# Patient Record
Sex: Female | Born: 1945
Health system: Southern US, Community
[De-identification: ages and names within clinical notes are randomized; demographics above are authoritative.]

## PROBLEM LIST (undated history)

## (undated) DIAGNOSIS — K5900 Constipation, unspecified: Secondary | ICD-10-CM

## (undated) DIAGNOSIS — N201 Calculus of ureter: Secondary | ICD-10-CM

## (undated) DIAGNOSIS — T8859XA Other complications of anesthesia, initial encounter: Secondary | ICD-10-CM

## (undated) DIAGNOSIS — F329 Major depressive disorder, single episode, unspecified: Secondary | ICD-10-CM

## (undated) DIAGNOSIS — J3089 Other allergic rhinitis: Secondary | ICD-10-CM

## (undated) DIAGNOSIS — K449 Diaphragmatic hernia without obstruction or gangrene: Secondary | ICD-10-CM

## (undated) DIAGNOSIS — Z800 Family history of malignant neoplasm of digestive organs: Secondary | ICD-10-CM

## (undated) DIAGNOSIS — M1990 Unspecified osteoarthritis, unspecified site: Secondary | ICD-10-CM

## (undated) DIAGNOSIS — J45909 Unspecified asthma, uncomplicated: Secondary | ICD-10-CM

## (undated) DIAGNOSIS — Z973 Presence of spectacles and contact lenses: Secondary | ICD-10-CM

## (undated) DIAGNOSIS — Z9889 Other specified postprocedural states: Secondary | ICD-10-CM

## (undated) DIAGNOSIS — R7303 Prediabetes: Secondary | ICD-10-CM

## (undated) DIAGNOSIS — T7840XA Allergy, unspecified, initial encounter: Secondary | ICD-10-CM

## (undated) DIAGNOSIS — Z87442 Personal history of urinary calculi: Secondary | ICD-10-CM

## (undated) DIAGNOSIS — F419 Anxiety disorder, unspecified: Secondary | ICD-10-CM

## (undated) DIAGNOSIS — F32A Depression, unspecified: Secondary | ICD-10-CM

## (undated) DIAGNOSIS — Z8041 Family history of malignant neoplasm of ovary: Secondary | ICD-10-CM

## (undated) DIAGNOSIS — T4145XA Adverse effect of unspecified anesthetic, initial encounter: Secondary | ICD-10-CM

## (undated) DIAGNOSIS — Z974 Presence of external hearing-aid: Secondary | ICD-10-CM

## (undated) DIAGNOSIS — N200 Calculus of kidney: Secondary | ICD-10-CM

## (undated) DIAGNOSIS — K219 Gastro-esophageal reflux disease without esophagitis: Secondary | ICD-10-CM

## (undated) DIAGNOSIS — R351 Nocturia: Secondary | ICD-10-CM

## (undated) DIAGNOSIS — R112 Nausea with vomiting, unspecified: Secondary | ICD-10-CM

## (undated) HISTORY — PX: DILATION AND CURETTAGE OF UTERUS: SHX78

## (undated) HISTORY — PX: CHOLECYSTECTOMY: SHX55

## (undated) HISTORY — PX: ANTERIOR CERVICAL DECOMP/DISCECTOMY FUSION: SHX1161

## (undated) HISTORY — PX: TONSILLECTOMY: SUR1361

## (undated) HISTORY — PX: SHOULDER SURGERY: SHX246

## (undated) HISTORY — PX: ABDOMINAL HYSTERECTOMY: SHX81

## (undated) HISTORY — PX: ELBOW SURGERY: SHX618

## (undated) HISTORY — PX: KNEE SURGERY: SHX244

## (undated) HISTORY — DX: Family history of malignant neoplasm of ovary: Z80.41

## (undated) HISTORY — PX: OTHER SURGICAL HISTORY: SHX169

## (undated) HISTORY — PX: LAPAROSCOPIC CHOLECYSTECTOMY: SUR755

## (undated) HISTORY — PX: SACROSPINOUS LIGAMENT FIXATION: SHX2371

## (undated) HISTORY — DX: Family history of malignant neoplasm of digestive organs: Z80.0

## (undated) HISTORY — PX: KNEE ARTHROSCOPY: SUR90

---
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
## 1998-08-07 HISTORY — PX: CARPAL TUNNEL RELEASE: SHX101

## 1999-07-28 ENCOUNTER — Other Ambulatory Visit: Admission: RE | Admit: 1999-07-28 | Discharge: 1999-07-28 | Payer: Self-pay | Admitting: Gynecology

## 2000-06-04 ENCOUNTER — Encounter (INDEPENDENT_AMBULATORY_CARE_PROVIDER_SITE_OTHER): Payer: Self-pay | Admitting: Specialist

## 2000-06-04 ENCOUNTER — Ambulatory Visit (HOSPITAL_COMMUNITY): Admission: RE | Admit: 2000-06-04 | Discharge: 2000-06-04 | Payer: Self-pay | Admitting: Gynecology

## 2001-01-14 ENCOUNTER — Other Ambulatory Visit: Admission: RE | Admit: 2001-01-14 | Discharge: 2001-01-14 | Payer: Self-pay | Admitting: Gynecology

## 2002-05-29 ENCOUNTER — Other Ambulatory Visit: Admission: RE | Admit: 2002-05-29 | Discharge: 2002-05-29 | Payer: Self-pay | Admitting: Gynecology

## 2002-07-29 ENCOUNTER — Encounter: Admission: RE | Admit: 2002-07-29 | Discharge: 2002-09-18 | Payer: Self-pay | Admitting: Orthopedic Surgery

## 2003-08-08 HISTORY — PX: OTHER SURGICAL HISTORY: SHX169

## 2003-08-08 HISTORY — PX: WRIST SURGERY: SHX841

## 2003-08-09 ENCOUNTER — Ambulatory Visit (HOSPITAL_COMMUNITY): Admission: RE | Admit: 2003-08-09 | Discharge: 2003-08-09 | Payer: Self-pay | Admitting: Family Medicine

## 2003-09-17 ENCOUNTER — Other Ambulatory Visit: Admission: RE | Admit: 2003-09-17 | Discharge: 2003-09-17 | Payer: Self-pay | Admitting: Gynecology

## 2004-12-26 ENCOUNTER — Other Ambulatory Visit: Admission: RE | Admit: 2004-12-26 | Discharge: 2004-12-26 | Payer: Self-pay | Admitting: Gynecology

## 2005-01-16 ENCOUNTER — Ambulatory Visit (HOSPITAL_COMMUNITY): Admission: RE | Admit: 2005-01-16 | Discharge: 2005-01-16 | Payer: Self-pay | Admitting: Gynecology

## 2005-01-16 ENCOUNTER — Encounter (INDEPENDENT_AMBULATORY_CARE_PROVIDER_SITE_OTHER): Payer: Self-pay | Admitting: *Deleted

## 2005-07-10 ENCOUNTER — Encounter: Admission: RE | Admit: 2005-07-10 | Discharge: 2005-10-08 | Payer: Self-pay | Admitting: Orthopedic Surgery

## 2005-10-09 ENCOUNTER — Encounter: Admission: RE | Admit: 2005-10-09 | Discharge: 2006-01-03 | Payer: Self-pay | Admitting: Orthopedic Surgery

## 2007-04-28 ENCOUNTER — Encounter: Admission: RE | Admit: 2007-04-28 | Discharge: 2007-04-28 | Payer: Self-pay | Admitting: Sports Medicine

## 2007-12-24 ENCOUNTER — Encounter: Admission: RE | Admit: 2007-12-24 | Discharge: 2008-01-15 | Payer: Self-pay | Admitting: Orthopedic Surgery

## 2008-04-09 ENCOUNTER — Encounter: Payer: Self-pay | Admitting: Sports Medicine

## 2008-04-15 ENCOUNTER — Ambulatory Visit: Payer: Self-pay | Admitting: Sports Medicine

## 2008-04-15 DIAGNOSIS — M7750 Other enthesopathy of unspecified foot: Secondary | ICD-10-CM | POA: Insufficient documentation

## 2008-04-15 DIAGNOSIS — M79609 Pain in unspecified limb: Secondary | ICD-10-CM | POA: Insufficient documentation

## 2008-04-16 ENCOUNTER — Encounter: Payer: Self-pay | Admitting: Sports Medicine

## 2008-08-07 HISTORY — PX: CERVICAL DISC SURGERY: SHX588

## 2009-07-19 ENCOUNTER — Encounter: Admission: RE | Admit: 2009-07-19 | Discharge: 2009-08-04 | Payer: Self-pay | Admitting: Chiropractic Medicine

## 2009-07-29 ENCOUNTER — Encounter: Admission: RE | Admit: 2009-07-29 | Discharge: 2009-07-29 | Payer: Self-pay | Admitting: Sports Medicine

## 2009-08-16 ENCOUNTER — Encounter: Admission: RE | Admit: 2009-08-16 | Discharge: 2009-08-16 | Payer: Self-pay | Admitting: Sports Medicine

## 2009-09-01 ENCOUNTER — Encounter: Admission: RE | Admit: 2009-09-01 | Discharge: 2009-09-01 | Payer: Self-pay | Admitting: Sports Medicine

## 2009-10-04 ENCOUNTER — Encounter: Admission: RE | Admit: 2009-10-04 | Discharge: 2009-10-04 | Payer: Self-pay | Admitting: Sports Medicine

## 2010-01-06 ENCOUNTER — Ambulatory Visit (HOSPITAL_COMMUNITY): Admission: RE | Admit: 2010-01-06 | Discharge: 2010-01-07 | Payer: Self-pay | Admitting: Neurosurgery

## 2010-01-10 ENCOUNTER — Encounter: Admission: RE | Admit: 2010-01-10 | Discharge: 2010-01-10 | Payer: Self-pay | Admitting: Orthopedic Surgery

## 2010-10-24 LAB — CBC
HCT: 40 % (ref 36.0–46.0)
Hemoglobin: 13.6 g/dL (ref 12.0–15.0)
MCHC: 34 g/dL (ref 30.0–36.0)
MCV: 89.6 fL (ref 78.0–100.0)
Platelets: 327 10*3/uL (ref 150–400)
RBC: 4.46 MIL/uL (ref 3.87–5.11)
RDW: 13.4 % (ref 11.5–15.5)
WBC: 5.7 10*3/uL (ref 4.0–10.5)

## 2010-10-24 LAB — SURGICAL PCR SCREEN
MRSA, PCR: NEGATIVE
Staphylococcus aureus: NEGATIVE

## 2010-12-23 NOTE — Op Note (Signed)
NAME:  Brenda Delgado, Brenda Delgado                ACCOUNT NO.:  [id number]   MEDICAL RECORD NO.:  [id number]          PATIENT TYPE:  AMB   LOCATION:  SDC                           FACILITY:  [facility]   PHYSICIAN:  Luvenia Redden, M.D.   DATE OF BIRTH:  [birth year]   DATE OF PROCEDURE:  01/16/2005  DATE OF DISCHARGE:                                 OPERATIVE REPORT   PREOPERATIVE DIAGNOSIS:  Multiple sebaceous cysts of the vulva.   POSTOPERATIVE DIAGNOSIS:  Multiple sebaceous cysts of the vulva.   PROCEDURE:  Excision of sebaceous cysts from the vulva bilaterally.   SURGEON:  Luvenia Redden, M.D.   DESCRIPTION OF PROCEDURE:  Under good anesthesia, the patient was prepped  and draped in the usual sterile fashion. She had multiple sebaceous cysts,  the largest was on the left vulva, on the labia majora and then she had two  or three more on the right side, the largest of which was about 1 cm.  The  largest on the left side was at least 2.5 cm.  ON the left side, there was a  total of about four of these things that were incised in toto.  The  subcutaneous tissues were infiltrated with some lidocaine with epinephrine  and the area encompassing the cysts was excised elliptically, carried down  to the subcutaneous tissues, and then this was removed.  Some bleeders were  cauterized on the left side.  There was some electrocoagulation.  The defect  was closed then with interrupted sutures of 4-0 Vicryl.  Attention was then  directed to the right side, where again the vulva was infiltrated.  An area  where the cysts were.  Again elliptical incision to include the area  containing the cysts was made and this area was excised down to the  subcutaneous tissue.  The defect was then closed with interrupted 4-0 Vicryl  sutures.  There were a couple of smaller ones that were just excised from  the bed, cauterized with electrocautery, one on each side, one on the right  and one on the left.  Estimated blood loss was 25  mL or less, none was  replaced.  The patient tolerated the procedure well and she is removed to  the recovery room in good condition.       WSB/MEDQ  D:  01/16/2005  T:  01/16/2005  Job:  [id number]

## 2011-06-12 ENCOUNTER — Other Ambulatory Visit: Payer: Self-pay | Admitting: Gynecology

## 2011-08-28 ENCOUNTER — Encounter (HOSPITAL_COMMUNITY): Payer: Self-pay | Admitting: Pharmacist

## 2011-09-07 ENCOUNTER — Encounter (HOSPITAL_COMMUNITY): Payer: Self-pay

## 2011-09-07 ENCOUNTER — Other Ambulatory Visit: Payer: Self-pay | Admitting: Obstetrics and Gynecology

## 2011-09-07 ENCOUNTER — Other Ambulatory Visit: Payer: Self-pay

## 2011-09-07 ENCOUNTER — Encounter (HOSPITAL_COMMUNITY)
Admission: RE | Admit: 2011-09-07 | Discharge: 2011-09-07 | Disposition: A | Discharge status: Home / Self Care | Payer: Medicare Other | Source: Ambulatory Visit | Attending: Obstetrics and Gynecology | Admitting: Obstetrics and Gynecology

## 2011-09-07 HISTORY — DX: Other specified postprocedural states: R11.2

## 2011-09-07 HISTORY — DX: Other specified postprocedural states: Z98.890

## 2011-09-07 LAB — CBC
MCH: 30.4 pg (ref 26.0–34.0)
MCHC: 32.7 g/dL (ref 30.0–36.0)
MCV: 93 fL (ref 78.0–100.0)
Platelets: 273 10*3/uL (ref 150–400)
RBC: 4.74 MIL/uL (ref 3.87–5.11)

## 2011-09-07 NOTE — Patient Instructions (Addendum)
   Your procedure is scheduled WU:[id number] Feb 4th  Enter through the Main Entrance of [facility] at: 6am Pick up the phone at the desk and dial [phone] and inform us of your arrival.  Please call this number if you have any problems the morning of surgery: [phone]  Remember: Do not eat food after midnight:t Sunday Do not drink clear liquids after:midnight Sunday Take these medicines the morning of surgery with a SIP OF WATER:morning medications  Do not wear jewelry, make-up, or FINGER nail polish Do not wear lotions, powders, perfumes or deodorant. Do not shave 48 hours prior to surgery. Do not bring valuables to the hospital.  Leave suitcase in the car. After Surgery it may be brought to your room. For patients being admitted to the hospital, checkout time is 11:00am the day of discharge.  Patients discharged on the day of surgery will not be allowed to drive home.     Remember to use your hibiclens as instructed.Please shower with 1/2 bottle the evening before your surgery and the other 1/2 bottle the morning of surgery.

## 2011-09-09 NOTE — H&P (Signed)
NAME:  Brenda Delgado, Brenda Delgado NO.:  [id number]  MEDICAL RECORD NO.:  [id number]  LOCATION:  PERIO                         FACILITY:  [facility]  PHYSICIAN:  Malva Limes, M.D.    DATE OF BIRTH:  March 05, 1946  DATE OF ADMISSION:  08/17/2011 DATE OF DISCHARGE:                             HISTORY & PHYSICAL   HISTORY OF PRESENT ILLNESS:  Brenda Delgado is a 66 year old, G2, P2, white female, who presents to [facility] for a total vaginal hysterectomy with bilateral salpingo-oophorectomy and an anterior colporrhaphy secondary to symptomatic uterine prolapse and large cystocele.  The patient states that she has had increasing pelvic pressure over the last 3 years.  The patient also states that she has occasional stress urine incontinence.  She has urinary urgency and nocturia.  Prior to having this procedure performed, the patient did undergo cystometrics during that procedure the patient did not have stress urinary incontinence.  She was found to have bladder irritability and abnormal emptying of the bladder after voiding.  The patient was given a trial of Enablex approximately 2 weeks prior to this procedure which helped with the nocturia and she noticed some improvement in the urge incontinence.  The patient does state that she rarely leaks with Valsalva given the greater urge incontinence, it was felt urethral sling would not be indicated in this patient.  PAST MEDICAL HISTORY:  The patient has an allergy to AUGMENTIN and CODEINE which causes nausea.  She denies smoking, alcohol, or drug use.  CURRENT MEDICATIONS:  Pravastatin and calcium.  PAST SURGERIES:  Cholecystectomy and 2 vaginal births.  FAMILY HISTORY:  Significant for colon carcinoma and  breast carcinoma.  PHYSICAL EXAM:  GENERAL:  The patient is a thin white female in no apparent distress. VITAL SIGNS:  Stable.  She is afebrile. HEENT:  Within normal limits. LUNGS:  Clear to auscultation. CARDIOVASCULAR:   Regular rate and rhythm without murmurs. BREASTS:  Without masses or tenderness.  She is up-to-date on her mammogram and Pap smear.  ABDOMEN:  Soft, nontender, and no organomegaly.  She has no rebound or guarding. PELVIC:  Atrophic external genitalia.  Vagina has a large cystocele, posteriorly  the vagina appeared to be normal.  Cervix is parous. Uterus is small with first-degree prolapse. RECTAL:  Within normal limits.  IMPRESSION: 1. Symptomatic uterine prolapse. 2. Symptomatic cystocele.  PLAN:  Proceed with total vaginal hysterectomy bilateral salpingo- oophorectomy and anterior colporrhaphy.          ______________________________ Malva Limes, M.D.     MA/MEDQ  D:  09/08/2011  T:  09/09/2011  Job:  [id number]

## 2011-09-10 MED ORDER — DEXTROSE 5 % IV SOLN
1.0000 g | INTRAVENOUS | Status: AC
  Administered 2011-09-11: 1 g via INTRAVENOUS
  Filled 2011-09-10: qty 1

## 2011-09-11 ENCOUNTER — Encounter (HOSPITAL_COMMUNITY): Payer: Self-pay | Admitting: Anesthesiology

## 2011-09-11 ENCOUNTER — Ambulatory Visit (HOSPITAL_COMMUNITY): Payer: Medicare Other | Admitting: Anesthesiology

## 2011-09-11 ENCOUNTER — Encounter (HOSPITAL_COMMUNITY)
Admission: RE | Disposition: A | Discharge status: Home / Self Care | Payer: Self-pay | Source: Ambulatory Visit | Attending: Obstetrics and Gynecology

## 2011-09-11 ENCOUNTER — Inpatient Hospital Stay (HOSPITAL_COMMUNITY)
Admission: RE | Admit: 2011-09-11 | Discharge: 2011-09-13 | DRG: 743 | Disposition: A | Discharge status: Home / Self Care | Payer: Medicare Other | Source: Ambulatory Visit | Attending: Obstetrics and Gynecology | Admitting: Obstetrics and Gynecology

## 2011-09-11 ENCOUNTER — Other Ambulatory Visit: Payer: Self-pay | Admitting: Obstetrics and Gynecology

## 2011-09-11 DIAGNOSIS — Z01818 Encounter for other preprocedural examination: Secondary | ICD-10-CM

## 2011-09-11 DIAGNOSIS — N812 Incomplete uterovaginal prolapse: Principal | ICD-10-CM | POA: Diagnosis present

## 2011-09-11 DIAGNOSIS — N8000 Endometriosis of the uterus, unspecified: Secondary | ICD-10-CM | POA: Diagnosis present

## 2011-09-11 DIAGNOSIS — D251 Intramural leiomyoma of uterus: Secondary | ICD-10-CM | POA: Diagnosis present

## 2011-09-11 DIAGNOSIS — D250 Submucous leiomyoma of uterus: Secondary | ICD-10-CM | POA: Diagnosis present

## 2011-09-11 DIAGNOSIS — Z01812 Encounter for preprocedural laboratory examination: Secondary | ICD-10-CM

## 2011-09-11 HISTORY — PX: VULVA /PERINEUM BIOPSY: SHX319

## 2011-09-11 HISTORY — PX: SALPINGOOPHORECTOMY: SHX82

## 2011-09-11 HISTORY — PX: CYSTOCELE REPAIR: SHX163

## 2011-09-11 HISTORY — PX: VAGINAL HYSTERECTOMY: SHX2639

## 2011-09-11 HISTORY — PX: CYSTOSCOPY: SHX5120

## 2011-09-11 LAB — COMPREHENSIVE METABOLIC PANEL
Alkaline Phosphatase: 77 U/L (ref 39–117)
BUN: 13 mg/dL (ref 6–23)
CO2: 31 mEq/L (ref 19–32)
Chloride: 102 mEq/L (ref 96–112)
Creatinine, Ser: 0.58 mg/dL (ref 0.50–1.10)
GFR calc Af Amer: >90 mL/min (ref >90)
GFR calc non Af Amer: >90 mL/min (ref >90)
Glucose, Bld: 114 mg/dL — ABNORMAL HIGH (ref 70–99)
Potassium: 3.9 mEq/L (ref 3.5–5.1)
Total Bilirubin: 0.5 mg/dL (ref 0.3–1.2)

## 2011-09-11 SURGERY — HYSTERECTOMY, VAGINAL
Anesthesia: General | Site: Vulva | Wound class: Clean Contaminated

## 2011-09-11 MED ORDER — ROCURONIUM BROMIDE 100 MG/10ML IV SOLN
INTRAVENOUS | Status: DC | PRN
  Administered 2011-09-11: 40 mg via INTRAVENOUS

## 2011-09-11 MED ORDER — INDIGOTINDISULFONATE SODIUM 8 MG/ML IJ SOLN
INTRAMUSCULAR | Status: AC
  Filled 2011-09-11: qty 5

## 2011-09-11 MED ORDER — DEXAMETHASONE SODIUM PHOSPHATE 4 MG/ML IJ SOLN
INTRAMUSCULAR | Status: DC | PRN
  Administered 2011-09-11: 10 mg via INTRAVENOUS

## 2011-09-11 MED ORDER — NALOXONE HCL 0.4 MG/ML IJ SOLN: 0.4000 mg | INTRAMUSCULAR | Status: DC | PRN

## 2011-09-11 MED ORDER — DIPHENHYDRAMINE HCL 12.5 MG/5ML PO ELIX
12.5000 mg | ORAL_SOLUTION | Freq: Four times a day (QID) | ORAL | Status: DC | PRN
  Filled 2011-09-11: qty 5

## 2011-09-11 MED ORDER — OXYCODONE-ACETAMINOPHEN 5-325 MG PO TABS
1.0000 | ORAL_TABLET | ORAL | Status: DC | PRN
  Administered 2011-09-12 – 2011-09-13 (×5): 1 via ORAL
  Filled 2011-09-11 (×5): qty 1

## 2011-09-11 MED ORDER — LIDOCAINE-EPINEPHRINE 1 %-1:100000 IJ SOLN
INTRAMUSCULAR | Status: DC | PRN
  Administered 2011-09-11: 14 mL

## 2011-09-11 MED ORDER — HYDROMORPHONE HCL PF 1 MG/ML IJ SOLN: 0.2500 mg | INTRAMUSCULAR | Status: DC | PRN

## 2011-09-11 MED ORDER — MIDAZOLAM HCL 5 MG/5ML IJ SOLN
INTRAMUSCULAR | Status: DC | PRN
  Administered 2011-09-11 (×2): 1 mg via INTRAVENOUS

## 2011-09-11 MED ORDER — KETOROLAC TROMETHAMINE 30 MG/ML IJ SOLN
INTRAMUSCULAR | Status: AC
  Filled 2011-09-11: qty 1

## 2011-09-11 MED ORDER — KETOROLAC TROMETHAMINE 30 MG/ML IJ SOLN: 30.0000 mg | Freq: Once | INTRAMUSCULAR | Status: DC

## 2011-09-11 MED ORDER — LIDOCAINE HCL (CARDIAC) 20 MG/ML IV SOLN
INTRAVENOUS | Status: DC | PRN
  Administered 2011-09-11: 50 mg via INTRAVENOUS

## 2011-09-11 MED ORDER — DEXTROSE IN LACTATED RINGERS 5 % IV SOLN
INTRAVENOUS | Status: DC
  Administered 2011-09-11 – 2011-09-12 (×3): via INTRAVENOUS

## 2011-09-11 MED ORDER — ESTRADIOL 0.1 MG/GM VA CREA
TOPICAL_CREAM | VAGINAL | Status: DC | PRN
  Administered 2011-09-11: 1 via VAGINAL

## 2011-09-11 MED ORDER — LIDOCAINE HCL (CARDIAC) 20 MG/ML IV SOLN
INTRAVENOUS | Status: AC
  Filled 2011-09-11: qty 5

## 2011-09-11 MED ORDER — HYDROMORPHONE 0.3 MG/ML IV SOLN
INTRAVENOUS | Status: AC
  Administered 2011-09-12: 0.599 mg via INTRAVENOUS
  Filled 2011-09-11: qty 25

## 2011-09-11 MED ORDER — FENTANYL CITRATE 0.05 MG/ML IJ SOLN
INTRAMUSCULAR | Status: DC | PRN
  Administered 2011-09-11: 150 ug via INTRAVENOUS
  Administered 2011-09-11: 100 ug via INTRAVENOUS

## 2011-09-11 MED ORDER — ONDANSETRON HCL 4 MG/2ML IJ SOLN
INTRAMUSCULAR | Status: DC | PRN
  Administered 2011-09-11: 4 mg via INTRAVENOUS

## 2011-09-11 MED ORDER — PAROXETINE HCL 30 MG PO TABS
15.0000 mg | ORAL_TABLET | Freq: Every day | ORAL | Status: DC
  Administered 2011-09-12 – 2011-09-13 (×2): 15 mg via ORAL
  Filled 2011-09-11 (×4): qty 0.5

## 2011-09-11 MED ORDER — DIPHENHYDRAMINE HCL 50 MG/ML IJ SOLN: 12.5000 mg | Freq: Four times a day (QID) | INTRAMUSCULAR | Status: DC | PRN

## 2011-09-11 MED ORDER — PROPOFOL 10 MG/ML IV EMUL
INTRAVENOUS | Status: DC | PRN
  Administered 2011-09-11: 100 mg via INTRAVENOUS
  Administered 2011-09-11: 20 mg via INTRAVENOUS

## 2011-09-11 MED ORDER — LACTATED RINGERS IV SOLN
INTRAVENOUS | Status: DC
  Administered 2011-09-11: 08:00:00 via INTRAVENOUS
  Administered 2011-09-11 (×2): 125 mL/h via INTRAVENOUS

## 2011-09-11 MED ORDER — HYDROMORPHONE HCL PF 1 MG/ML IJ SOLN
INTRAMUSCULAR | Status: AC
  Filled 2011-09-11: qty 1

## 2011-09-11 MED ORDER — INDIGOTINDISULFONATE SODIUM 8 MG/ML IJ SOLN
INTRAMUSCULAR | Status: DC | PRN
  Administered 2011-09-11: 40 mg via INTRAVENOUS

## 2011-09-11 MED ORDER — HYDROMORPHONE HCL PF 1 MG/ML IJ SOLN
INTRAMUSCULAR | Status: DC | PRN
  Administered 2011-09-11: 1 mg via INTRAVENOUS

## 2011-09-11 MED ORDER — ONDANSETRON HCL 4 MG/2ML IJ SOLN: 4.0000 mg | Freq: Four times a day (QID) | INTRAMUSCULAR | Status: DC | PRN

## 2011-09-11 MED ORDER — PANTOPRAZOLE SODIUM 40 MG PO TBEC
40.0000 mg | DELAYED_RELEASE_TABLET | Freq: Every day | ORAL | Status: DC
  Administered 2011-09-13: 40 mg via ORAL
  Filled 2011-09-11 (×4): qty 1

## 2011-09-11 MED ORDER — DEXAMETHASONE SODIUM PHOSPHATE 10 MG/ML IJ SOLN
INTRAMUSCULAR | Status: AC
  Filled 2011-09-11: qty 1

## 2011-09-11 MED ORDER — ONDANSETRON HCL 4 MG/2ML IJ SOLN
INTRAMUSCULAR | Status: AC
  Filled 2011-09-11: qty 2

## 2011-09-11 MED ORDER — SODIUM CHLORIDE 0.9 % IJ SOLN: 9.0000 mL | INTRAMUSCULAR | Status: DC | PRN

## 2011-09-11 MED ORDER — HYDROMORPHONE 0.3 MG/ML IV SOLN
INTRAVENOUS | Status: DC
  Administered 2011-09-11: 11:00:00 via INTRAVENOUS
  Administered 2011-09-11: 0.67 mL via INTRAVENOUS
  Administered 2011-09-11: 2.67 mg via INTRAVENOUS
  Administered 2011-09-12: 1.33 mL via INTRAVENOUS
  Administered 2011-09-12: 0.67 mL via INTRAVENOUS

## 2011-09-11 MED ORDER — DOCUSATE SODIUM 100 MG PO CAPS
100.0000 mg | ORAL_CAPSULE | Freq: Two times a day (BID) | ORAL | Status: DC
  Administered 2011-09-11 – 2011-09-13 (×4): 100 mg via ORAL
  Filled 2011-09-11 (×4): qty 1

## 2011-09-11 MED ORDER — STERILE WATER FOR IRRIGATION IR SOLN
Status: DC | PRN
  Administered 2011-09-11: 1000 mL via INTRAVESICAL

## 2011-09-11 MED ORDER — ROCURONIUM BROMIDE 50 MG/5ML IV SOLN
INTRAVENOUS | Status: AC
  Filled 2011-09-11: qty 1

## 2011-09-11 MED ORDER — MIDAZOLAM HCL 2 MG/2ML IJ SOLN
INTRAMUSCULAR | Status: AC
  Filled 2011-09-11: qty 2

## 2011-09-11 MED ORDER — FENTANYL CITRATE 0.05 MG/ML IJ SOLN
INTRAMUSCULAR | Status: AC
  Filled 2011-09-11: qty 5

## 2011-09-11 MED ORDER — SIMETHICONE 80 MG PO CHEW: 80.0000 mg | CHEWABLE_TABLET | Freq: Four times a day (QID) | ORAL | Status: DC | PRN

## 2011-09-11 MED ORDER — PROPOFOL 10 MG/ML IV EMUL
INTRAVENOUS | Status: AC
  Filled 2011-09-11: qty 20

## 2011-09-11 MED ORDER — ESTRADIOL 0.1 MG/GM VA CREA
TOPICAL_CREAM | VAGINAL | Status: AC
  Filled 2011-09-11: qty 42.5

## 2011-09-11 SURGICAL SUPPLY — 44 items
BLADE SURG 15 STRL LF C SS BP (BLADE) ×1 IMPLANT
BLADE SURG 15 STRL SS (BLADE) ×6
CANISTER SUCTION 2500CC (MISCELLANEOUS) ×6 IMPLANT
CATH BONANNO SUPRAPUBIC 14G (CATHETERS) IMPLANT
CATH FOLEY 2WAY SLVR  5CC 18FR (CATHETERS)
CATH FOLEY 2WAY SLVR 5CC 18FR (CATHETERS) ×4 IMPLANT
CATH ROBINSON RED A/P 16FR (CATHETERS) IMPLANT
CLOTH BEACON ORANGE TIMEOUT ST (SAFETY) ×6 IMPLANT
CONT PATH 16OZ SNAP LID 3702 (MISCELLANEOUS) ×2 IMPLANT
CONTAINER PREFILL 10% NBF 60ML (FORM) ×6 IMPLANT
DECANTER SPIKE VIAL GLASS SM (MISCELLANEOUS) ×2 IMPLANT
DRAPE HYSTEROSCOPY (DRAPE) ×6 IMPLANT
GAUZE PACKING 2X5 YD STERILE (GAUZE/BANDAGES/DRESSINGS) ×2 IMPLANT
GAUZE PACKING IODOFORM 2 (PACKING) IMPLANT
GLOVE ECLIPSE 7.0 STRL STRAW (GLOVE) ×12 IMPLANT
GOWN PREVENTION PLUS LG XLONG (DISPOSABLE) ×18 IMPLANT
GOWN PREVENTION PLUS XLARGE (GOWN DISPOSABLE) ×6 IMPLANT
GOWN STRL REIN XL XLG (GOWN DISPOSABLE) ×6 IMPLANT
NDL SPNL 18GX3.5 QUINCKE PK (NEEDLE) ×4 IMPLANT
NDL SPNL 22GX3.5 QUINCKE BK (NEEDLE) IMPLANT
NEEDLE HYPO 22GX1.5 SAFETY (NEEDLE) IMPLANT
NEEDLE SPNL 18GX3.5 QUINCKE PK (NEEDLE) ×6 IMPLANT
NEEDLE SPNL 22GX3.5 QUINCKE BK (NEEDLE) IMPLANT
NS IRRIG 1000ML POUR BTL (IV SOLUTION) ×6 IMPLANT
PACK VAGINAL WOMENS (CUSTOM PROCEDURE TRAY) ×6 IMPLANT
SET CYSTO W/LG BORE CLAMP LF (SET/KITS/TRAYS/PACK) ×6 IMPLANT
SPONGE LAP 4X18 X RAY DECT (DISPOSABLE) ×6 IMPLANT
SUT MNCRL 0 MO-4 VIOLET 18 CR (SUTURE) ×15 IMPLANT
SUT MNCRL 0 VIOLET 6X18 (SUTURE) ×5 IMPLANT
SUT MONOCRYL 0 6X18 (SUTURE) ×1
SUT MONOCRYL 0 MO 4 18  CR/8 (SUTURE) ×3
SUT VIC AB 0 CT1 27 (SUTURE) ×12
SUT VIC AB 0 CT1 27XBRD ANBCTR (SUTURE) ×10 IMPLANT
SUT VIC AB 2-0 CT1 27 (SUTURE) ×24
SUT VIC AB 2-0 CT1 TAPERPNT 27 (SUTURE) ×16 IMPLANT
SUT VIC AB 2-0 CTB1 (SUTURE) ×6 IMPLANT
SUT VIC AB 2-0 UR5 27 (SUTURE) IMPLANT
SUT VIC AB 2-0 UR6 27 (SUTURE) IMPLANT
SUT VICRYL 0 UR6 27IN ABS (SUTURE) IMPLANT
SUT VICRYL RAPIDE 3 0 (SUTURE) IMPLANT
SYR 20CC LL (SYRINGE) ×6 IMPLANT
TOWEL OR 17X24 6PK STRL BLUE (TOWEL DISPOSABLE) ×12 IMPLANT
TRAY FOLEY CATH 14FR (SET/KITS/TRAYS/PACK) ×6 IMPLANT
WATER STERILE IRR 1000ML POUR (IV SOLUTION) ×4 IMPLANT

## 2011-09-11 NOTE — Anesthesia Preprocedure Evaluation (Signed)
Anesthesia Evaluation  Patient identified by MRN, date of birth, ID band Patient awake    Reviewed: Allergy & Precautions, H&P , Patient's Chart, lab work & pertinent test results, reviewed documented beta blocker date and time   History of Anesthesia Complications (+) PONV  Airway Mallampati: II TM Distance: >3 FB Neck ROM: full    Dental No notable dental hx. (+)    Pulmonary  clear to auscultation  Pulmonary exam normal       Cardiovascular regular Normal    Neuro/Psych    GI/Hepatic GERD-  Medicated and Controlled,  Endo/Other    Renal/GU      Musculoskeletal   Abdominal   Peds  Hematology   Anesthesia Other Findings   Reproductive/Obstetrics                           Anesthesia Physical Anesthesia Plan  ASA: II  Anesthesia Plan: General   Post-op Pain Management:    Induction: Intravenous  Airway Management Planned: Oral ETT  Additional Equipment:   Intra-op Plan:   Post-operative Plan:   Informed Consent: I have reviewed the patients History and Physical, chart, labs and discussed the procedure including the risks, benefits and alternatives for the proposed anesthesia with the patient or authorized representative who has indicated his/her understanding and acceptance.   Dental Advisory Given and Dental advisory given  Plan Discussed with: CRNA and Surgeon  Anesthesia Plan Comments: (  Discussed  general anesthesia, including possible nausea, instrumentation of airway, sore throat,pulmonary aspiration, etc. I asked if the were any outstanding questions, or  concerns before we proceeded. )        Anesthesia Quick Evaluation

## 2011-09-11 NOTE — Anesthesia Postprocedure Evaluation (Signed)
  Anesthesia Post-op Note  Patient: Brenda Delgado  Procedure(s) Performed:  HYSTERECTOMY VAGINAL; SALPINGO OOPHERECTOMY; ANTERIOR REPAIR (CYSTOCELE); VULVAR BIOPSY - sebaceous cyst excision left vula; CYSTOSCOPY  Patient Location: [facility]  Anesthesia Type: General  Level of Consciousness: awake, oriented and patient cooperative  Airway and Oxygen Therapy: Patient Spontanous Breathing  Post-op Pain: mild  Post-op Assessment: Patient's Cardiovascular Status Stable, Respiratory Function Stable, No signs of Nausea or vomiting, Adequate PO intake and Pain level controlled  Post-op Vital Signs: Reviewed and stable  Complications: No apparent anesthesia complications

## 2011-09-11 NOTE — Transfer of Care (Signed)
Immediate Anesthesia Transfer of Care Note  Patient: Brenda Delgado  Procedure(s) Performed:  HYSTERECTOMY VAGINAL; SALPINGO OOPHERECTOMY; ANTERIOR REPAIR (CYSTOCELE); VULVAR BIOPSY - sebaceous cyst excision left vula; CYSTOSCOPY  Patient Location: PACU  Anesthesia Type: General  Level of Consciousness: awake and sedated  Airway & Oxygen Therapy: Patient Spontanous Breathing and Patient connected to nasal cannula oxygen  Post-op Assessment: Report given to PACU RN and Post -op Vital signs reviewed and stable  Post vital signs: Reviewed and stable  Complications: No apparent anesthesia complications

## 2011-09-11 NOTE — Addendum Note (Signed)
Addendum  created 09/11/11 1731 by Suella Grove, CRNA   Modules edited:Notes Section

## 2011-09-11 NOTE — Progress Notes (Signed)
Discussed surgery with patient. No change is history or PE.

## 2011-09-11 NOTE — Anesthesia Procedure Notes (Signed)
Procedure Name: Intubation Date/Time: 09/11/2011 7:32 AM Performed by: Isabella Bowens Pre-anesthesia Checklist: Patient identified, Emergency Drugs available, Suction available, Patient being monitored and Timeout performed Patient Re-evaluated:Patient Re-evaluated prior to inductionOxygen Delivery Method: Circle System Utilized Preoxygenation: Pre-oxygenation with 100% oxygen Intubation Type: IV induction Ventilation: Mask ventilation without difficulty Laryngoscope Size: Mac and 3 Grade View: Grade II Tube size: 7.0 mm Number of attempts: 1 Airway Equipment and Method: stylet Placement Confirmation: ETT inserted through vocal cords under direct vision,  positive ETCO2,  CO2 detector and breath sounds checked- equal and bilateral Secured at: 20 cm Tube secured with: Tape Dental Injury: Teeth and Oropharynx as per pre-operative assessment  Difficulty Due To: Difficulty was unanticipated

## 2011-09-11 NOTE — Anesthesia Postprocedure Evaluation (Signed)
Anesthesia Post Note  Patient: Brenda Delgado  Procedure(s) Performed:  HYSTERECTOMY VAGINAL; SALPINGO OOPHERECTOMY; ANTERIOR REPAIR (CYSTOCELE); VULVAR BIOPSY - sebaceous cyst excision left vula; CYSTOSCOPY  Anesthesia type: General  Patient location: PACU  Post pain: Pain level controlled  Post assessment: Post-op Vital signs reviewed  Last Vitals:  Filed Vitals:   09/11/11 0917  BP: 133/61  Pulse: 93  Temp: 37.1 C  Resp: 12    Post vital signs: Reviewed  Level of consciousness: sedated  Complications: No apparent anesthesia complicationsfj

## 2011-09-12 ENCOUNTER — Encounter (HOSPITAL_COMMUNITY): Payer: Self-pay | Admitting: Obstetrics and Gynecology

## 2011-09-12 LAB — CBC
HCT: 37.7 % (ref 36.0–46.0)
MCV: 93.1 fL (ref 78.0–100.0)
Platelets: 264 10*3/uL (ref 150–400)
RBC: 4.05 MIL/uL (ref 3.87–5.11)
WBC: 8.7 10*3/uL (ref 4.0–10.5)

## 2011-09-12 MED ORDER — BETHANECHOL CHLORIDE 10 MG PO TABS
10.0000 mg | ORAL_TABLET | Freq: Three times a day (TID) | ORAL | Status: DC
  Administered 2011-09-12 – 2011-09-13 (×2): 10 mg via ORAL
  Filled 2011-09-12 (×6): qty 1

## 2011-09-12 NOTE — Op Note (Signed)
Brenda Delgado, Brenda Delgado NO.:  [id number]  MEDICAL RECORD NO.:  [id number]  LOCATION:  9320                          FACILITY:  [facility]  PHYSICIAN:  Malva Limes, M.D.    DATE OF BIRTH:  1945/08/17  DATE OF PROCEDURE:  09/11/2011 DATE OF DISCHARGE:                              OPERATIVE REPORT   PREOPERATIVE DIAGNOSES: 1. Primary uterine prolapse 2. Symptomatic cystocele.  POSTOPERATIVE DIAGNOSES: 1. Primary uterine prolapse. 2. Symptomatic cystocele.  PROCEDURE: 1. Total vaginal hysterectomy with bilateral salpingo-oophorectomy. 2. Anterior colporrhaphy. 3. Kelly plication. 4. Cystoscopy.  SURGEON:  Malva Limes, M.D.  ASSISTANT:  Luvenia Redden, M.D.  ANESTHESIA:  General.  ANTIBIOTICS:  Cefotan 1 g.  DRAINS:  Foley bedside drainage.  ESTIMATED BLOOD LOSS:  100 mL.  COMPLICATIONS:  None.  SPECIMENS:  Cervix, uterus, fallopian tubes, and ovaries sent to pathology.  PROCEDURE:  The patient was taken to the operating room where general anesthetic was administered without difficulty.  She was placed in dorsal lithotomy position.  She was prepped and draped in the usual fashion for this procedure and exam under anesthesia revealed a large cystocele, small uterus.  No pelvic masses.  At this point, the Foley catheter was placed in the bladder.  A weighted speculum was placed in the vagina.  A 20 mL of 1% lidocaine was injected circumferentially around the cervix.  At this point, the posterior cul-de-sac was entered sharply.  The uterosacral ligaments were bilaterally clamped, cut, and ligated with 0 Monocryl suture.  The cervix was circumscribed.  The anterior cul-de-sac was entered sharply.  Bladder pillars were bilaterally clamped, cut, and ligated with 0 Monocryl suture.  The cardinal ligaments were serially clamped, cut, and ligated with 0 Monocryl suture.  The uterine vessels were bilaterally clamped, cut, and ligated with 0 Monocryl suture.   Next the round ligament, fallopian tube, and ovary were bilaterally clamped, cut, and ligated x2 with 0 Monocryl suture.  Next ovaries and fallopian tubes were assessed.  It was felt that these could be removed vaginally.  A clamp was placed superior to the ovary on the infundibulopelvic ligament.  The ovary and fallopian tube removed.  This pedicle was doubly ligated with 0 Monocryl suture.  A similar procedure was performed on the opposite side.  At this point, all pedicles were checked and felt to be hemostatic.  The posterior cuff was then run between the uterosacral ligaments using 2-0 Vicryl in a running, locking fashion.  Uterosacral suspension suture was placed at this point, but not ligated.  At this point, the anterior vaginal cuff was grasped.  The anterior vaginal wall was injected with 1% lidocaine with epinephrine.  The anterior vaginal wall was opened vertically from the top of the cuff to the mid urethra.  The large cystocele was dissected away from the vaginal mucosa.  At this point, large cystocele was reduced using 2-0 Vicryl and a pursestring suture. Following this, small Kelly plication was performed.  The excess vaginal mucosa was then removed.  The vaginal cuff was then closed using 2-0 Vicryl in a running, locking fashion.  The remaining vaginal cuff was then closed using 2-0 Vicryl in  a running, locking fashion.  Cuff was closed vertically.  The vagina was then packed using plain gauze with Estrace cream.  At this point, cystoscopy was performed.  Bladder appeared to have no foreign materials in the wall.  The ureters were both seen with dye coming from both ureters.  The cystoscope was then removed and the Foley catheter replaced.  The patient was awakened and taken to recovery room in stable condition.  Instrument and lap count were correct x2.          ______________________________ Malva Limes, M.D.     MA/MEDQ  D:  09/11/2011  T:  09/12/2011  Job:   [id number]

## 2011-09-12 NOTE — Progress Notes (Signed)
UR Chart review completed.  

## 2011-09-13 MED ORDER — OXYCODONE-ACETAMINOPHEN 5-325 MG PO TABS: 1.0000 | ORAL_TABLET | ORAL | Status: AC | PRN

## 2011-09-13 NOTE — Progress Notes (Signed)
Pt ambulated out with husband  Teaching complete  Voiding well witout problems

## 2011-09-13 NOTE — Progress Notes (Signed)
POD#2 Pt had large PVRs yesterday.  Was given Urecholine. All PVRs today are less than 25cc. Pt tolerating diet, ambulating. VSSAF Imp/ Doing well Plan/ Will discharge to home. FU in 2 weeks. Percocet prn

## 2011-09-14 NOTE — Discharge Summary (Signed)
Brenda Delgado, Brenda Delgado NO.:  [id number]  MEDICAL RECORD NO.:  [id number]  LOCATION:  9320                          FACILITY:  [facility]  PHYSICIAN:  Malva Limes, M.D.    DATE OF BIRTH:  07/23/1946  DATE OF ADMISSION:  09/11/2011 DATE OF DISCHARGE:  09/13/2011                              DISCHARGE SUMMARY   PREOPERATIVE DIAGNOSES: 1. Symptomatic uterine prolapse. 2. Cystocele.  PRINCIPLE PROCEDURES: 1. Total vaginal hysterectomy with bilateral salpingo-oophorectomy. 2. Anterior colporrhaphy. 3. Kelly plication. 4. Cystoscopy.  HISTORY OF PRESENT ILLNESS:  [name] is a 66 year old white female, who presented to [facility] on September 11, 2011 to undergo a total vaginal hysterectomy with bilateral salpingo-oophorectomy and anterior colporrhaphy, secondary to worsening symptomatic uterine prolapse. Prior to having a procedure performed, the patient did undergo a cystometric study, which indicated the patient had urge incontinence. Despite multiple attempts, we could not elicit stress urinary incontinence.  Prior to hospitalization, the patient had been taking Enablex, which she stated had improved some of her nocturia and frequency.  HOSPITAL COURSE:  The patient underwent a total vaginal hysterectomy with bilateral salpingo-oophorectomy, anterior colporrhaphy, and Kelly plication.  A complete description of this can be found in dictated operative note.  The patient did well postoperatively.  She remained afebrile.  She was eating a regular diet, and ambulating without difficulty.  The patient initially had a large postvoid residuals on postop day #1, however, she was given some Urecholine and this improved. At the time of discharge, her postvoid residuals were less than 25 mL. The patient was discharged to home.  She was sent home with Percocet to take p.r.n.  She was told to follow up in the office in 2 weeks.  She was told to call the office with any  vaginal bleeding, fever, chills, difficulty voiding, or severe pain.          ______________________________ Malva Limes, M.D.     MA/MEDQ  D:  09/13/2011  T:  09/14/2011  Job:  [id number]

## 2011-10-11 ENCOUNTER — Other Ambulatory Visit: Payer: Self-pay | Admitting: Neurosurgery

## 2011-10-11 DIAGNOSIS — M542 Cervicalgia: Secondary | ICD-10-CM

## 2011-10-11 DIAGNOSIS — M47812 Spondylosis without myelopathy or radiculopathy, cervical region: Secondary | ICD-10-CM

## 2011-10-11 DIAGNOSIS — M5030 Other cervical disc degeneration, unspecified cervical region: Secondary | ICD-10-CM

## 2011-10-11 DIAGNOSIS — M5412 Radiculopathy, cervical region: Secondary | ICD-10-CM

## 2011-10-12 ENCOUNTER — Ambulatory Visit
Admission: RE | Admit: 2011-10-12 | Discharge: 2011-10-12 | Disposition: A | Discharge status: Home / Self Care | Payer: Medicare Other | Source: Ambulatory Visit | Attending: Neurosurgery | Admitting: Neurosurgery

## 2011-10-12 DIAGNOSIS — M5412 Radiculopathy, cervical region: Secondary | ICD-10-CM

## 2011-10-12 DIAGNOSIS — M542 Cervicalgia: Secondary | ICD-10-CM

## 2011-10-12 DIAGNOSIS — M47812 Spondylosis without myelopathy or radiculopathy, cervical region: Secondary | ICD-10-CM

## 2011-10-12 DIAGNOSIS — M5030 Other cervical disc degeneration, unspecified cervical region: Secondary | ICD-10-CM

## 2012-01-02 ENCOUNTER — Other Ambulatory Visit: Payer: Self-pay | Admitting: Orthopedic Surgery

## 2012-01-02 DIAGNOSIS — M25561 Pain in right knee: Secondary | ICD-10-CM

## 2012-01-03 ENCOUNTER — Ambulatory Visit
Admission: RE | Admit: 2012-01-03 | Discharge: 2012-01-03 | Disposition: A | Discharge status: Home / Self Care | Payer: Medicare Other | Source: Ambulatory Visit | Attending: Orthopedic Surgery | Admitting: Orthopedic Surgery

## 2012-01-03 DIAGNOSIS — M25561 Pain in right knee: Secondary | ICD-10-CM

## 2016-05-23 ENCOUNTER — Other Ambulatory Visit: Payer: Self-pay | Admitting: Neurosurgery

## 2016-05-23 DIAGNOSIS — G44319 Acute post-traumatic headache, not intractable: Secondary | ICD-10-CM

## 2016-05-25 ENCOUNTER — Ambulatory Visit
Admission: RE | Admit: 2016-05-25 | Discharge: 2016-05-25 | Disposition: A | Discharge status: Home / Self Care | Payer: Medicare Other | Source: Ambulatory Visit | Attending: Neurosurgery | Admitting: Neurosurgery

## 2016-05-25 DIAGNOSIS — G44319 Acute post-traumatic headache, not intractable: Secondary | ICD-10-CM

## 2016-08-07 HISTORY — PX: OTHER SURGICAL HISTORY: SHX169

## 2017-08-08 DIAGNOSIS — R7303 Prediabetes: Secondary | ICD-10-CM | POA: Diagnosis not present

## 2017-08-08 DIAGNOSIS — F419 Anxiety disorder, unspecified: Secondary | ICD-10-CM | POA: Diagnosis not present

## 2017-08-08 DIAGNOSIS — E785 Hyperlipidemia, unspecified: Secondary | ICD-10-CM | POA: Diagnosis not present

## 2017-08-08 DIAGNOSIS — Z1211 Encounter for screening for malignant neoplasm of colon: Secondary | ICD-10-CM | POA: Diagnosis not present

## 2017-08-16 DIAGNOSIS — M7541 Impingement syndrome of right shoulder: Secondary | ICD-10-CM | POA: Diagnosis not present

## 2017-08-16 DIAGNOSIS — S46011A Strain of muscle(s) and tendon(s) of the rotator cuff of right shoulder, initial encounter: Secondary | ICD-10-CM | POA: Diagnosis not present

## 2017-08-16 DIAGNOSIS — S43431A Superior glenoid labrum lesion of right shoulder, initial encounter: Secondary | ICD-10-CM | POA: Diagnosis not present

## 2017-08-16 DIAGNOSIS — M75121 Complete rotator cuff tear or rupture of right shoulder, not specified as traumatic: Secondary | ICD-10-CM | POA: Diagnosis not present

## 2017-08-16 DIAGNOSIS — M66321 Spontaneous rupture of flexor tendons, right upper arm: Secondary | ICD-10-CM | POA: Diagnosis not present

## 2017-08-16 DIAGNOSIS — M24111 Other articular cartilage disorders, right shoulder: Secondary | ICD-10-CM | POA: Diagnosis not present

## 2017-08-16 DIAGNOSIS — G8918 Other acute postprocedural pain: Secondary | ICD-10-CM | POA: Diagnosis not present

## 2017-08-23 DIAGNOSIS — J301 Allergic rhinitis due to pollen: Secondary | ICD-10-CM | POA: Diagnosis not present

## 2017-08-23 DIAGNOSIS — J3081 Allergic rhinitis due to animal (cat) (dog) hair and dander: Secondary | ICD-10-CM | POA: Diagnosis not present

## 2017-08-23 DIAGNOSIS — J3089 Other allergic rhinitis: Secondary | ICD-10-CM | POA: Diagnosis not present

## 2017-08-27 DIAGNOSIS — J3089 Other allergic rhinitis: Secondary | ICD-10-CM | POA: Diagnosis not present

## 2017-08-27 DIAGNOSIS — J301 Allergic rhinitis due to pollen: Secondary | ICD-10-CM | POA: Diagnosis not present

## 2017-08-27 DIAGNOSIS — J3081 Allergic rhinitis due to animal (cat) (dog) hair and dander: Secondary | ICD-10-CM | POA: Diagnosis not present

## 2017-08-29 DIAGNOSIS — J3081 Allergic rhinitis due to animal (cat) (dog) hair and dander: Secondary | ICD-10-CM | POA: Diagnosis not present

## 2017-08-29 DIAGNOSIS — J301 Allergic rhinitis due to pollen: Secondary | ICD-10-CM | POA: Diagnosis not present

## 2017-08-29 DIAGNOSIS — M7541 Impingement syndrome of right shoulder: Secondary | ICD-10-CM | POA: Diagnosis not present

## 2017-08-29 DIAGNOSIS — J3089 Other allergic rhinitis: Secondary | ICD-10-CM | POA: Diagnosis not present

## 2017-08-29 DIAGNOSIS — S46011D Strain of muscle(s) and tendon(s) of the rotator cuff of right shoulder, subsequent encounter: Secondary | ICD-10-CM | POA: Diagnosis not present

## 2017-08-30 DIAGNOSIS — M5415 Radiculopathy, thoracolumbar region: Secondary | ICD-10-CM | POA: Diagnosis not present

## 2017-08-30 DIAGNOSIS — M9902 Segmental and somatic dysfunction of thoracic region: Secondary | ICD-10-CM | POA: Diagnosis not present

## 2017-08-30 DIAGNOSIS — M5413 Radiculopathy, cervicothoracic region: Secondary | ICD-10-CM | POA: Diagnosis not present

## 2017-08-30 DIAGNOSIS — M9901 Segmental and somatic dysfunction of cervical region: Secondary | ICD-10-CM | POA: Diagnosis not present

## 2017-09-03 DIAGNOSIS — J3081 Allergic rhinitis due to animal (cat) (dog) hair and dander: Secondary | ICD-10-CM | POA: Diagnosis not present

## 2017-09-03 DIAGNOSIS — J301 Allergic rhinitis due to pollen: Secondary | ICD-10-CM | POA: Diagnosis not present

## 2017-09-03 DIAGNOSIS — J3089 Other allergic rhinitis: Secondary | ICD-10-CM | POA: Diagnosis not present

## 2017-09-05 DIAGNOSIS — J301 Allergic rhinitis due to pollen: Secondary | ICD-10-CM | POA: Diagnosis not present

## 2017-09-05 DIAGNOSIS — J3089 Other allergic rhinitis: Secondary | ICD-10-CM | POA: Diagnosis not present

## 2017-09-05 DIAGNOSIS — J3081 Allergic rhinitis due to animal (cat) (dog) hair and dander: Secondary | ICD-10-CM | POA: Diagnosis not present

## 2017-09-12 DIAGNOSIS — J3089 Other allergic rhinitis: Secondary | ICD-10-CM | POA: Diagnosis not present

## 2017-09-12 DIAGNOSIS — J301 Allergic rhinitis due to pollen: Secondary | ICD-10-CM | POA: Diagnosis not present

## 2017-09-12 DIAGNOSIS — J3081 Allergic rhinitis due to animal (cat) (dog) hair and dander: Secondary | ICD-10-CM | POA: Diagnosis not present

## 2017-09-13 DIAGNOSIS — M5413 Radiculopathy, cervicothoracic region: Secondary | ICD-10-CM | POA: Diagnosis not present

## 2017-09-13 DIAGNOSIS — M5415 Radiculopathy, thoracolumbar region: Secondary | ICD-10-CM | POA: Diagnosis not present

## 2017-09-13 DIAGNOSIS — M9901 Segmental and somatic dysfunction of cervical region: Secondary | ICD-10-CM | POA: Diagnosis not present

## 2017-09-13 DIAGNOSIS — M9902 Segmental and somatic dysfunction of thoracic region: Secondary | ICD-10-CM | POA: Diagnosis not present

## 2017-09-14 DIAGNOSIS — J301 Allergic rhinitis due to pollen: Secondary | ICD-10-CM | POA: Diagnosis not present

## 2017-09-18 DIAGNOSIS — J301 Allergic rhinitis due to pollen: Secondary | ICD-10-CM | POA: Diagnosis not present

## 2017-09-18 DIAGNOSIS — J3081 Allergic rhinitis due to animal (cat) (dog) hair and dander: Secondary | ICD-10-CM | POA: Diagnosis not present

## 2017-09-18 DIAGNOSIS — J3089 Other allergic rhinitis: Secondary | ICD-10-CM | POA: Diagnosis not present

## 2017-09-20 DIAGNOSIS — J301 Allergic rhinitis due to pollen: Secondary | ICD-10-CM | POA: Diagnosis not present

## 2017-09-20 DIAGNOSIS — J3089 Other allergic rhinitis: Secondary | ICD-10-CM | POA: Diagnosis not present

## 2017-09-20 DIAGNOSIS — J3081 Allergic rhinitis due to animal (cat) (dog) hair and dander: Secondary | ICD-10-CM | POA: Diagnosis not present

## 2017-09-25 DIAGNOSIS — J3089 Other allergic rhinitis: Secondary | ICD-10-CM | POA: Diagnosis not present

## 2017-09-25 DIAGNOSIS — J3081 Allergic rhinitis due to animal (cat) (dog) hair and dander: Secondary | ICD-10-CM | POA: Diagnosis not present

## 2017-09-25 DIAGNOSIS — J301 Allergic rhinitis due to pollen: Secondary | ICD-10-CM | POA: Diagnosis not present

## 2017-10-02 DIAGNOSIS — M25611 Stiffness of right shoulder, not elsewhere classified: Secondary | ICD-10-CM | POA: Diagnosis not present

## 2017-10-02 DIAGNOSIS — M6281 Muscle weakness (generalized): Secondary | ICD-10-CM | POA: Diagnosis not present

## 2017-10-02 DIAGNOSIS — M25511 Pain in right shoulder: Secondary | ICD-10-CM | POA: Diagnosis not present

## 2017-10-08 DIAGNOSIS — M5415 Radiculopathy, thoracolumbar region: Secondary | ICD-10-CM | POA: Diagnosis not present

## 2017-10-08 DIAGNOSIS — M9901 Segmental and somatic dysfunction of cervical region: Secondary | ICD-10-CM | POA: Diagnosis not present

## 2017-10-08 DIAGNOSIS — M25611 Stiffness of right shoulder, not elsewhere classified: Secondary | ICD-10-CM | POA: Diagnosis not present

## 2017-10-08 DIAGNOSIS — M6281 Muscle weakness (generalized): Secondary | ICD-10-CM | POA: Diagnosis not present

## 2017-10-08 DIAGNOSIS — M25511 Pain in right shoulder: Secondary | ICD-10-CM | POA: Diagnosis not present

## 2017-10-08 DIAGNOSIS — M5413 Radiculopathy, cervicothoracic region: Secondary | ICD-10-CM | POA: Diagnosis not present

## 2017-10-08 DIAGNOSIS — M9902 Segmental and somatic dysfunction of thoracic region: Secondary | ICD-10-CM | POA: Diagnosis not present

## 2017-10-10 DIAGNOSIS — J3089 Other allergic rhinitis: Secondary | ICD-10-CM | POA: Diagnosis not present

## 2017-10-10 DIAGNOSIS — J3081 Allergic rhinitis due to animal (cat) (dog) hair and dander: Secondary | ICD-10-CM | POA: Diagnosis not present

## 2017-10-10 DIAGNOSIS — J301 Allergic rhinitis due to pollen: Secondary | ICD-10-CM | POA: Diagnosis not present

## 2017-10-11 DIAGNOSIS — M25611 Stiffness of right shoulder, not elsewhere classified: Secondary | ICD-10-CM | POA: Diagnosis not present

## 2017-10-11 DIAGNOSIS — M6281 Muscle weakness (generalized): Secondary | ICD-10-CM | POA: Diagnosis not present

## 2017-10-11 DIAGNOSIS — M25511 Pain in right shoulder: Secondary | ICD-10-CM | POA: Diagnosis not present

## 2017-10-16 DIAGNOSIS — M6281 Muscle weakness (generalized): Secondary | ICD-10-CM | POA: Diagnosis not present

## 2017-10-16 DIAGNOSIS — M25611 Stiffness of right shoulder, not elsewhere classified: Secondary | ICD-10-CM | POA: Diagnosis not present

## 2017-10-16 DIAGNOSIS — M25511 Pain in right shoulder: Secondary | ICD-10-CM | POA: Diagnosis not present

## 2017-10-18 DIAGNOSIS — M6281 Muscle weakness (generalized): Secondary | ICD-10-CM | POA: Diagnosis not present

## 2017-10-18 DIAGNOSIS — M25511 Pain in right shoulder: Secondary | ICD-10-CM | POA: Diagnosis not present

## 2017-10-18 DIAGNOSIS — M25611 Stiffness of right shoulder, not elsewhere classified: Secondary | ICD-10-CM | POA: Diagnosis not present

## 2017-10-23 DIAGNOSIS — M25511 Pain in right shoulder: Secondary | ICD-10-CM | POA: Diagnosis not present

## 2017-10-23 DIAGNOSIS — M25611 Stiffness of right shoulder, not elsewhere classified: Secondary | ICD-10-CM | POA: Diagnosis not present

## 2017-10-23 DIAGNOSIS — M6281 Muscle weakness (generalized): Secondary | ICD-10-CM | POA: Diagnosis not present

## 2017-10-24 DIAGNOSIS — J3081 Allergic rhinitis due to animal (cat) (dog) hair and dander: Secondary | ICD-10-CM | POA: Diagnosis not present

## 2017-10-24 DIAGNOSIS — J301 Allergic rhinitis due to pollen: Secondary | ICD-10-CM | POA: Diagnosis not present

## 2017-10-24 DIAGNOSIS — J3089 Other allergic rhinitis: Secondary | ICD-10-CM | POA: Diagnosis not present

## 2017-10-25 DIAGNOSIS — M25511 Pain in right shoulder: Secondary | ICD-10-CM | POA: Diagnosis not present

## 2017-10-25 DIAGNOSIS — M25611 Stiffness of right shoulder, not elsewhere classified: Secondary | ICD-10-CM | POA: Diagnosis not present

## 2017-10-25 DIAGNOSIS — M6281 Muscle weakness (generalized): Secondary | ICD-10-CM | POA: Diagnosis not present

## 2017-10-29 DIAGNOSIS — M25511 Pain in right shoulder: Secondary | ICD-10-CM | POA: Diagnosis not present

## 2017-10-29 DIAGNOSIS — M25611 Stiffness of right shoulder, not elsewhere classified: Secondary | ICD-10-CM | POA: Diagnosis not present

## 2017-10-29 DIAGNOSIS — M6281 Muscle weakness (generalized): Secondary | ICD-10-CM | POA: Diagnosis not present

## 2017-11-01 DIAGNOSIS — M6281 Muscle weakness (generalized): Secondary | ICD-10-CM | POA: Diagnosis not present

## 2017-11-01 DIAGNOSIS — M25611 Stiffness of right shoulder, not elsewhere classified: Secondary | ICD-10-CM | POA: Diagnosis not present

## 2017-11-01 DIAGNOSIS — M25511 Pain in right shoulder: Secondary | ICD-10-CM | POA: Diagnosis not present

## 2017-11-06 DIAGNOSIS — M6281 Muscle weakness (generalized): Secondary | ICD-10-CM | POA: Diagnosis not present

## 2017-11-06 DIAGNOSIS — M25511 Pain in right shoulder: Secondary | ICD-10-CM | POA: Diagnosis not present

## 2017-11-06 DIAGNOSIS — M25611 Stiffness of right shoulder, not elsewhere classified: Secondary | ICD-10-CM | POA: Diagnosis not present

## 2017-11-08 DIAGNOSIS — M6281 Muscle weakness (generalized): Secondary | ICD-10-CM | POA: Diagnosis not present

## 2017-11-08 DIAGNOSIS — J3089 Other allergic rhinitis: Secondary | ICD-10-CM | POA: Diagnosis not present

## 2017-11-08 DIAGNOSIS — M25611 Stiffness of right shoulder, not elsewhere classified: Secondary | ICD-10-CM | POA: Diagnosis not present

## 2017-11-08 DIAGNOSIS — J301 Allergic rhinitis due to pollen: Secondary | ICD-10-CM | POA: Diagnosis not present

## 2017-11-08 DIAGNOSIS — J3081 Allergic rhinitis due to animal (cat) (dog) hair and dander: Secondary | ICD-10-CM | POA: Diagnosis not present

## 2017-11-08 DIAGNOSIS — M25511 Pain in right shoulder: Secondary | ICD-10-CM | POA: Diagnosis not present

## 2017-11-12 DIAGNOSIS — M25511 Pain in right shoulder: Secondary | ICD-10-CM | POA: Diagnosis not present

## 2017-11-12 DIAGNOSIS — M25611 Stiffness of right shoulder, not elsewhere classified: Secondary | ICD-10-CM | POA: Diagnosis not present

## 2017-11-12 DIAGNOSIS — M6281 Muscle weakness (generalized): Secondary | ICD-10-CM | POA: Diagnosis not present

## 2017-11-15 DIAGNOSIS — M25611 Stiffness of right shoulder, not elsewhere classified: Secondary | ICD-10-CM | POA: Diagnosis not present

## 2017-11-15 DIAGNOSIS — M25511 Pain in right shoulder: Secondary | ICD-10-CM | POA: Diagnosis not present

## 2017-11-15 DIAGNOSIS — M6281 Muscle weakness (generalized): Secondary | ICD-10-CM | POA: Diagnosis not present

## 2017-11-19 DIAGNOSIS — M25511 Pain in right shoulder: Secondary | ICD-10-CM | POA: Diagnosis not present

## 2017-11-19 DIAGNOSIS — M6281 Muscle weakness (generalized): Secondary | ICD-10-CM | POA: Diagnosis not present

## 2017-11-19 DIAGNOSIS — M25611 Stiffness of right shoulder, not elsewhere classified: Secondary | ICD-10-CM | POA: Diagnosis not present

## 2017-11-22 DIAGNOSIS — M25511 Pain in right shoulder: Secondary | ICD-10-CM | POA: Diagnosis not present

## 2017-11-22 DIAGNOSIS — M6281 Muscle weakness (generalized): Secondary | ICD-10-CM | POA: Diagnosis not present

## 2017-11-22 DIAGNOSIS — J3089 Other allergic rhinitis: Secondary | ICD-10-CM | POA: Diagnosis not present

## 2017-11-22 DIAGNOSIS — J301 Allergic rhinitis due to pollen: Secondary | ICD-10-CM | POA: Diagnosis not present

## 2017-11-22 DIAGNOSIS — J3081 Allergic rhinitis due to animal (cat) (dog) hair and dander: Secondary | ICD-10-CM | POA: Diagnosis not present

## 2017-11-22 DIAGNOSIS — M25611 Stiffness of right shoulder, not elsewhere classified: Secondary | ICD-10-CM | POA: Diagnosis not present

## 2017-11-26 DIAGNOSIS — M25611 Stiffness of right shoulder, not elsewhere classified: Secondary | ICD-10-CM | POA: Diagnosis not present

## 2017-11-26 DIAGNOSIS — M25511 Pain in right shoulder: Secondary | ICD-10-CM | POA: Diagnosis not present

## 2017-11-26 DIAGNOSIS — M6281 Muscle weakness (generalized): Secondary | ICD-10-CM | POA: Diagnosis not present

## 2017-11-29 DIAGNOSIS — M6281 Muscle weakness (generalized): Secondary | ICD-10-CM | POA: Diagnosis not present

## 2017-11-29 DIAGNOSIS — M25511 Pain in right shoulder: Secondary | ICD-10-CM | POA: Diagnosis not present

## 2017-12-04 DIAGNOSIS — J3089 Other allergic rhinitis: Secondary | ICD-10-CM | POA: Diagnosis not present

## 2017-12-04 DIAGNOSIS — J3081 Allergic rhinitis due to animal (cat) (dog) hair and dander: Secondary | ICD-10-CM | POA: Diagnosis not present

## 2017-12-04 DIAGNOSIS — M1711 Unilateral primary osteoarthritis, right knee: Secondary | ICD-10-CM | POA: Diagnosis not present

## 2017-12-04 DIAGNOSIS — J301 Allergic rhinitis due to pollen: Secondary | ICD-10-CM | POA: Diagnosis not present

## 2017-12-06 DIAGNOSIS — M25511 Pain in right shoulder: Secondary | ICD-10-CM | POA: Diagnosis not present

## 2017-12-06 DIAGNOSIS — M6281 Muscle weakness (generalized): Secondary | ICD-10-CM | POA: Diagnosis not present

## 2017-12-11 DIAGNOSIS — M5413 Radiculopathy, cervicothoracic region: Secondary | ICD-10-CM | POA: Diagnosis not present

## 2017-12-11 DIAGNOSIS — M1711 Unilateral primary osteoarthritis, right knee: Secondary | ICD-10-CM | POA: Diagnosis not present

## 2017-12-11 DIAGNOSIS — M9902 Segmental and somatic dysfunction of thoracic region: Secondary | ICD-10-CM | POA: Diagnosis not present

## 2017-12-11 DIAGNOSIS — M9901 Segmental and somatic dysfunction of cervical region: Secondary | ICD-10-CM | POA: Diagnosis not present

## 2017-12-11 DIAGNOSIS — M5415 Radiculopathy, thoracolumbar region: Secondary | ICD-10-CM | POA: Diagnosis not present

## 2017-12-13 DIAGNOSIS — M6281 Muscle weakness (generalized): Secondary | ICD-10-CM | POA: Diagnosis not present

## 2017-12-13 DIAGNOSIS — M25511 Pain in right shoulder: Secondary | ICD-10-CM | POA: Diagnosis not present

## 2017-12-18 DIAGNOSIS — J3081 Allergic rhinitis due to animal (cat) (dog) hair and dander: Secondary | ICD-10-CM | POA: Diagnosis not present

## 2017-12-18 DIAGNOSIS — J3089 Other allergic rhinitis: Secondary | ICD-10-CM | POA: Diagnosis not present

## 2017-12-18 DIAGNOSIS — J301 Allergic rhinitis due to pollen: Secondary | ICD-10-CM | POA: Diagnosis not present

## 2017-12-18 DIAGNOSIS — M1711 Unilateral primary osteoarthritis, right knee: Secondary | ICD-10-CM | POA: Diagnosis not present

## 2017-12-20 DIAGNOSIS — M6281 Muscle weakness (generalized): Secondary | ICD-10-CM | POA: Diagnosis not present

## 2017-12-20 DIAGNOSIS — M25511 Pain in right shoulder: Secondary | ICD-10-CM | POA: Diagnosis not present

## 2017-12-27 DIAGNOSIS — M6281 Muscle weakness (generalized): Secondary | ICD-10-CM | POA: Diagnosis not present

## 2018-01-01 DIAGNOSIS — J3089 Other allergic rhinitis: Secondary | ICD-10-CM | POA: Diagnosis not present

## 2018-01-01 DIAGNOSIS — J3081 Allergic rhinitis due to animal (cat) (dog) hair and dander: Secondary | ICD-10-CM | POA: Diagnosis not present

## 2018-01-01 DIAGNOSIS — J301 Allergic rhinitis due to pollen: Secondary | ICD-10-CM | POA: Diagnosis not present

## 2018-01-09 DIAGNOSIS — M9901 Segmental and somatic dysfunction of cervical region: Secondary | ICD-10-CM | POA: Diagnosis not present

## 2018-01-09 DIAGNOSIS — M9902 Segmental and somatic dysfunction of thoracic region: Secondary | ICD-10-CM | POA: Diagnosis not present

## 2018-01-09 DIAGNOSIS — M5413 Radiculopathy, cervicothoracic region: Secondary | ICD-10-CM | POA: Diagnosis not present

## 2018-01-09 DIAGNOSIS — M5415 Radiculopathy, thoracolumbar region: Secondary | ICD-10-CM | POA: Diagnosis not present

## 2018-01-14 DIAGNOSIS — J3089 Other allergic rhinitis: Secondary | ICD-10-CM | POA: Diagnosis not present

## 2018-01-14 DIAGNOSIS — J301 Allergic rhinitis due to pollen: Secondary | ICD-10-CM | POA: Diagnosis not present

## 2018-01-14 DIAGNOSIS — J3081 Allergic rhinitis due to animal (cat) (dog) hair and dander: Secondary | ICD-10-CM | POA: Diagnosis not present

## 2018-01-23 DIAGNOSIS — M5413 Radiculopathy, cervicothoracic region: Secondary | ICD-10-CM | POA: Diagnosis not present

## 2018-01-23 DIAGNOSIS — M5415 Radiculopathy, thoracolumbar region: Secondary | ICD-10-CM | POA: Diagnosis not present

## 2018-01-23 DIAGNOSIS — M9901 Segmental and somatic dysfunction of cervical region: Secondary | ICD-10-CM | POA: Diagnosis not present

## 2018-01-23 DIAGNOSIS — M9902 Segmental and somatic dysfunction of thoracic region: Secondary | ICD-10-CM | POA: Diagnosis not present

## 2018-02-05 DIAGNOSIS — J3081 Allergic rhinitis due to animal (cat) (dog) hair and dander: Secondary | ICD-10-CM | POA: Diagnosis not present

## 2018-02-05 DIAGNOSIS — J301 Allergic rhinitis due to pollen: Secondary | ICD-10-CM | POA: Diagnosis not present

## 2018-02-05 DIAGNOSIS — J3089 Other allergic rhinitis: Secondary | ICD-10-CM | POA: Diagnosis not present

## 2018-02-27 DIAGNOSIS — J301 Allergic rhinitis due to pollen: Secondary | ICD-10-CM | POA: Diagnosis not present

## 2018-02-27 DIAGNOSIS — J3081 Allergic rhinitis due to animal (cat) (dog) hair and dander: Secondary | ICD-10-CM | POA: Diagnosis not present

## 2018-02-27 DIAGNOSIS — J3089 Other allergic rhinitis: Secondary | ICD-10-CM | POA: Diagnosis not present

## 2018-03-04 DIAGNOSIS — M5415 Radiculopathy, thoracolumbar region: Secondary | ICD-10-CM | POA: Diagnosis not present

## 2018-03-04 DIAGNOSIS — M9902 Segmental and somatic dysfunction of thoracic region: Secondary | ICD-10-CM | POA: Diagnosis not present

## 2018-03-04 DIAGNOSIS — M9901 Segmental and somatic dysfunction of cervical region: Secondary | ICD-10-CM | POA: Diagnosis not present

## 2018-03-04 DIAGNOSIS — M5413 Radiculopathy, cervicothoracic region: Secondary | ICD-10-CM | POA: Diagnosis not present

## 2018-03-14 DIAGNOSIS — J3089 Other allergic rhinitis: Secondary | ICD-10-CM | POA: Diagnosis not present

## 2018-03-14 DIAGNOSIS — J3081 Allergic rhinitis due to animal (cat) (dog) hair and dander: Secondary | ICD-10-CM | POA: Diagnosis not present

## 2018-03-14 DIAGNOSIS — J301 Allergic rhinitis due to pollen: Secondary | ICD-10-CM | POA: Diagnosis not present

## 2018-03-25 DIAGNOSIS — J3081 Allergic rhinitis due to animal (cat) (dog) hair and dander: Secondary | ICD-10-CM | POA: Diagnosis not present

## 2018-03-25 DIAGNOSIS — J301 Allergic rhinitis due to pollen: Secondary | ICD-10-CM | POA: Diagnosis not present

## 2018-03-25 DIAGNOSIS — J3089 Other allergic rhinitis: Secondary | ICD-10-CM | POA: Diagnosis not present

## 2018-04-03 DIAGNOSIS — E785 Hyperlipidemia, unspecified: Secondary | ICD-10-CM | POA: Diagnosis not present

## 2018-04-03 DIAGNOSIS — F419 Anxiety disorder, unspecified: Secondary | ICD-10-CM | POA: Diagnosis not present

## 2018-04-03 DIAGNOSIS — R7303 Prediabetes: Secondary | ICD-10-CM | POA: Diagnosis not present

## 2018-04-03 DIAGNOSIS — F339 Major depressive disorder, recurrent, unspecified: Secondary | ICD-10-CM | POA: Diagnosis not present

## 2018-04-09 DIAGNOSIS — M9902 Segmental and somatic dysfunction of thoracic region: Secondary | ICD-10-CM | POA: Diagnosis not present

## 2018-04-09 DIAGNOSIS — M9901 Segmental and somatic dysfunction of cervical region: Secondary | ICD-10-CM | POA: Diagnosis not present

## 2018-04-09 DIAGNOSIS — M5415 Radiculopathy, thoracolumbar region: Secondary | ICD-10-CM | POA: Diagnosis not present

## 2018-04-09 DIAGNOSIS — M5413 Radiculopathy, cervicothoracic region: Secondary | ICD-10-CM | POA: Diagnosis not present

## 2018-04-11 DIAGNOSIS — J3081 Allergic rhinitis due to animal (cat) (dog) hair and dander: Secondary | ICD-10-CM | POA: Diagnosis not present

## 2018-04-11 DIAGNOSIS — J301 Allergic rhinitis due to pollen: Secondary | ICD-10-CM | POA: Diagnosis not present

## 2018-04-11 DIAGNOSIS — J3089 Other allergic rhinitis: Secondary | ICD-10-CM | POA: Diagnosis not present

## 2018-04-15 DIAGNOSIS — J3089 Other allergic rhinitis: Secondary | ICD-10-CM | POA: Diagnosis not present

## 2018-04-15 DIAGNOSIS — J301 Allergic rhinitis due to pollen: Secondary | ICD-10-CM | POA: Diagnosis not present

## 2018-04-15 DIAGNOSIS — J3081 Allergic rhinitis due to animal (cat) (dog) hair and dander: Secondary | ICD-10-CM | POA: Diagnosis not present

## 2018-04-25 DIAGNOSIS — J3089 Other allergic rhinitis: Secondary | ICD-10-CM | POA: Diagnosis not present

## 2018-04-25 DIAGNOSIS — J301 Allergic rhinitis due to pollen: Secondary | ICD-10-CM | POA: Diagnosis not present

## 2018-04-25 DIAGNOSIS — J3081 Allergic rhinitis due to animal (cat) (dog) hair and dander: Secondary | ICD-10-CM | POA: Diagnosis not present

## 2018-04-26 DIAGNOSIS — D72819 Decreased white blood cell count, unspecified: Secondary | ICD-10-CM | POA: Diagnosis not present

## 2018-05-01 DIAGNOSIS — J3081 Allergic rhinitis due to animal (cat) (dog) hair and dander: Secondary | ICD-10-CM | POA: Diagnosis not present

## 2018-05-01 DIAGNOSIS — H43812 Vitreous degeneration, left eye: Secondary | ICD-10-CM | POA: Diagnosis not present

## 2018-05-01 DIAGNOSIS — J3089 Other allergic rhinitis: Secondary | ICD-10-CM | POA: Diagnosis not present

## 2018-05-08 DIAGNOSIS — J301 Allergic rhinitis due to pollen: Secondary | ICD-10-CM | POA: Diagnosis not present

## 2018-05-08 DIAGNOSIS — J3081 Allergic rhinitis due to animal (cat) (dog) hair and dander: Secondary | ICD-10-CM | POA: Diagnosis not present

## 2018-05-08 DIAGNOSIS — J3089 Other allergic rhinitis: Secondary | ICD-10-CM | POA: Diagnosis not present

## 2018-05-09 DIAGNOSIS — H903 Sensorineural hearing loss, bilateral: Secondary | ICD-10-CM | POA: Diagnosis not present

## 2018-05-14 DIAGNOSIS — M5415 Radiculopathy, thoracolumbar region: Secondary | ICD-10-CM | POA: Diagnosis not present

## 2018-05-14 DIAGNOSIS — M9901 Segmental and somatic dysfunction of cervical region: Secondary | ICD-10-CM | POA: Diagnosis not present

## 2018-05-14 DIAGNOSIS — M5413 Radiculopathy, cervicothoracic region: Secondary | ICD-10-CM | POA: Diagnosis not present

## 2018-05-14 DIAGNOSIS — M9902 Segmental and somatic dysfunction of thoracic region: Secondary | ICD-10-CM | POA: Diagnosis not present

## 2018-05-15 DIAGNOSIS — J301 Allergic rhinitis due to pollen: Secondary | ICD-10-CM | POA: Diagnosis not present

## 2018-05-15 DIAGNOSIS — J3081 Allergic rhinitis due to animal (cat) (dog) hair and dander: Secondary | ICD-10-CM | POA: Diagnosis not present

## 2018-05-15 DIAGNOSIS — J3089 Other allergic rhinitis: Secondary | ICD-10-CM | POA: Diagnosis not present

## 2018-05-16 DIAGNOSIS — Z1231 Encounter for screening mammogram for malignant neoplasm of breast: Secondary | ICD-10-CM | POA: Diagnosis not present

## 2018-05-17 DIAGNOSIS — J3081 Allergic rhinitis due to animal (cat) (dog) hair and dander: Secondary | ICD-10-CM | POA: Diagnosis not present

## 2018-05-17 DIAGNOSIS — J3089 Other allergic rhinitis: Secondary | ICD-10-CM | POA: Diagnosis not present

## 2018-05-20 DIAGNOSIS — J301 Allergic rhinitis due to pollen: Secondary | ICD-10-CM | POA: Diagnosis not present

## 2018-05-20 DIAGNOSIS — J3089 Other allergic rhinitis: Secondary | ICD-10-CM | POA: Diagnosis not present

## 2018-05-20 DIAGNOSIS — J3081 Allergic rhinitis due to animal (cat) (dog) hair and dander: Secondary | ICD-10-CM | POA: Diagnosis not present

## 2018-05-31 DIAGNOSIS — M25511 Pain in right shoulder: Secondary | ICD-10-CM | POA: Diagnosis not present

## 2018-05-31 DIAGNOSIS — M542 Cervicalgia: Secondary | ICD-10-CM | POA: Diagnosis not present

## 2018-06-11 DIAGNOSIS — H903 Sensorineural hearing loss, bilateral: Secondary | ICD-10-CM | POA: Diagnosis not present

## 2018-06-11 DIAGNOSIS — J3089 Other allergic rhinitis: Secondary | ICD-10-CM | POA: Diagnosis not present

## 2018-06-11 DIAGNOSIS — J3081 Allergic rhinitis due to animal (cat) (dog) hair and dander: Secondary | ICD-10-CM | POA: Diagnosis not present

## 2018-06-11 DIAGNOSIS — J301 Allergic rhinitis due to pollen: Secondary | ICD-10-CM | POA: Diagnosis not present

## 2018-06-14 DIAGNOSIS — J301 Allergic rhinitis due to pollen: Secondary | ICD-10-CM | POA: Diagnosis not present

## 2018-06-19 DIAGNOSIS — J3081 Allergic rhinitis due to animal (cat) (dog) hair and dander: Secondary | ICD-10-CM | POA: Diagnosis not present

## 2018-06-19 DIAGNOSIS — J301 Allergic rhinitis due to pollen: Secondary | ICD-10-CM | POA: Diagnosis not present

## 2018-06-19 DIAGNOSIS — J3089 Other allergic rhinitis: Secondary | ICD-10-CM | POA: Diagnosis not present

## 2018-06-21 DIAGNOSIS — J301 Allergic rhinitis due to pollen: Secondary | ICD-10-CM | POA: Diagnosis not present

## 2018-06-21 DIAGNOSIS — J3089 Other allergic rhinitis: Secondary | ICD-10-CM | POA: Diagnosis not present

## 2018-06-21 DIAGNOSIS — J3081 Allergic rhinitis due to animal (cat) (dog) hair and dander: Secondary | ICD-10-CM | POA: Diagnosis not present

## 2018-06-28 DIAGNOSIS — J301 Allergic rhinitis due to pollen: Secondary | ICD-10-CM | POA: Diagnosis not present

## 2018-06-28 DIAGNOSIS — M25511 Pain in right shoulder: Secondary | ICD-10-CM | POA: Diagnosis not present

## 2018-06-28 DIAGNOSIS — J3081 Allergic rhinitis due to animal (cat) (dog) hair and dander: Secondary | ICD-10-CM | POA: Diagnosis not present

## 2018-06-28 DIAGNOSIS — M1711 Unilateral primary osteoarthritis, right knee: Secondary | ICD-10-CM | POA: Diagnosis not present

## 2018-06-28 DIAGNOSIS — J3089 Other allergic rhinitis: Secondary | ICD-10-CM | POA: Diagnosis not present

## 2018-07-08 DIAGNOSIS — M9901 Segmental and somatic dysfunction of cervical region: Secondary | ICD-10-CM | POA: Diagnosis not present

## 2018-07-08 DIAGNOSIS — M9902 Segmental and somatic dysfunction of thoracic region: Secondary | ICD-10-CM | POA: Diagnosis not present

## 2018-07-08 DIAGNOSIS — J301 Allergic rhinitis due to pollen: Secondary | ICD-10-CM | POA: Diagnosis not present

## 2018-07-08 DIAGNOSIS — J3089 Other allergic rhinitis: Secondary | ICD-10-CM | POA: Diagnosis not present

## 2018-07-08 DIAGNOSIS — J3081 Allergic rhinitis due to animal (cat) (dog) hair and dander: Secondary | ICD-10-CM | POA: Diagnosis not present

## 2018-07-08 DIAGNOSIS — M5413 Radiculopathy, cervicothoracic region: Secondary | ICD-10-CM | POA: Diagnosis not present

## 2018-07-08 DIAGNOSIS — M5415 Radiculopathy, thoracolumbar region: Secondary | ICD-10-CM | POA: Diagnosis not present

## 2018-07-10 IMAGING — CT CT HEAD W/O CM
4 series · 17 of 47 positions shown, 19 images · non-contrast
Comparison: None.

CLINICAL DATA: Headache and blurry vision, status post fall March 2016. Evaluate posttraumatic headache.

EXAM:
CT HEAD WITHOUT CONTRAST
TECHNIQUE: Contiguous axial images were obtained from the base of the skull
through the vertex without intravenous contrast.

[Series 3: head bone · axial · 0.49mm/px · z∈[-14,+18]mm · 3 of 63 slices shown]
[im 7/63  bone]
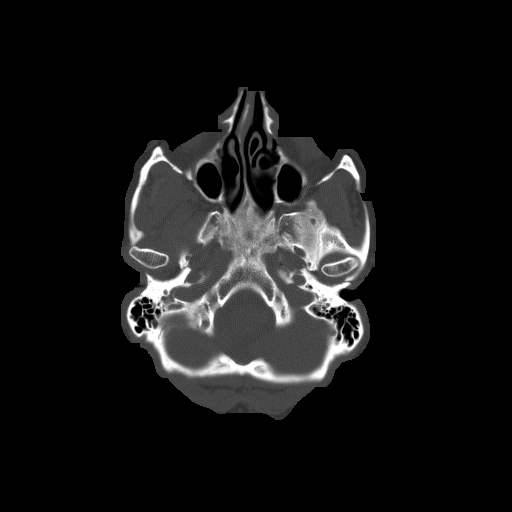
[im 14/63  bone]
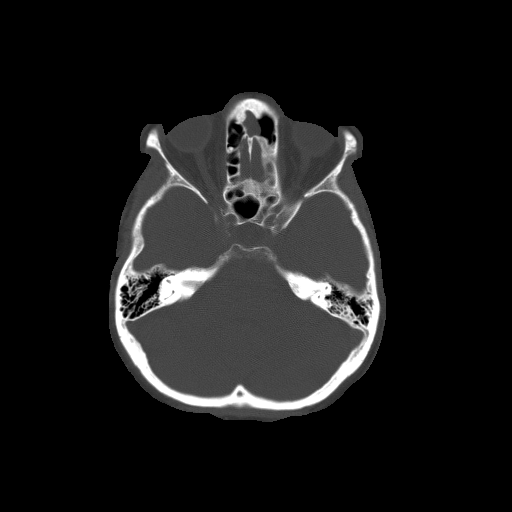
[im 20/63  bone]
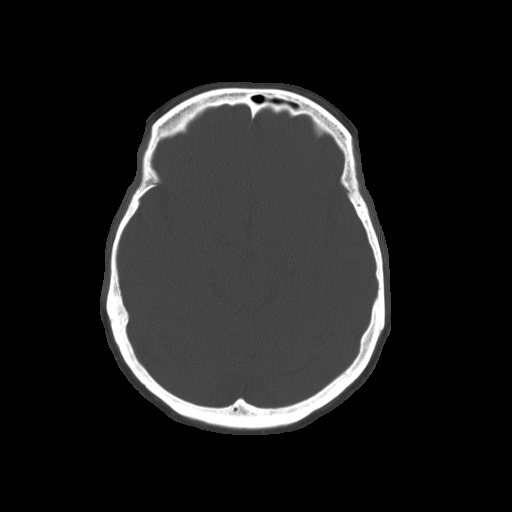

[Series 32: 3d filtered head w/o · axial · non-contrast · 0.49mm/px · z∈[-14,+106]mm · 8 of 32 slices shown, 10 images]
[im 4/32  brain]
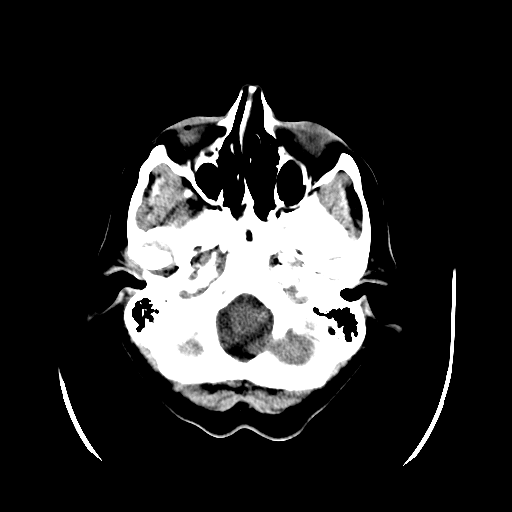
[im 4/32  bone]
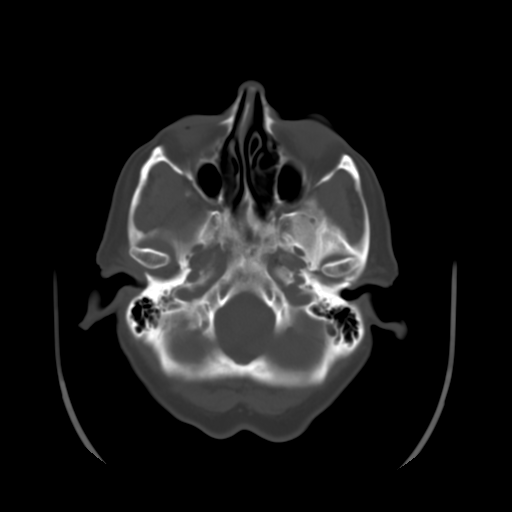
[im 7/32  brain]
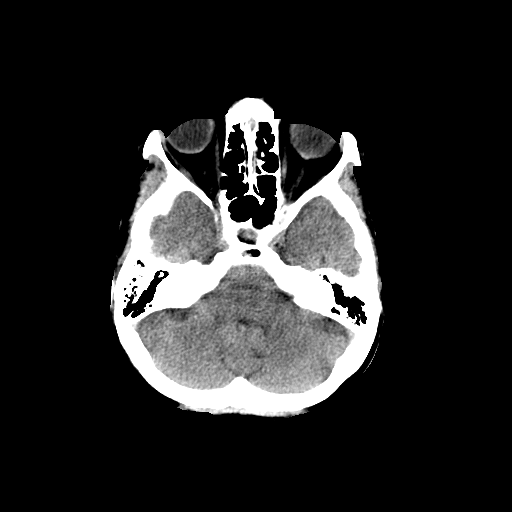
[im 11/32  brain]
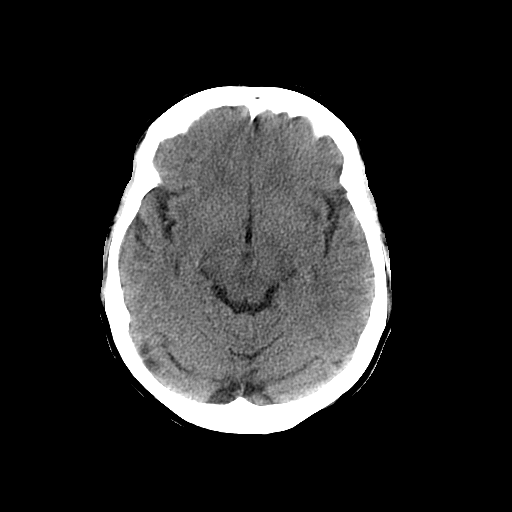
[im 14/32  brain]
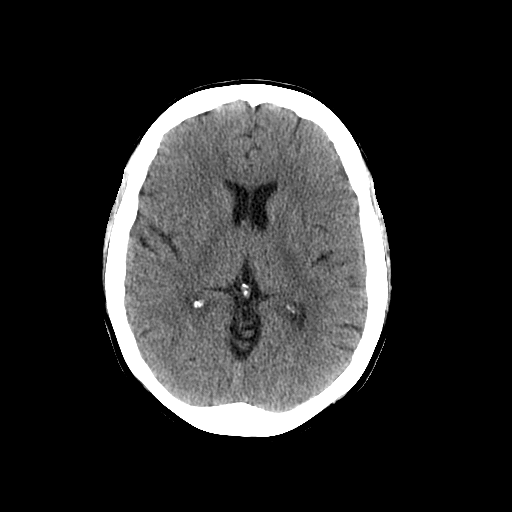
[im 18/32  brain]
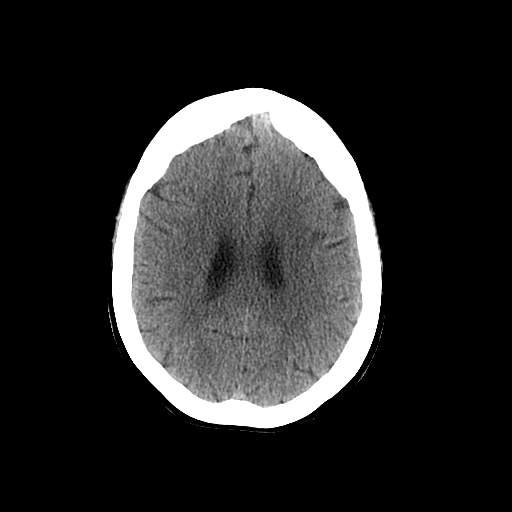
[im 18/32  bone]
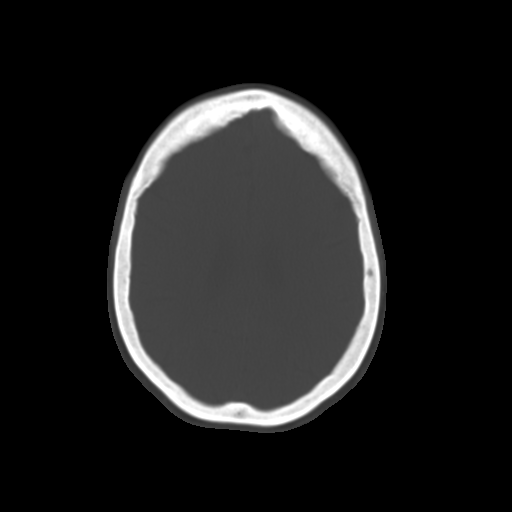
[im 21/32  brain]
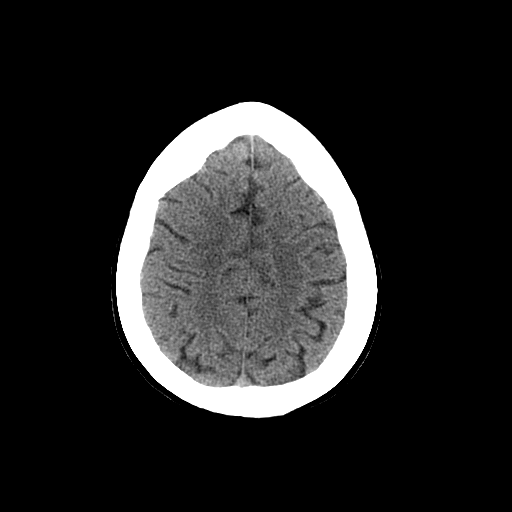
[im 25/32  brain]
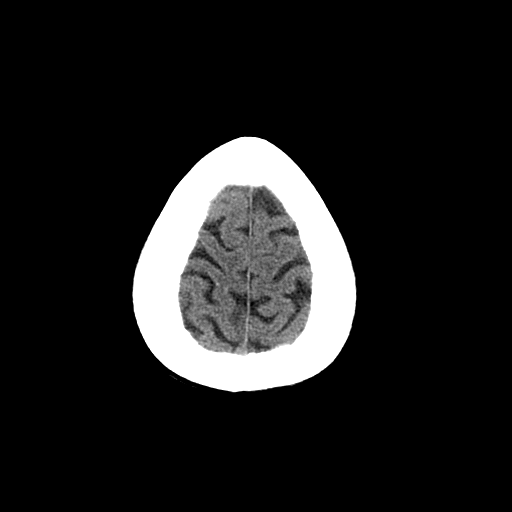
[im 28/32  brain]
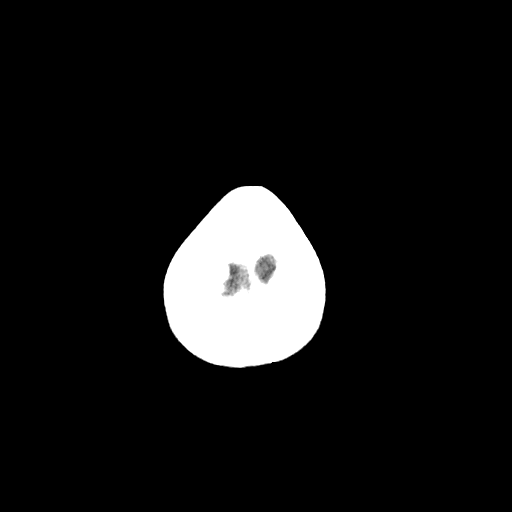

[Series 601: coronal brain · coronal · 0.49mm/px · 3 of 64 slices shown]
[im 22/64  brain]
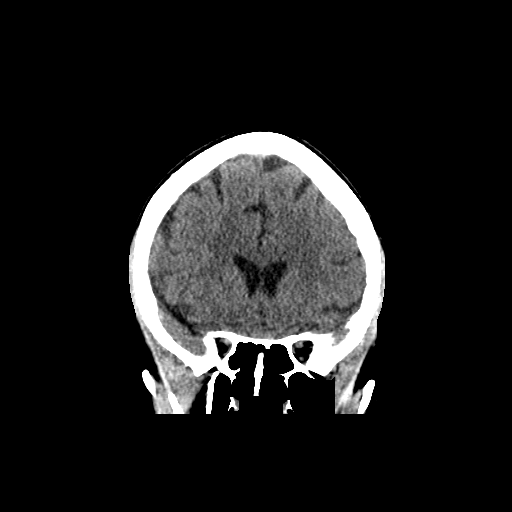
[im 29/64  brain]
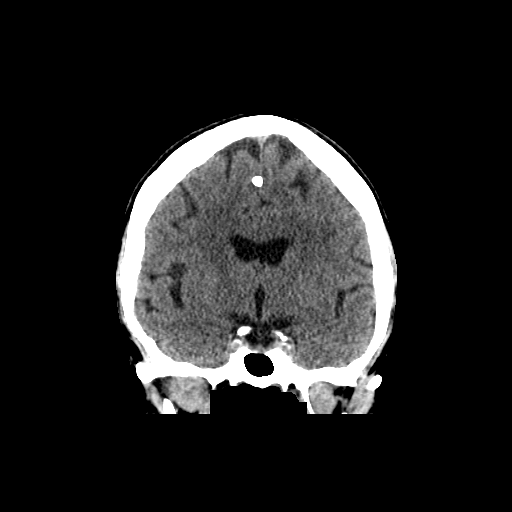
[im 36/64  brain]
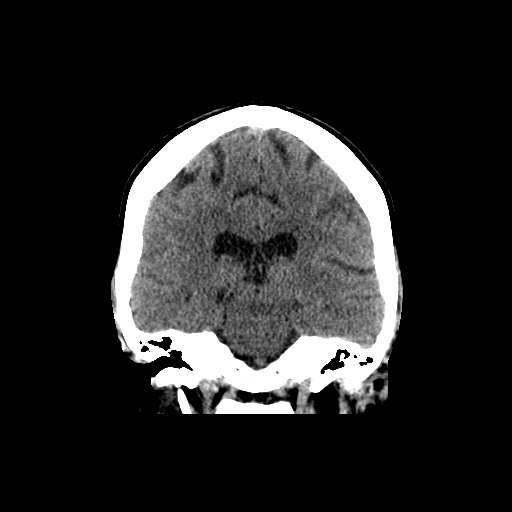

[Series 602: sagittal brain · sagittal · 0.49mm/px · 3 of 56 slices shown]
[im 19/56  brain]
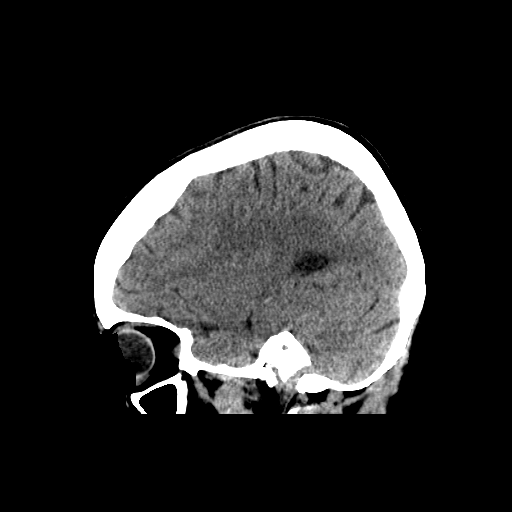
[im 28/56  brain]
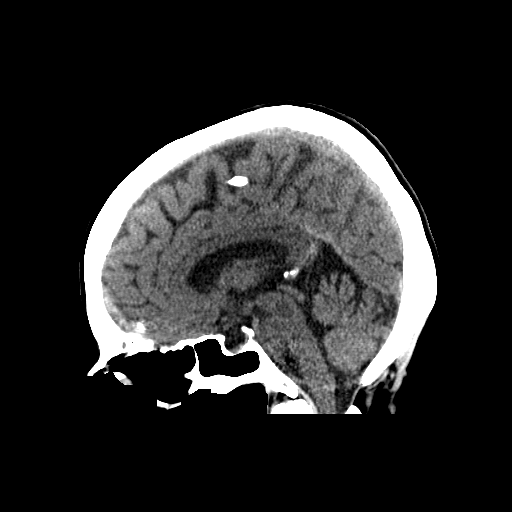
[im 37/56  brain]
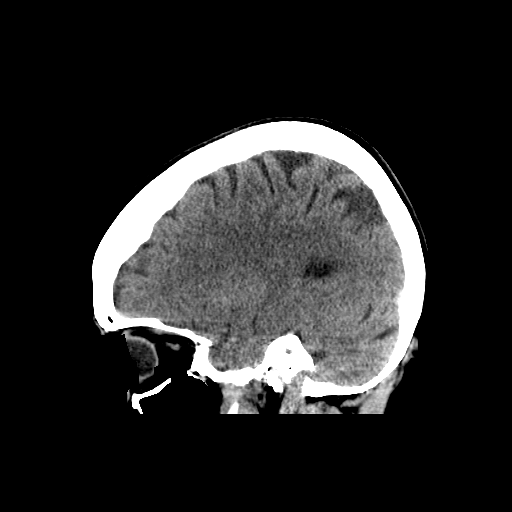

[17 of 47 positions shown; findings below may reference images not displayed]

FINDINGS: BRAIN: The ventricles and sulci are normal for age. No
intraparenchymal hemorrhage, mass effect nor midline shift. Patchy
supratentorial white matter hypodensities less than expected for
patient's age, though non-specific are most compatible with chronic
small vessel ischemic disease. No acute large vascular territory
infarcts. No abnormal extra-axial fluid collections. Basal cisterns
are patent.

VASCULAR: Minimal calcific atherosclerosis of the carotid siphons.

SKULL: No skull fracture. No significant scalp soft tissue swelling.

SINUSES/ORBITS: Mild paranasal sinus mucosal thickening with small
sphenoid sinus air-fluid levels. Small bifrontal mucosal retention
cyst. Status post bilateral ocular lens implants. The included
ocular globes and orbital contents are non-suspicious.

OTHER: None.
IMPRESSION: Mild acute paranasal sinusitis.

Otherwise negative CT HEAD for age.

## 2018-07-15 DIAGNOSIS — J3081 Allergic rhinitis due to animal (cat) (dog) hair and dander: Secondary | ICD-10-CM | POA: Diagnosis not present

## 2018-07-15 DIAGNOSIS — J3089 Other allergic rhinitis: Secondary | ICD-10-CM | POA: Diagnosis not present

## 2018-07-15 DIAGNOSIS — J4520 Mild intermittent asthma, uncomplicated: Secondary | ICD-10-CM | POA: Diagnosis not present

## 2018-07-15 DIAGNOSIS — J301 Allergic rhinitis due to pollen: Secondary | ICD-10-CM | POA: Diagnosis not present

## 2018-07-29 DIAGNOSIS — J3089 Other allergic rhinitis: Secondary | ICD-10-CM | POA: Diagnosis not present

## 2018-07-29 DIAGNOSIS — J3081 Allergic rhinitis due to animal (cat) (dog) hair and dander: Secondary | ICD-10-CM | POA: Diagnosis not present

## 2018-07-29 DIAGNOSIS — J301 Allergic rhinitis due to pollen: Secondary | ICD-10-CM | POA: Diagnosis not present

## 2018-08-12 DIAGNOSIS — J301 Allergic rhinitis due to pollen: Secondary | ICD-10-CM | POA: Diagnosis not present

## 2018-08-12 DIAGNOSIS — J3089 Other allergic rhinitis: Secondary | ICD-10-CM | POA: Diagnosis not present

## 2018-08-12 DIAGNOSIS — J3081 Allergic rhinitis due to animal (cat) (dog) hair and dander: Secondary | ICD-10-CM | POA: Diagnosis not present

## 2018-08-12 DIAGNOSIS — M542 Cervicalgia: Secondary | ICD-10-CM | POA: Diagnosis not present

## 2018-08-21 DIAGNOSIS — Z8371 Family history of colonic polyps: Secondary | ICD-10-CM | POA: Diagnosis not present

## 2018-08-21 DIAGNOSIS — K635 Polyp of colon: Secondary | ICD-10-CM | POA: Diagnosis not present

## 2018-08-21 DIAGNOSIS — K648 Other hemorrhoids: Secondary | ICD-10-CM | POA: Diagnosis not present

## 2018-08-21 DIAGNOSIS — Z1211 Encounter for screening for malignant neoplasm of colon: Secondary | ICD-10-CM | POA: Diagnosis not present

## 2018-08-21 DIAGNOSIS — D123 Benign neoplasm of transverse colon: Secondary | ICD-10-CM | POA: Diagnosis not present

## 2018-08-21 DIAGNOSIS — K5730 Diverticulosis of large intestine without perforation or abscess without bleeding: Secondary | ICD-10-CM | POA: Diagnosis not present

## 2018-08-21 DIAGNOSIS — K6289 Other specified diseases of anus and rectum: Secondary | ICD-10-CM | POA: Diagnosis not present

## 2018-09-04 DIAGNOSIS — J3089 Other allergic rhinitis: Secondary | ICD-10-CM | POA: Diagnosis not present

## 2018-09-04 DIAGNOSIS — J301 Allergic rhinitis due to pollen: Secondary | ICD-10-CM | POA: Diagnosis not present

## 2018-09-04 DIAGNOSIS — J0101 Acute recurrent maxillary sinusitis: Secondary | ICD-10-CM | POA: Diagnosis not present

## 2018-09-04 DIAGNOSIS — J4520 Mild intermittent asthma, uncomplicated: Secondary | ICD-10-CM | POA: Diagnosis not present

## 2018-09-04 DIAGNOSIS — J3081 Allergic rhinitis due to animal (cat) (dog) hair and dander: Secondary | ICD-10-CM | POA: Diagnosis not present

## 2018-09-13 DIAGNOSIS — J301 Allergic rhinitis due to pollen: Secondary | ICD-10-CM | POA: Diagnosis not present

## 2018-09-13 DIAGNOSIS — J3089 Other allergic rhinitis: Secondary | ICD-10-CM | POA: Diagnosis not present

## 2018-09-13 DIAGNOSIS — J3081 Allergic rhinitis due to animal (cat) (dog) hair and dander: Secondary | ICD-10-CM | POA: Diagnosis not present

## 2018-09-25 DIAGNOSIS — J301 Allergic rhinitis due to pollen: Secondary | ICD-10-CM | POA: Diagnosis not present

## 2018-09-25 DIAGNOSIS — J3081 Allergic rhinitis due to animal (cat) (dog) hair and dander: Secondary | ICD-10-CM | POA: Diagnosis not present

## 2018-09-25 DIAGNOSIS — J3089 Other allergic rhinitis: Secondary | ICD-10-CM | POA: Diagnosis not present

## 2018-10-04 DIAGNOSIS — M810 Age-related osteoporosis without current pathological fracture: Secondary | ICD-10-CM | POA: Diagnosis not present

## 2018-10-04 DIAGNOSIS — E785 Hyperlipidemia, unspecified: Secondary | ICD-10-CM | POA: Diagnosis not present

## 2018-10-04 DIAGNOSIS — R7303 Prediabetes: Secondary | ICD-10-CM | POA: Diagnosis not present

## 2018-10-04 DIAGNOSIS — F419 Anxiety disorder, unspecified: Secondary | ICD-10-CM | POA: Diagnosis not present

## 2018-10-09 DIAGNOSIS — J3089 Other allergic rhinitis: Secondary | ICD-10-CM | POA: Diagnosis not present

## 2018-10-09 DIAGNOSIS — J3081 Allergic rhinitis due to animal (cat) (dog) hair and dander: Secondary | ICD-10-CM | POA: Diagnosis not present

## 2018-10-09 DIAGNOSIS — J301 Allergic rhinitis due to pollen: Secondary | ICD-10-CM | POA: Diagnosis not present

## 2018-10-10 DIAGNOSIS — M810 Age-related osteoporosis without current pathological fracture: Secondary | ICD-10-CM | POA: Diagnosis not present

## 2018-10-10 DIAGNOSIS — Z780 Asymptomatic menopausal state: Secondary | ICD-10-CM | POA: Diagnosis not present

## 2018-10-10 DIAGNOSIS — M8588 Other specified disorders of bone density and structure, other site: Secondary | ICD-10-CM | POA: Diagnosis not present

## 2018-10-10 DIAGNOSIS — M8580 Other specified disorders of bone density and structure, unspecified site: Secondary | ICD-10-CM | POA: Diagnosis not present

## 2018-11-01 DIAGNOSIS — J3089 Other allergic rhinitis: Secondary | ICD-10-CM | POA: Diagnosis not present

## 2018-11-01 DIAGNOSIS — J3081 Allergic rhinitis due to animal (cat) (dog) hair and dander: Secondary | ICD-10-CM | POA: Diagnosis not present

## 2018-11-01 DIAGNOSIS — J301 Allergic rhinitis due to pollen: Secondary | ICD-10-CM | POA: Diagnosis not present

## 2018-11-13 DIAGNOSIS — J3081 Allergic rhinitis due to animal (cat) (dog) hair and dander: Secondary | ICD-10-CM | POA: Diagnosis not present

## 2018-11-13 DIAGNOSIS — J301 Allergic rhinitis due to pollen: Secondary | ICD-10-CM | POA: Diagnosis not present

## 2018-11-13 DIAGNOSIS — J3089 Other allergic rhinitis: Secondary | ICD-10-CM | POA: Diagnosis not present

## 2018-11-25 DIAGNOSIS — J3089 Other allergic rhinitis: Secondary | ICD-10-CM | POA: Diagnosis not present

## 2018-11-25 DIAGNOSIS — J3081 Allergic rhinitis due to animal (cat) (dog) hair and dander: Secondary | ICD-10-CM | POA: Diagnosis not present

## 2018-11-25 DIAGNOSIS — J301 Allergic rhinitis due to pollen: Secondary | ICD-10-CM | POA: Diagnosis not present

## 2018-12-09 DIAGNOSIS — J3081 Allergic rhinitis due to animal (cat) (dog) hair and dander: Secondary | ICD-10-CM | POA: Diagnosis not present

## 2018-12-09 DIAGNOSIS — J301 Allergic rhinitis due to pollen: Secondary | ICD-10-CM | POA: Diagnosis not present

## 2018-12-09 DIAGNOSIS — J3089 Other allergic rhinitis: Secondary | ICD-10-CM | POA: Diagnosis not present

## 2018-12-23 DIAGNOSIS — J3081 Allergic rhinitis due to animal (cat) (dog) hair and dander: Secondary | ICD-10-CM | POA: Diagnosis not present

## 2018-12-23 DIAGNOSIS — J301 Allergic rhinitis due to pollen: Secondary | ICD-10-CM | POA: Diagnosis not present

## 2018-12-23 DIAGNOSIS — J3089 Other allergic rhinitis: Secondary | ICD-10-CM | POA: Diagnosis not present

## 2019-01-06 DIAGNOSIS — J3081 Allergic rhinitis due to animal (cat) (dog) hair and dander: Secondary | ICD-10-CM | POA: Diagnosis not present

## 2019-01-06 DIAGNOSIS — J3089 Other allergic rhinitis: Secondary | ICD-10-CM | POA: Diagnosis not present

## 2019-01-06 DIAGNOSIS — J301 Allergic rhinitis due to pollen: Secondary | ICD-10-CM | POA: Diagnosis not present

## 2019-01-07 DIAGNOSIS — N816 Rectocele: Secondary | ICD-10-CM | POA: Diagnosis not present

## 2019-01-07 DIAGNOSIS — R351 Nocturia: Secondary | ICD-10-CM | POA: Diagnosis not present

## 2019-01-07 DIAGNOSIS — R350 Frequency of micturition: Secondary | ICD-10-CM | POA: Diagnosis not present

## 2019-01-07 DIAGNOSIS — N8110 Cystocele, unspecified: Secondary | ICD-10-CM | POA: Diagnosis not present

## 2019-01-20 DIAGNOSIS — J301 Allergic rhinitis due to pollen: Secondary | ICD-10-CM | POA: Diagnosis not present

## 2019-01-20 DIAGNOSIS — J3089 Other allergic rhinitis: Secondary | ICD-10-CM | POA: Diagnosis not present

## 2019-01-20 DIAGNOSIS — J3081 Allergic rhinitis due to animal (cat) (dog) hair and dander: Secondary | ICD-10-CM | POA: Diagnosis not present

## 2019-01-21 DIAGNOSIS — J3089 Other allergic rhinitis: Secondary | ICD-10-CM | POA: Diagnosis not present

## 2019-01-21 DIAGNOSIS — J3081 Allergic rhinitis due to animal (cat) (dog) hair and dander: Secondary | ICD-10-CM | POA: Diagnosis not present

## 2019-01-21 DIAGNOSIS — J301 Allergic rhinitis due to pollen: Secondary | ICD-10-CM | POA: Diagnosis not present

## 2019-01-24 DIAGNOSIS — R350 Frequency of micturition: Secondary | ICD-10-CM | POA: Diagnosis not present

## 2019-01-24 DIAGNOSIS — N952 Postmenopausal atrophic vaginitis: Secondary | ICD-10-CM | POA: Diagnosis not present

## 2019-01-24 DIAGNOSIS — N816 Rectocele: Secondary | ICD-10-CM | POA: Diagnosis not present

## 2019-01-24 DIAGNOSIS — R351 Nocturia: Secondary | ICD-10-CM | POA: Diagnosis not present

## 2019-02-03 DIAGNOSIS — J3089 Other allergic rhinitis: Secondary | ICD-10-CM | POA: Diagnosis not present

## 2019-02-03 DIAGNOSIS — J3081 Allergic rhinitis due to animal (cat) (dog) hair and dander: Secondary | ICD-10-CM | POA: Diagnosis not present

## 2019-02-03 DIAGNOSIS — J301 Allergic rhinitis due to pollen: Secondary | ICD-10-CM | POA: Diagnosis not present

## 2019-03-03 DIAGNOSIS — J3081 Allergic rhinitis due to animal (cat) (dog) hair and dander: Secondary | ICD-10-CM | POA: Diagnosis not present

## 2019-03-03 DIAGNOSIS — J3089 Other allergic rhinitis: Secondary | ICD-10-CM | POA: Diagnosis not present

## 2019-03-03 DIAGNOSIS — J301 Allergic rhinitis due to pollen: Secondary | ICD-10-CM | POA: Diagnosis not present

## 2019-03-11 DIAGNOSIS — M1611 Unilateral primary osteoarthritis, right hip: Secondary | ICD-10-CM | POA: Diagnosis not present

## 2019-03-11 DIAGNOSIS — M545 Low back pain: Secondary | ICD-10-CM | POA: Diagnosis not present

## 2019-03-11 DIAGNOSIS — M1711 Unilateral primary osteoarthritis, right knee: Secondary | ICD-10-CM | POA: Diagnosis not present

## 2019-03-31 DIAGNOSIS — J3081 Allergic rhinitis due to animal (cat) (dog) hair and dander: Secondary | ICD-10-CM | POA: Diagnosis not present

## 2019-03-31 DIAGNOSIS — J3089 Other allergic rhinitis: Secondary | ICD-10-CM | POA: Diagnosis not present

## 2019-03-31 DIAGNOSIS — J301 Allergic rhinitis due to pollen: Secondary | ICD-10-CM | POA: Diagnosis not present

## 2019-04-03 DIAGNOSIS — J3081 Allergic rhinitis due to animal (cat) (dog) hair and dander: Secondary | ICD-10-CM | POA: Diagnosis not present

## 2019-04-03 DIAGNOSIS — J3089 Other allergic rhinitis: Secondary | ICD-10-CM | POA: Diagnosis not present

## 2019-04-15 DIAGNOSIS — J3081 Allergic rhinitis due to animal (cat) (dog) hair and dander: Secondary | ICD-10-CM | POA: Diagnosis not present

## 2019-04-15 DIAGNOSIS — J3089 Other allergic rhinitis: Secondary | ICD-10-CM | POA: Diagnosis not present

## 2019-04-15 DIAGNOSIS — J301 Allergic rhinitis due to pollen: Secondary | ICD-10-CM | POA: Diagnosis not present

## 2019-04-17 DIAGNOSIS — J3081 Allergic rhinitis due to animal (cat) (dog) hair and dander: Secondary | ICD-10-CM | POA: Diagnosis not present

## 2019-04-17 DIAGNOSIS — J3089 Other allergic rhinitis: Secondary | ICD-10-CM | POA: Diagnosis not present

## 2019-04-23 DIAGNOSIS — J3089 Other allergic rhinitis: Secondary | ICD-10-CM | POA: Diagnosis not present

## 2019-04-23 DIAGNOSIS — J301 Allergic rhinitis due to pollen: Secondary | ICD-10-CM | POA: Diagnosis not present

## 2019-04-23 DIAGNOSIS — J3081 Allergic rhinitis due to animal (cat) (dog) hair and dander: Secondary | ICD-10-CM | POA: Diagnosis not present

## 2019-04-25 DIAGNOSIS — J3081 Allergic rhinitis due to animal (cat) (dog) hair and dander: Secondary | ICD-10-CM | POA: Diagnosis not present

## 2019-04-25 DIAGNOSIS — J3089 Other allergic rhinitis: Secondary | ICD-10-CM | POA: Diagnosis not present

## 2019-04-25 DIAGNOSIS — J301 Allergic rhinitis due to pollen: Secondary | ICD-10-CM | POA: Diagnosis not present

## 2019-04-29 DIAGNOSIS — J3081 Allergic rhinitis due to animal (cat) (dog) hair and dander: Secondary | ICD-10-CM | POA: Diagnosis not present

## 2019-04-29 DIAGNOSIS — J3089 Other allergic rhinitis: Secondary | ICD-10-CM | POA: Diagnosis not present

## 2019-04-29 DIAGNOSIS — J301 Allergic rhinitis due to pollen: Secondary | ICD-10-CM | POA: Diagnosis not present

## 2019-05-01 DIAGNOSIS — J301 Allergic rhinitis due to pollen: Secondary | ICD-10-CM | POA: Diagnosis not present

## 2019-05-05 DIAGNOSIS — J301 Allergic rhinitis due to pollen: Secondary | ICD-10-CM | POA: Diagnosis not present

## 2019-05-05 DIAGNOSIS — J3081 Allergic rhinitis due to animal (cat) (dog) hair and dander: Secondary | ICD-10-CM | POA: Diagnosis not present

## 2019-05-05 DIAGNOSIS — J3089 Other allergic rhinitis: Secondary | ICD-10-CM | POA: Diagnosis not present

## 2019-05-08 DIAGNOSIS — H353131 Nonexudative age-related macular degeneration, bilateral, early dry stage: Secondary | ICD-10-CM | POA: Diagnosis not present

## 2019-05-08 DIAGNOSIS — H43812 Vitreous degeneration, left eye: Secondary | ICD-10-CM | POA: Diagnosis not present

## 2019-05-08 DIAGNOSIS — Z961 Presence of intraocular lens: Secondary | ICD-10-CM | POA: Diagnosis not present

## 2019-05-12 ENCOUNTER — Emergency Department (HOSPITAL_BASED_OUTPATIENT_CLINIC_OR_DEPARTMENT_OTHER)
Admission: EM | Admit: 2019-05-12 | Discharge: 2019-05-12 | Disposition: A | Discharge status: Home / Self Care | Payer: Medicare Other | Attending: Emergency Medicine | Admitting: Emergency Medicine

## 2019-05-12 ENCOUNTER — Other Ambulatory Visit: Payer: Self-pay

## 2019-05-12 ENCOUNTER — Emergency Department (HOSPITAL_BASED_OUTPATIENT_CLINIC_OR_DEPARTMENT_OTHER): Payer: Medicare Other

## 2019-05-12 DIAGNOSIS — N132 Hydronephrosis with renal and ureteral calculous obstruction: Secondary | ICD-10-CM | POA: Diagnosis not present

## 2019-05-12 DIAGNOSIS — Z881 Allergy status to other antibiotic agents status: Secondary | ICD-10-CM | POA: Diagnosis not present

## 2019-05-12 DIAGNOSIS — N200 Calculus of kidney: Secondary | ICD-10-CM | POA: Diagnosis not present

## 2019-05-12 DIAGNOSIS — Z79899 Other long term (current) drug therapy: Secondary | ICD-10-CM | POA: Insufficient documentation

## 2019-05-12 DIAGNOSIS — R1032 Left lower quadrant pain: Secondary | ICD-10-CM | POA: Diagnosis present

## 2019-05-12 DIAGNOSIS — Z20828 Contact with and (suspected) exposure to other viral communicable diseases: Secondary | ICD-10-CM | POA: Insufficient documentation

## 2019-05-12 DIAGNOSIS — Z03818 Encounter for observation for suspected exposure to other biological agents ruled out: Secondary | ICD-10-CM | POA: Diagnosis not present

## 2019-05-12 LAB — COMPREHENSIVE METABOLIC PANEL
ALT: 19 U/L (ref 0–44)
AST: 29 U/L (ref 15–41)
Albumin: 4.2 g/dL (ref 3.5–5.0)
Alkaline Phosphatase: 67 U/L (ref 38–126)
Anion gap: 11 (ref 5–15)
BUN: 19 mg/dL (ref 8–23)
CO2: 26 mmol/L (ref 22–32)
Calcium: 9.3 mg/dL (ref 8.9–10.3)
Chloride: 98 mmol/L (ref 98–111)
Creatinine, Ser: 0.83 mg/dL (ref 0.44–1.00)
GFR calc Af Amer: >60 mL/min (ref >60)
GFR calc non Af Amer: >60 mL/min (ref >60)
Glucose, Bld: 102 mg/dL — ABNORMAL HIGH (ref 70–99)
Potassium: 3.6 mmol/L (ref 3.5–5.1)
Sodium: 135 mmol/L (ref 135–145)
Total Bilirubin: 0.6 mg/dL (ref 0.3–1.2)
Total Protein: 7.3 g/dL (ref 6.5–8.1)

## 2019-05-12 LAB — CBC WITH DIFFERENTIAL/PLATELET
Abs Immature Granulocytes: 0.01 10*3/uL (ref 0.00–0.07)
Basophils Absolute: 0 10*3/uL (ref 0.0–0.1)
Basophils Relative: 0 %
Eosinophils Absolute: 0.1 10*3/uL (ref 0.0–0.5)
Eosinophils Relative: 1 %
HCT: 43.4 % (ref 36.0–46.0)
Hemoglobin: 13.8 g/dL (ref 12.0–15.0)
Immature Granulocytes: 0 %
Lymphocytes Relative: 15 %
Lymphs Abs: 1.2 10*3/uL (ref 0.7–4.0)
MCH: 28.9 pg (ref 26.0–34.0)
MCHC: 31.8 g/dL (ref 30.0–36.0)
MCV: 90.8 fL (ref 80.0–100.0)
Monocytes Absolute: 0.6 10*3/uL (ref 0.1–1.0)
Monocytes Relative: 8 %
Neutro Abs: 5.7 10*3/uL (ref 1.7–7.7)
Neutrophils Relative %: 76 %
Platelets: 343 10*3/uL (ref 150–400)
RBC: 4.78 MIL/uL (ref 3.87–5.11)
RDW: 13.2 % (ref 11.5–15.5)
WBC: 7.6 10*3/uL (ref 4.0–10.5)
nRBC: 0 % (ref 0.0–0.2)

## 2019-05-12 LAB — LIPASE, BLOOD: Lipase: 22 U/L (ref 11–51)

## 2019-05-12 LAB — SARS CORONAVIRUS 2 BY RT PCR (HOSPITAL ORDER, PERFORMED IN ~~LOC~~ HOSPITAL LAB): SARS Coronavirus 2: NEGATIVE

## 2019-05-12 LAB — URINALYSIS, ROUTINE W REFLEX MICROSCOPIC
Bilirubin Urine: NEGATIVE
Glucose, UA: NEGATIVE mg/dL
Ketones, ur: 15 mg/dL — AB
Leukocytes,Ua: NEGATIVE
Nitrite: NEGATIVE
Protein, ur: NEGATIVE mg/dL
Specific Gravity, Urine: 1.015 (ref 1.005–1.030)
pH: 8 (ref 5.0–8.0)

## 2019-05-12 LAB — URINALYSIS, MICROSCOPIC (REFLEX): Squamous Epithelial / HPF: NONE SEEN (ref 0–5)

## 2019-05-12 MED ORDER — ONDANSETRON HCL 4 MG/2ML IJ SOLN
4.0000 mg | Freq: Once | INTRAMUSCULAR | Status: AC
  Administered 2019-05-12: 4 mg via INTRAVENOUS
  Filled 2019-05-12: qty 2

## 2019-05-12 MED ORDER — FENTANYL CITRATE (PF) 100 MCG/2ML IJ SOLN
50.0000 ug | Freq: Once | INTRAMUSCULAR | Status: AC
  Administered 2019-05-12: 50 ug via INTRAVENOUS
  Filled 2019-05-12: qty 2

## 2019-05-12 MED ORDER — KETOROLAC TROMETHAMINE 15 MG/ML IJ SOLN
15.0000 mg | Freq: Once | INTRAMUSCULAR | Status: AC
  Administered 2019-05-12: 15 mg via INTRAVENOUS
  Filled 2019-05-12: qty 1

## 2019-05-12 MED ORDER — MORPHINE SULFATE (PF) 4 MG/ML IV SOLN
4.0000 mg | Freq: Once | INTRAVENOUS | Status: AC
  Administered 2019-05-12: 21:00:00 4 mg via INTRAVENOUS
  Filled 2019-05-12: qty 1

## 2019-05-12 MED ORDER — MORPHINE SULFATE (PF) 4 MG/ML IV SOLN
4.0000 mg | Freq: Once | INTRAVENOUS | Status: AC
  Administered 2019-05-12: 4 mg via INTRAVENOUS

## 2019-05-12 MED ORDER — SODIUM CHLORIDE 0.9 % IV BOLUS
1000.0000 mL | Freq: Once | INTRAVENOUS | Status: AC
  Administered 2019-05-12: 20:00:00 1000 mL via INTRAVENOUS

## 2019-05-12 MED ORDER — TAMSULOSIN HCL 0.4 MG PO CAPS
0.4000 mg | ORAL_CAPSULE | Freq: Every day | ORAL | Status: DC
  Administered 2019-05-12: 22:00:00 0.4 mg via ORAL
  Filled 2019-05-12: qty 1

## 2019-05-12 MED ORDER — MORPHINE SULFATE (PF) 4 MG/ML IV SOLN
INTRAVENOUS | Status: AC
  Filled 2019-05-12: qty 1

## 2019-05-12 MED ORDER — TAMSULOSIN HCL 0.4 MG PO CAPS: 0.4000 mg | ORAL_CAPSULE | Freq: Every day | ORAL | 0 refills | Status: AC

## 2019-05-12 MED ORDER — ONDANSETRON HCL 4 MG PO TABS: 4.0000 mg | ORAL_TABLET | Freq: Four times a day (QID) | ORAL | 0 refills | Status: AC

## 2019-05-12 MED ORDER — OXYCODONE HCL 5 MG PO TABS: 5.0000 mg | ORAL_TABLET | Freq: Four times a day (QID) | ORAL | 0 refills | Status: DC | PRN

## 2019-05-12 MED ORDER — OXYCODONE HCL 5 MG PO TABS
5.0000 mg | ORAL_TABLET | Freq: Once | ORAL | Status: AC
  Administered 2019-05-12: 23:00:00 5 mg via ORAL
  Filled 2019-05-12: qty 1

## 2019-05-12 NOTE — ED Triage Notes (Signed)
Pt. Reports lower abd. Pain started this morning at 4am.  She went to the Chiropractor and felt some better due to she was also having pain in her back.  Pt. Saying now most pain in the lower abd. Just above the pubic areal  Pt. Having no trouble urinating but hurts to walkand sit.

## 2019-05-12 NOTE — ED Notes (Signed)
Assisted patient to the restroom. Continues to c/o of pain.  Voids without any difficulty.

## 2019-05-12 NOTE — ED Provider Notes (Signed)
[facility] EMERGENCY DEPARTMENT Provider Note   CSN: [id number] Arrival date & time: 05/12/19  [id number]     History   Chief Complaint Chief Complaint  Patient presents with  . Abdominal Pain    HPI Brenda Delgado is a 73 y.o. female.     The history is provided by the patient.  Abdominal Pain Pain location:  L flank, LUQ and LLQ Pain quality: aching   Pain radiates to:  Does not radiate Pain severity:  Moderate Onset quality:  Sudden Timing:  Constant Progression:  Worsening Chronicity:  New Context: not medication withdrawal and not previous surgeries   Relieved by:  Nothing Worsened by:  Nothing Associated symptoms: nausea   Associated symptoms: no anorexia, no chest pain, no chills, no constipation, no cough, no dysuria, no fever, no hematuria, no shortness of breath, no sore throat and no vomiting   Risk factors: has not had multiple surgeries     Past Medical History:  Diagnosis Date  . PONV (postoperative nausea and vomiting)     Patient Active Problem List   Diagnosis Date Noted  . METATARSALGIA 04/15/2008  . FOOT PAIN, BILATERAL 04/15/2008    Past Surgical History:  Procedure Laterality Date  . arthroscopic knee  2005  . CARPAL TUNNEL RELEASE  2000  . [place] SURGERY  2010  . CHOLECYSTECTOMY  90's  . CYSTOCELE REPAIR  09/11/2011   Procedure: ANTERIOR REPAIR (CYSTOCELE);  Surgeon: Olga Millers, MD;  Location: [facility];  Service: Gynecology;  Laterality: N/A;  . CYSTOSCOPY  09/11/2011   Procedure: CYSTOSCOPY;  Surgeon: Olga Millers, MD;  Location: [facility];  Service: Gynecology;  Laterality: N/A;  . DILATION AND CURETTAGE OF UTERUS    . SALPINGOOPHORECTOMY  09/11/2011   Procedure: SALPINGO OOPHERECTOMY;  Surgeon: Olga Millers, MD;  Location: [facility];  Service: Gynecology;  Laterality: Bilateral;  . VAGINAL HYSTERECTOMY  09/11/2011   Procedure: HYSTERECTOMY VAGINAL;  Surgeon: Olga Millers, MD;  Location: [facility];  Service: Gynecology;   Laterality: N/A;  . [place] BIOPSY  09/11/2011   Procedure: VULVAR BIOPSY;  Surgeon: Olga Millers, MD;  Location: [facility];  Service: Gynecology;  Laterality: Left;  sebaceous cyst excision left vula  . WRIST SURGERY  2005   nerve surgery in wrist     OB History   No obstetric history on file.      Home Medications    Prior to Admission medications   Medication Sig Start Date End Date Taking? Authorizing Provider  Calcium-Vitamin D-Vitamin K [id number] MG-UNT-MCG CHEW Chew 2 tablets by mouth daily.    [provider]  ondansetron (ZOFRAN) 4 MG tablet Take 1 tablet (4 mg total) by mouth every 6 (six) hours for 20 doses. 05/12/19 05/17/19  Yovani Cogburn, DO  oxyCODONE (ROXICODONE) 5 MG immediate release tablet Take 1 tablet (5 mg total) by mouth every 6 (six) hours as needed for up to 15 doses for severe pain. 05/12/19   Zyiah Withington, DO  pantoprazole (PROTONIX) 40 MG tablet Take 40 mg by mouth daily.    [provider]  PARoxetine (PAXIL) 30 MG tablet Take 15 mg by mouth every morning.    [provider]  pravastatin (PRAVACHOL) 40 MG tablet Take 40 mg by mouth daily.    [provider]  tamsulosin (FLOMAX) 0.4 MG CAPS capsule Take 1 capsule (0.4 mg total) by mouth daily after supper for 5 days. 05/12/19 05/17/19  Lennice Sites, DO  Family History No family history on file.  Social History Social History   Tobacco Use  . Smoking status: Never Smoker  . Smokeless tobacco: Never Used  Substance Use Topics  . Alcohol use: No    Comment: rare  . Drug use: No     Allergies   Erythromycin and Iohexol   Review of Systems Review of Systems  Constitutional: Negative for chills and fever.  HENT: Negative for ear pain and sore throat.   Eyes: Negative for pain and visual disturbance.  Respiratory: Negative for cough and shortness of breath.   Cardiovascular: Negative for chest pain and palpitations.  Gastrointestinal: Positive for  abdominal pain and nausea. Negative for abdominal distention, anal bleeding, anorexia, constipation and vomiting.  Genitourinary: Negative for decreased urine volume, difficulty urinating, dysuria, hematuria and urgency.  Musculoskeletal: Negative for arthralgias and back pain.  Skin: Negative for color change and rash.  Neurological: Negative for seizures and syncope.  All other systems reviewed and are negative.    Physical Exam Updated Vital Signs  ED Triage Vitals  Enc Vitals Group     BP 05/12/19 1844 95/84     Pulse Rate 05/12/19 1844 66     Resp 05/12/19 1844 20     Temp 05/12/19 1844 98 F (36.7 C)     Temp Source 05/12/19 1844 Oral     SpO2 05/12/19 1844 97 %     Weight 05/12/19 1847 118 lb (53.5 kg)     Height 05/12/19 1847 4\' 11"  (1.499 m)     Head Circumference --      Peak Flow --      Pain Score 05/12/19 1846 10     Pain Loc --      Pain Edu? --      Excl. in [place]? --     Physical Exam Vitals signs and nursing note reviewed.  Constitutional:      General: She is not in acute distress.    Appearance: She is well-developed. She is not ill-appearing.  HENT:     Head: Normocephalic and atraumatic.  Eyes:     Extraocular Movements: Extraocular movements intact.     Conjunctiva/sclera: Conjunctivae normal.     Pupils: Pupils are equal, round, and reactive to light.  Neck:     Musculoskeletal: Neck supple.  Cardiovascular:     Rate and Rhythm: Normal rate and regular rhythm.     Heart sounds: Normal heart sounds. No murmur.  Pulmonary:     Effort: Pulmonary effort is normal. No respiratory distress.     Breath sounds: Normal breath sounds.  Abdominal:     General: Abdomen is flat. Bowel sounds are normal. There is no distension.     Palpations: Abdomen is soft.     Tenderness: There is abdominal tenderness in the left upper quadrant and left lower quadrant. There is no right CVA tenderness, left CVA tenderness, guarding or rebound.  Genitourinary:     Uterus: Normal.      Rectum: Normal.  Skin:    General: Skin is warm and dry.     Capillary Refill: Capillary refill takes less than 2 seconds.  Neurological:     Mental Status: She is alert.      ED Treatments / Results  Labs (all labs ordered are listed, but only abnormal results are displayed) Labs Reviewed  URINALYSIS, ROUTINE W REFLEX MICROSCOPIC - Abnormal; Notable for the following components:      Result Value   Hgb  urine dipstick TRACE (*)    Ketones, ur 15 (*)    All other components within normal limits  URINALYSIS, MICROSCOPIC (REFLEX) - Abnormal; Notable for the following components:   Bacteria, UA FEW (*)    All other components within normal limits  COMPREHENSIVE METABOLIC PANEL - Abnormal; Notable for the following components:   Glucose, Bld 102 (*)    All other components within normal limits  SARS CORONAVIRUS 2 (HOSPITAL ORDER, [facility])  CBC WITH DIFFERENTIAL/PLATELET  LIPASE, BLOOD    EKG None  Radiology Ct Abdomen Pelvis Wo Contrast  Result Date: 05/12/2019 CLINICAL DATA:  Low abdominal pain since this morning. Previous cholecystectomy and hysterectomy. EXAM: CT ABDOMEN AND PELVIS WITHOUT CONTRAST TECHNIQUE: Multidetector CT imaging of the abdomen and pelvis was performed following the standard protocol without IV contrast. COMPARISON:  None. FINDINGS: Lower chest: Clear lung bases. No significant pleural or pericardial effusion. Hepatobiliary: No focal hepatic abnormalities are identified on noncontrast imaging. Mild extrahepatic biliary dilatation is within physiologic limits post cholecystectomy. No calcified choledocholithiasis. Pancreas: Unremarkable. No pancreatic ductal dilatation or surrounding inflammatory changes. Spleen: Normal in size without focal abnormality. Adrenals/Urinary Tract: Both adrenal glands appear normal. There is bilateral nephrolithiasis, larger and more numerous on the left. There is moderate  left-sided hydronephrosis and proximal hydroureter secondary to an obstructing calculus in the proximal left ureter. This measures up to 8 mm in diameter and is visible on the scout image at the L2-3 level. No right-sided hydronephrosis. The bladder appears normal. Stomach/Bowel: No evidence of bowel wall thickening, distention or surrounding inflammatory change. There are diverticular changes throughout the distal colon. The appendix appears normal. Vascular/Lymphatic: There are no enlarged abdominal or pelvic lymph nodes. Mild aortic and branch vessel atherosclerosis. Reproductive: Hysterectomy. No adnexal mass. Probable pelvic floor laxity. Other: Small umbilical hernia containing only fat.  No ascites. Musculoskeletal: No acute or significant osseous findings. There are prominent facet degenerative changes within the lower lumbar spine. Degenerative changes and subchondral sclerosis are present at the sacroiliac joints and symphysis pubis as well. IMPRESSION: 1. Obstructing 8 mm calculus in the proximal left ureter with associated hydronephrosis. 2. Additional bilateral nephrolithiasis. 3. Additional incidental findings including distal colonic diverticulosis and Aortic Atherosclerosis (ICD10-I70.0). Electronically Signed   By: Richardean Sale M.D.   On: 05/12/2019 19:48    Procedures Procedures (including critical care time)  Medications Ordered in ED Medications  tamsulosin (FLOMAX) capsule 0.4 mg (0.4 mg Oral Given 05/12/19 2219)  oxyCODONE (Oxy IR/ROXICODONE) immediate release tablet 5 mg (has no administration in time range)  sodium chloride 0.9 % bolus 1,000 mL (0 mLs Intravenous Stopped 05/12/19 2056)  ondansetron (ZOFRAN) injection 4 mg (4 mg Intravenous Given 05/12/19 1947)  fentaNYL (SUBLIMAZE) injection 50 mcg (50 mcg Intravenous Given 05/12/19 1947)  morphine 4 MG/ML injection 4 mg (4 mg Intravenous Given 05/12/19 2035)  morphine 4 MG/ML injection 4 mg (4 mg Intravenous Given 05/12/19 2124)   ketorolac (TORADOL) 15 MG/ML injection 15 mg (15 mg Intravenous Given 05/12/19 2219)     Initial Impression / Assessment and Plan / ED Course  I have reviewed the triage vital signs and the nursing notes.  Pertinent labs & imaging results that were available during my care of the patient were reviewed by me and considered in my medical decision making (see chart for details).     Brenda Delgado is a 73 year old female with no significant medical history who presents to the ED with left-sided abdominal  pain.  Patient normal vitals.  No fever.  Pain ongoing for the last several hours.  Has had nausea and vomiting.  Denies any diarrhea.  No urinary symptoms.  No trauma history.  No history of kidney stones.  No history of diverticulitis.  Mostly tender in the left side on abdominal exam.  Otherwise exam is unremarkable.  Will evaluate for UTI, kidney stone, diverticulitis, bowel obstruction.  Will give IV fluids, IV fentanyl for pain.  Will give IV Zofran for nausea.  Patient with 8 mm obstructing kidney stone on the left.  However no leukocytosis, normal kidney function.  No urinary tract infection.  Patient got several rounds IV morphine with some improvement of her pain.  Was able to get IV Toradol, Flomax with almost resolution of her pain.  Talked with Dr. Graylon Good with urology and overall patient has improvement of pain.  No concerning findings on lab work and urine studies.  Overall patient would like to attempt pain management at home and follow-up with urology outpatient for likely procedure.  No active narcotic scripts.  Will prescribe Roxicodone, Zofran, Flomax per urology recommendations.  Recommend Tylenol Motrin as needed for pain.  At this time no need for admission for pain control.  We will follow-up with urology outpatient.  Understands to return to the emergency department if worsening fever, chills, pain, nausea, vomiting.  Discharged in good condition.  This chart was dictated using  voice recognition software.  Despite best efforts to proofread,  errors can occur which can change the documentation meaning.    Final Clinical Impressions(s) / ED Diagnoses   Final diagnoses:  Kidney stone    ED Discharge Orders         Ordered    oxyCODONE (ROXICODONE) 5 MG immediate release tablet  Every 6 hours PRN     05/12/19 2231    tamsulosin (FLOMAX) 0.4 MG CAPS capsule  Daily after supper     05/12/19 2231    ondansetron (ZOFRAN) 4 MG tablet  Every 6 hours     05/12/19 2231           Lennice Sites, DO 05/12/19 2301

## 2019-05-13 DIAGNOSIS — N202 Calculus of kidney with calculus of ureter: Secondary | ICD-10-CM | POA: Diagnosis not present

## 2019-05-14 ENCOUNTER — Inpatient Hospital Stay (HOSPITAL_COMMUNITY): Payer: Medicare Other | Admitting: Anesthesiology

## 2019-05-14 ENCOUNTER — Observation Stay (HOSPITAL_COMMUNITY)
Admission: RE | Admit: 2019-05-14 | Discharge: 2019-05-15 | Disposition: A | Discharge status: Home / Self Care | Payer: Medicare Other | Source: Ambulatory Visit | Attending: Urology | Admitting: Urology

## 2019-05-14 ENCOUNTER — Other Ambulatory Visit: Payer: Self-pay

## 2019-05-14 ENCOUNTER — Encounter (HOSPITAL_COMMUNITY): Payer: Self-pay | Admitting: *Deleted

## 2019-05-14 ENCOUNTER — Other Ambulatory Visit: Payer: Self-pay | Admitting: Urology

## 2019-05-14 ENCOUNTER — Encounter (HOSPITAL_COMMUNITY)
Admission: RE | Disposition: A | Discharge status: Home / Self Care | Payer: Self-pay | Source: Ambulatory Visit | Attending: Urology

## 2019-05-14 ENCOUNTER — Inpatient Hospital Stay (HOSPITAL_COMMUNITY): Payer: Medicare Other

## 2019-05-14 DIAGNOSIS — Z888 Allergy status to other drugs, medicaments and biological substances status: Secondary | ICD-10-CM | POA: Diagnosis not present

## 2019-05-14 DIAGNOSIS — N201 Calculus of ureter: Principal | ICD-10-CM | POA: Diagnosis present

## 2019-05-14 DIAGNOSIS — Z881 Allergy status to other antibiotic agents status: Secondary | ICD-10-CM | POA: Diagnosis not present

## 2019-05-14 DIAGNOSIS — N390 Urinary tract infection, site not specified: Secondary | ICD-10-CM | POA: Diagnosis not present

## 2019-05-14 DIAGNOSIS — Z79899 Other long term (current) drug therapy: Secondary | ICD-10-CM | POA: Insufficient documentation

## 2019-05-14 DIAGNOSIS — Z885 Allergy status to narcotic agent status: Secondary | ICD-10-CM | POA: Diagnosis not present

## 2019-05-14 DIAGNOSIS — Z880 Allergy status to penicillin: Secondary | ICD-10-CM | POA: Diagnosis not present

## 2019-05-14 DIAGNOSIS — R1084 Generalized abdominal pain: Secondary | ICD-10-CM | POA: Diagnosis not present

## 2019-05-14 DIAGNOSIS — Z882 Allergy status to sulfonamides status: Secondary | ICD-10-CM | POA: Diagnosis not present

## 2019-05-14 DIAGNOSIS — M79672 Pain in left foot: Secondary | ICD-10-CM | POA: Diagnosis not present

## 2019-05-14 DIAGNOSIS — R509 Fever, unspecified: Secondary | ICD-10-CM | POA: Diagnosis not present

## 2019-05-14 DIAGNOSIS — N202 Calculus of kidney with calculus of ureter: Secondary | ICD-10-CM | POA: Diagnosis not present

## 2019-05-14 DIAGNOSIS — M79671 Pain in right foot: Secondary | ICD-10-CM | POA: Diagnosis not present

## 2019-05-14 HISTORY — PX: CYSTOSCOPY WITH STENT PLACEMENT: SHX5790

## 2019-05-14 SURGERY — CYSTOSCOPY, WITH STENT INSERTION
Anesthesia: General | Site: Ureter | Laterality: Left

## 2019-05-14 MED ORDER — CIPROFLOXACIN HCL 500 MG PO TABS
500.0000 mg | ORAL_TABLET | Freq: Two times a day (BID) | ORAL | Status: DC
  Administered 2019-05-15: 500 mg via ORAL
  Filled 2019-05-14: qty 1

## 2019-05-14 MED ORDER — PROPOFOL 10 MG/ML IV BOLUS
INTRAVENOUS | Status: DC | PRN
  Administered 2019-05-14: 100 mg via INTRAVENOUS

## 2019-05-14 MED ORDER — FENTANYL CITRATE (PF) 100 MCG/2ML IJ SOLN
INTRAMUSCULAR | Status: AC
  Filled 2019-05-14: qty 2

## 2019-05-14 MED ORDER — CIPROFLOXACIN IN D5W 400 MG/200ML IV SOLN
400.0000 mg | INTRAVENOUS | Status: AC
  Administered 2019-05-14: 400 mg via INTRAVENOUS
  Filled 2019-05-14: qty 200

## 2019-05-14 MED ORDER — EPHEDRINE 5 MG/ML INJ
INTRAVENOUS | Status: AC
  Filled 2019-05-14: qty 10

## 2019-05-14 MED ORDER — ONDANSETRON HCL 4 MG/2ML IJ SOLN
INTRAMUSCULAR | Status: AC
  Filled 2019-05-14: qty 2

## 2019-05-14 MED ORDER — DEXAMETHASONE SODIUM PHOSPHATE 10 MG/ML IJ SOLN
INTRAMUSCULAR | Status: AC
  Filled 2019-05-14: qty 1

## 2019-05-14 MED ORDER — ONDANSETRON HCL 4 MG/2ML IJ SOLN
INTRAMUSCULAR | Status: DC | PRN
  Administered 2019-05-14: 4 mg via INTRAVENOUS

## 2019-05-14 MED ORDER — ACETAMINOPHEN 10 MG/ML IV SOLN
INTRAVENOUS | Status: AC
  Filled 2019-05-14: qty 100

## 2019-05-14 MED ORDER — IOHEXOL 300 MG/ML  SOLN
INTRAMUSCULAR | Status: DC | PRN
  Administered 2019-05-14: 3 mL via URETHRAL

## 2019-05-14 MED ORDER — OXYCODONE HCL 5 MG PO TABS: 5.0000 mg | ORAL_TABLET | Freq: Four times a day (QID) | ORAL | Status: DC | PRN

## 2019-05-14 MED ORDER — LIDOCAINE 2% (20 MG/ML) 5 ML SYRINGE
INTRAMUSCULAR | Status: DC | PRN
  Administered 2019-05-14: 50 mg via INTRAVENOUS

## 2019-05-14 MED ORDER — PROMETHAZINE HCL 25 MG/ML IJ SOLN: 6.2500 mg | INTRAMUSCULAR | Status: DC | PRN

## 2019-05-14 MED ORDER — FENTANYL CITRATE (PF) 100 MCG/2ML IJ SOLN
INTRAMUSCULAR | Status: DC | PRN
  Administered 2019-05-14: 50 ug via INTRAVENOUS
  Administered 2019-05-14 (×2): 25 ug via INTRAVENOUS

## 2019-05-14 MED ORDER — KCL IN DEXTROSE-NACL 20-5-0.45 MEQ/L-%-% IV SOLN
INTRAVENOUS | Status: DC
  Administered 2019-05-14: 21:00:00 via INTRAVENOUS
  Filled 2019-05-14 (×2): qty 1000

## 2019-05-14 MED ORDER — OXYCODONE HCL 5 MG PO TABS: 5.0000 mg | ORAL_TABLET | Freq: Once | ORAL | Status: DC | PRN

## 2019-05-14 MED ORDER — SUCCINYLCHOLINE CHLORIDE 200 MG/10ML IV SOSY
PREFILLED_SYRINGE | INTRAVENOUS | Status: DC | PRN
  Administered 2019-05-14: 80 mg via INTRAVENOUS

## 2019-05-14 MED ORDER — LIDOCAINE 2% (20 MG/ML) 5 ML SYRINGE
INTRAMUSCULAR | Status: AC
  Filled 2019-05-14: qty 5

## 2019-05-14 MED ORDER — PROPOFOL 10 MG/ML IV BOLUS
INTRAVENOUS | Status: AC
  Filled 2019-05-14: qty 20

## 2019-05-14 MED ORDER — ONDANSETRON HCL 4 MG PO TABS
4.0000 mg | ORAL_TABLET | Freq: Four times a day (QID) | ORAL | Status: DC
  Administered 2019-05-14: 21:00:00 4 mg via ORAL
  Filled 2019-05-14 (×2): qty 1

## 2019-05-14 MED ORDER — PRAVASTATIN SODIUM 40 MG PO TABS
40.0000 mg | ORAL_TABLET | Freq: Every day | ORAL | Status: DC
  Filled 2019-05-14: qty 1

## 2019-05-14 MED ORDER — FENTANYL CITRATE (PF) 100 MCG/2ML IJ SOLN: 25.0000 ug | INTRAMUSCULAR | Status: DC | PRN

## 2019-05-14 MED ORDER — LACTATED RINGERS IV SOLN
INTRAVENOUS | Status: DC
  Administered 2019-05-14: 17:00:00 via INTRAVENOUS

## 2019-05-14 MED ORDER — SODIUM CHLORIDE 0.9 % IR SOLN
Status: DC | PRN
  Administered 2019-05-14: 3000 mL via INTRAVESICAL

## 2019-05-14 MED ORDER — PANTOPRAZOLE SODIUM 40 MG PO TBEC
40.0000 mg | DELAYED_RELEASE_TABLET | Freq: Every day | ORAL | Status: DC
  Administered 2019-05-15: 40 mg via ORAL
  Filled 2019-05-14: qty 1

## 2019-05-14 MED ORDER — DEXAMETHASONE SODIUM PHOSPHATE 10 MG/ML IJ SOLN
INTRAMUSCULAR | Status: DC | PRN
  Administered 2019-05-14: 10 mg via INTRAVENOUS

## 2019-05-14 MED ORDER — ACETAMINOPHEN 10 MG/ML IV SOLN
1000.0000 mg | Freq: Once | INTRAVENOUS | Status: AC
  Administered 2019-05-14: 19:00:00 1000 mg via INTRAVENOUS

## 2019-05-14 MED ORDER — EPHEDRINE SULFATE-NACL 50-0.9 MG/10ML-% IV SOSY
PREFILLED_SYRINGE | INTRAVENOUS | Status: DC | PRN
  Administered 2019-05-14: 10 mg via INTRAVENOUS

## 2019-05-14 MED ORDER — ESMOLOL HCL 100 MG/10ML IV SOLN
INTRAVENOUS | Status: DC | PRN
  Administered 2019-05-14: 10 mg via INTRAVENOUS

## 2019-05-14 MED ORDER — PAROXETINE HCL 10 MG PO TABS
15.0000 mg | ORAL_TABLET | Freq: Every day | ORAL | Status: DC
  Administered 2019-05-15: 10:00:00 15 mg via ORAL
  Filled 2019-05-14: qty 1.5

## 2019-05-14 MED ORDER — ESMOLOL HCL 100 MG/10ML IV SOLN
INTRAVENOUS | Status: AC
  Filled 2019-05-14: qty 10

## 2019-05-14 MED ORDER — DIPHENHYDRAMINE HCL 50 MG/ML IJ SOLN
INTRAMUSCULAR | Status: DC | PRN
  Administered 2019-05-14: 12.5 mg via INTRAVENOUS

## 2019-05-14 MED ORDER — OXYCODONE HCL 5 MG/5ML PO SOLN: 5.0000 mg | Freq: Once | ORAL | Status: DC | PRN

## 2019-05-14 SURGICAL SUPPLY — 12 items
BAG URO CATCHER STRL LF (MISCELLANEOUS) ×2 IMPLANT
CATH INTERMIT  6FR 70CM (CATHETERS) ×2 IMPLANT
CLOTH BEACON ORANGE TIMEOUT ST (SAFETY) ×2 IMPLANT
COVER WAND RF STERILE (DRAPES) IMPLANT
GLOVE BIOGEL M STRL SZ7.5 (GLOVE) ×4 IMPLANT
GOWN STRL REUS W/TWL LRG LVL3 (GOWN DISPOSABLE) ×4 IMPLANT
GUIDEWIRE STR DUAL SENSOR (WIRE) ×2 IMPLANT
KIT TURNOVER KIT A (KITS) ×1 IMPLANT
MANIFOLD NEPTUNE II (INSTRUMENTS) ×2 IMPLANT
PACK CYSTO (CUSTOM PROCEDURE TRAY) ×2 IMPLANT
STENT URET 6FRX22 CONTOUR (STENTS) ×1 IMPLANT
TUBING CONNECTING 10 (TUBING) IMPLANT

## 2019-05-14 NOTE — Anesthesia Procedure Notes (Signed)
Procedure Name: Intubation Date/Time: 05/14/2019 6:00 PM Performed by: West Pugh, CRNA Pre-anesthesia Checklist: Patient identified, Emergency Drugs available, Suction available, Patient being monitored and Timeout performed Patient Re-evaluated:Patient Re-evaluated prior to induction Oxygen Delivery Method: Circle system utilized Preoxygenation: Pre-oxygenation with 100% oxygen Induction Type: IV induction, Cricoid Pressure applied and Rapid sequence Laryngoscope Size: Mac and 3 Grade View: Grade I Tube type: Oral Tube size: 7.0 mm Number of attempts: 1 Airway Equipment and Method: Stylet Placement Confirmation: ETT inserted through vocal cords under direct vision,  positive ETCO2,  CO2 detector and breath sounds checked- equal and bilateral Secured at: 21 cm Tube secured with: Tape Dental Injury: Teeth and Oropharynx as per pre-operative assessment

## 2019-05-14 NOTE — Transfer of Care (Signed)
Immediate Anesthesia Transfer of Care Note  Patient: Brenda Delgado  Procedure(s) Performed: CYSTOSCOPY WITH STENT PLACEMENT, RETROGRADE (Left Ureter)  Patient Location: PACU  Anesthesia Type:General  Level of Consciousness: awake, alert , oriented and patient cooperative  Airway & Oxygen Therapy: Patient Spontanous Breathing and Patient connected to face mask oxygen  Post-op Assessment: Report given to RN and Post -op Vital signs reviewed and stable  Post vital signs: Reviewed and stable  Last Vitals:  Vitals Value Taken Time  BP 132/47 05/14/19 1836  Temp    Pulse 109 05/14/19 1839  Resp 20 05/14/19 1839  SpO2 95 % 05/14/19 1839  Vitals shown include unvalidated device data.  Last Pain:  Vitals:   05/14/19 1719  TempSrc: Oral  PainSc: 0-No pain         Complications: No apparent anesthesia complications

## 2019-05-14 NOTE — Interval H&P Note (Signed)
History and Physical Interval Note:  05/14/2019 5:39 PM  Brenda Delgado  has presented today for surgery, with the diagnosis of ureteral stone, left.  The various methods of treatment have been discussed with the patient and family. After consideration of risks, benefits and other options for treatment, the patient has consented to  Procedure(s): [place] (Left) as a surgical intervention.  The patient's history has been reviewed, patient examined, no change in status, stable for surgery.  I have reviewed the patient's chart and labs.  Questions were answered to the patient's satisfaction.     Viktoria Gruetzmacher A Rhen Dossantos

## 2019-05-14 NOTE — Anesthesia Preprocedure Evaluation (Addendum)
Anesthesia Evaluation  Patient identified by MRN, date of birth, ID band Patient awake    Reviewed: Allergy & Precautions, NPO status , Patient's Chart, lab work & pertinent test results  History of Anesthesia Complications (+) PONV  Airway Mallampati: II  TM Distance: >3 FB Neck ROM: Full    Dental no notable dental hx.    Pulmonary neg pulmonary ROS,    Pulmonary exam normal breath sounds clear to auscultation       Cardiovascular negative cardio ROS Normal cardiovascular exam Rhythm:Regular Rate:Normal     Neuro/Psych negative neurological ROS  negative psych ROS   GI/Hepatic negative GI ROS, Neg liver ROS,   Endo/Other  negative endocrine ROS  Renal/GU negative Renal ROS  negative genitourinary   Musculoskeletal negative musculoskeletal ROS (+)   Abdominal   Peds negative pediatric ROS (+)  Hematology negative hematology ROS (+)   Anesthesia Other Findings   Reproductive/Obstetrics negative OB ROS                             Anesthesia Physical Anesthesia Plan  ASA: II  Anesthesia Plan: General   Post-op Pain Management:    Induction: Intravenous and Rapid sequence  PONV Risk Score and Plan: 4 or greater and Ondansetron, Treatment may vary due to age or medical condition and Dexamethasone  Airway Management Planned: Oral ETT  Additional Equipment:   Intra-op Plan:   Post-operative Plan: Extubation in OR  Informed Consent: I have reviewed the patients History and Physical, chart, labs and discussed the procedure including the risks, benefits and alternatives for the proposed anesthesia with the patient or authorized representative who has indicated his/her understanding and acceptance.     Dental advisory given  Plan Discussed with: CRNA and Surgeon  Anesthesia Plan Comments: (Yogurt at 13:00)       Anesthesia Quick Evaluation

## 2019-05-14 NOTE — H&P (Signed)
1 - Left Ureteral / Bilateral Renal Stones - 67mm left UPJ stone by ER CT 05/2019 on eval flank pain. Stone is 14mm just below L2 TP on scout images. Also scattered ipsilaeral renal stoens (vol abtou 1.5cm total) and punctate Rt renal. Cr 0.83, UA without infectious parameters.   PMH sig for benign hyst, lap chole, neck surgery (no deficits). NO ischemic CV disease / blood thinners. Between PCP's at present. She retiered Automotive engineer.   Today "Brenda Delgado" returns today with complaints of left flank pain refractory  And fever.  She has chills.  She was worked up by Designer, jewellery and reviewed the x-rays demonstrating bilateral renal stones an 8 mm left stone at ureteropelvic junction.    Pros cons risks and sequelae of cysto retrograde stent discussed.  Percutaneous to discussed.  Ureteral injury discussed.  Many questions by has been answered.  Husband demeanor was noted    ALLERGIES: Erythromycin Penicillin    MEDICATIONS: Ketorolac Tromethamine 10 mg tablet 1 tablet PO Q6 prn pain for mild-moderate kidney stone pain  Percocet 5 mg-325 mg tablet 1 tablet PO Q6PRN Pain for breakthrough kidney stone pain  Calcium  Coq-10  Pantoprazole Sodium  Paroxetine Hcl  Pravastatin Sodium  Vitamin B-1  Vitamin D3     Notes: Patient states the pharmacy she went to did not have Ketorolac in stock and also did not have Percoet. Pharmacist substituted Oxycodone for Percocet.   GU PSH: No GU PSH    NON-GU PSH: Hysterectomy Neck Surgery (Unspecified) Remove Gallbladder Shoulder Arthroscopy/surgery Tonsillectomy     GU PMH: Renal and ureteral calculus (Chronic), Bilateral - 05/13/2019    NON-GU PMH: Arthritis Asthma GERD Hypercholesterolemia    FAMILY HISTORY: 1 son - Other Emphysema - Father Kidney Stones - Son   SOCIAL HISTORY: Marital Status: Married Ethnicity: Not Hispanic Or Latino; Race: White Current Smoking Status: Patient has never smoked.   Tobacco Use Assessment  Completed: Used Tobacco in last 30 days? Drinks 1 drink per month.  Drinks 2 caffeinated drinks per day. 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    REVIEW OF SYSTEMS:    GU Review Female:   Patient denies frequent urination, hard to postpone urination, burning /pain with urination, get up at night to urinate, leakage of urine, stream starts and stops, trouble starting your stream, have to strain to urinate, and being pregnant.  Gastrointestinal (Upper):   Patient denies nausea, vomiting, and indigestion/ heartburn.  Gastrointestinal (Lower):   Patient denies diarrhea and constipation.  Constitutional:   Patient denies night sweats, weight loss, and fatigue.  Skin:   Patient denies skin rash/ lesion and itching.  Eyes:   Patient denies blurred vision and double vision.  Ears/ Nose/ Throat:   Patient denies sore throat and sinus problems.  Hematologic/Lymphatic:   Patient denies swollen glands and easy bruising.  Cardiovascular:   Patient denies leg swelling and chest pains.  Respiratory:   Patient denies cough and shortness of breath.  Endocrine:   Patient denies excessive thirst.  Musculoskeletal:   Patient denies back pain and joint pain.  Neurological:   Patient denies headaches and dizziness.  Psychologic:   Patient denies depression and anxiety.   Notes: left lower abdominal pain, left flank pain    VITAL SIGNS:      05/14/2019 04:37 PM 05/14/2019 04:07 PM  BP   137/78 mmHg  Pulse   79 /min  Temperature 100.5 F / 38.0 C 99.7 F / 37.6 C   GU  PHYSICAL EXAMINATION:    Breast: Symmetrical. No tenderness, no nipple discharge, no skin changes. No mass.  Digital Rectal Exam: Normal sphincter tone. No rectal mass.  External Genitalia: No hirsutism, no rash, no scarring, no cyst, no erythematous lesion, no papular lesion, no blanched lesion, no warty lesion. No edema.  Urethral Meatus: Normal size. Normal position. No discharge.  Urethra: No tenderness, no mass, no scarring. No  hypermobility. No leakage.  Bladder: Normal to palpation, no tenderness, no mass, normal size.  Vagina: No atrophy, no stenosis. No rectocele. No cystocele. No enterocele.  Cervix: S/P Hysterectomy  Uterus: S/P Hysterectomy  Adnexa / Parametria: No tenderness. No adnexal mass. Normal left ovary. Normal right ovary.  Anus and Perineum: No hemorrhoids. No anal stenosis. No rectal fissure, no anal fissure. No edema, no dimple, no perineal tenderness, no anal tenderness.   MULTI-SYSTEM PHYSICAL EXAMINATION:    Constitutional: Well-nourished. No physical deformities. Normally developed. Good grooming.  Neck: Neck symmetrical, not swollen. Normal tracheal position.  Respiratory: No labored breathing, no use of accessory muscles.   Cardiovascular: Normal temperature, normal extremity pulses, no swelling, no varicosities.  Lymphatic: No enlargement of neck, axillae, groin.  Skin: No paleness, no jaundice, no cyanosis. No lesion, no ulcer, no rash.  Neurologic / Psychiatric: Oriented to time, oriented to place, oriented to person. No depression, no anxiety, no agitation.  Gastrointestinal: No mass, no tenderness, no rigidity, non obese abdomen.  Eyes: Normal conjunctivae. Normal eyelids.  Ears, Nose, Mouth, and Throat: Left ear no scars, no lesions, no masses. Right ear no scars, no lesions, no masses. Nose no scars, no lesions, no masses. Normal hearing. Normal lips.  Musculoskeletal: Normal gait and station of head and neck.     PAST DATA REVIEWED:  Source Of History:  Patient   PROCEDURES:          Urinalysis w/Scope Dipstick Dipstick Cont'd Micro  Color: Yellow Bilirubin: Neg mg/dL WBC/hpf: 10 - 20/hpf  Appearance: Clear Ketones: Trace mg/dL RBC/hpf: 3 - 10/hpf  Specific Gravity: 1.020 Blood: 1+ ery/uL Bacteria: Few (10-25/hpf)  pH: 6.0 Protein: Trace mg/dL Cystals: Ca Oxalate  Glucose: Neg mg/dL Urobilinogen: 0.2 mg/dL Casts: NS (Not Seen)    Nitrites: Neg Trichomonas: Not Present     Leukocyte Esterase: 1+ leu/uL Mucous: Not Present      Epithelial Cells: 6 - 10/hpf      Yeast: NS (Not Seen)      Sperm: Not Present         Morphine 4mg  - [id number], [id number] Site prepped with alcohol. Morphine given IM. Bandaid applied at site. Patient tolerated well.   Qty: 4 Adm. By: Alona Bene  Unit: mg Lot No   Route: IM Exp. Date   Freq: None Mfgr.:   Site: Right Buttock   ASSESSMENT: None   PLAN:            Medications Stop Meds: Pravastatin Sodium  Discontinue: 05/14/2019  - Reason: The medication cycle was completed.     After a thorough review of the management options for the patient's condition the patient  elected to proceed with surgical therapy as noted above. We have discussed the potential benefits and risks of the procedure, side effects of the proposed treatment, the likelihood of the patient achieving the goals of the procedure, and any potential problems that might occur during the procedure or recuperation. Informed consent has been obtained.

## 2019-05-14 NOTE — Op Note (Signed)
Preoperative diagnosis: Left ureteral stone with urinary tract infection Postoperative diagnosis: Left ureteral stone with urinary tract infection Surgery: Cystoscopy retrograde ureterogram and insertion of left ureteral stent Surgeon: Dr. Nicki Reaper Yoav Okane  The patient has the above diagnosis and consented to the above procedure.  Extra care was taken with leg positioning.  Because of her height I used a 22 cm x 6 French ureteral stent  Preoperative antibiotics were given  Initial cystoscopic examination demonstrated mild meatal stenosis.  Bladder mucosa and trigone were normal.  No cystitis.  Left ureteral orifice was a little bit small.  Under fluoroscopic guidance I tried to pass a sensor wire and was surprised that it deflected and met obstruction approximately 3 cm from the ureterovesical junction.  I passed an open-ended ureteral catheter just over it and did a gentle retrograde demonstrating a little bit of tortuosity at this level but no stone and it was a little bit different and J hooking seen in the mail.  The dye did nicely go upwards with no extravasation and noting mild dilation of some of the renal calyces.  The stone at the beginning of the case was noted at the UP junction  I then passed a sensor wire all the way to the upper pole calyx.  I did not do any more retrograde.  Under fluoroscopic and cystoscopic guidance I passed a double-J stent without a string curling in the upper pole calyx and curling the bladder.  X-rays were taken.  There was a mild volcano effect with ureteral pain each  Bladder was emptied and patient was taken to recovery room without a Foley catheter  Please with surgery.  Hopefully it reaches her goal

## 2019-05-14 NOTE — Plan of Care (Signed)
POC initiated 

## 2019-05-15 ENCOUNTER — Encounter (HOSPITAL_COMMUNITY): Payer: Self-pay | Admitting: Urology

## 2019-05-15 DIAGNOSIS — N201 Calculus of ureter: Secondary | ICD-10-CM | POA: Diagnosis not present

## 2019-05-15 MED ORDER — PRAVASTATIN SODIUM 40 MG PO TABS: 40.0000 mg | ORAL_TABLET | Freq: Every day | ORAL | 0 refills | Status: DC

## 2019-05-15 MED ORDER — CIPROFLOXACIN HCL 250 MG PO TABS: 250.0000 mg | ORAL_TABLET | Freq: Two times a day (BID) | ORAL | 0 refills | Status: DC

## 2019-05-15 NOTE — Discharge Instructions (Signed)
I have reviewed discharge instructions in detail with the patient. They will follow-up with me or their physician as scheduled. My nurse will also be calling the patients as per protocol.   

## 2019-05-15 NOTE — Progress Notes (Signed)
Patient remains A&Ox4 ambulatory without assistance. Discharge instructions reviewed, Pt reports pharmacy has been called to wrong pharmacy. New pharmacy information: [place], [place] [address]. Provider paged with update.

## 2019-05-15 NOTE — Discharge Summary (Signed)
Date of admission: 05/14/2019  Date of discharge: 05/15/2019  Admission diagnosis: ureteral stone   Discharge diagnosis: ureteral stone and UTI  Secondary diagnoses: early UTI  History and Physical: For full details, please see admission history and physical. Briefly, Brenda Delgado is a 73 y.o. year old patient with above diagnosis.   Hospital Course: left retrograde and cysto and stent; good post op coures  Laboratory values:  Recent Labs    05/12/19 2118  HGB 13.8  HCT 43.4   Recent Labs    05/12/19 2118  CREATININE 0.83    Disposition: Home  Discharge instruction: The patient was instructed to be ambulatory but told to refrain from heavy lifting, strenuous activity, or driving. detailed  Discharge medications:  Allergies as of 05/15/2019      Reactions   Pravastatin    Other reaction(s): Myalgias (intolerance) 2016   Erythromycin Nausea And Vomiting   Cramps   Iohexol     Code: HIVES, Desc: Pt's husband called when they got home from pt's CEPI #1.  Pt w/ hives/itching.  Instructed on taking Benadryl now, 1hr and q6hrs prn.  Instructed to add IV Contrast to allergy list, esp for CT scans, and to get pre-med info in the future.  jkl, Onset Date: [id number]   Amoxicillin-pot Clavulanate Other (See Comments)   GI Upset GI Upset   Codeine Nausea And Vomiting   Moxifloxacin Itching   Sulfamethoxazole Itching, Rash      Medication List    TAKE these medications   aspirin EC 81 MG tablet Take 81 mg by mouth daily.   Calcium-Vitamin D-Vitamin K [id number] MG-UNT-MCG Chew Chew 2 tablets by mouth daily.   ciprofloxacin 250 MG tablet Commonly known as: Cipro Take 1 tablet (250 mg total) by mouth 2 (two) times daily.   ondansetron 4 MG tablet Commonly known as: ZOFRAN Take 1 tablet (4 mg total) by mouth every 6 (six) hours for 20 doses.   oxyCODONE 5 MG immediate release tablet Commonly known as: Roxicodone Take 1 tablet (5 mg total) by mouth every 6 (six) hours  as needed for up to 15 doses for severe pain.   pantoprazole 40 MG tablet Commonly known as: PROTONIX Take 40 mg by mouth daily.   PARoxetine 30 MG tablet Commonly known as: PAXIL Take 15 mg by mouth every morning.   pravastatin 40 MG tablet Commonly known as: PRAVACHOL Take 1 tablet (40 mg total) by mouth daily.   tamsulosin 0.4 MG Caps capsule Commonly known as: Flomax Take 1 capsule (0.4 mg total) by mouth daily after supper for 5 days.   thiamine 100 MG tablet Commonly known as: VITAMIN B-1 Take 100 mg by mouth daily.       Followup:  Follow-up Information    Alexis Frock, MD Follow up.   Specialty: Urology Why: as scheduled Contact information: [address] [place] [address] [phone]

## 2019-05-15 NOTE — Progress Notes (Signed)
Patient reports pravastatin had been discontinued. Pt will follow up with provider.

## 2019-05-16 ENCOUNTER — Other Ambulatory Visit: Payer: Self-pay | Admitting: Urology

## 2019-05-16 NOTE — Anesthesia Postprocedure Evaluation (Signed)
Anesthesia Post Note  Patient: Brenda Delgado  Procedure(s) Performed: CYSTOSCOPY WITH STENT PLACEMENT, RETROGRADE (Left Ureter)     Patient location during evaluation: PACU Anesthesia Type: General Level of consciousness: awake and alert Pain management: pain level controlled Vital Signs Assessment: post-procedure vital signs reviewed and stable Respiratory status: spontaneous breathing, nonlabored ventilation, respiratory function stable and patient connected to nasal cannula oxygen Cardiovascular status: blood pressure returned to baseline and stable Postop Assessment: no apparent nausea or vomiting Anesthetic complications: no    Last Vitals:  Vitals:   05/15/19 0502 05/15/19 0810  BP: 138/76 139/65  Pulse: 94 90  Resp: 18 18  Temp: 36.7 C 36.9 C  SpO2: 97% 96%    Last Pain:  Vitals:   05/15/19 0816  TempSrc:   PainSc: 0-No pain                 Ahmeer Tuman S

## 2019-05-19 DIAGNOSIS — J3089 Other allergic rhinitis: Secondary | ICD-10-CM | POA: Diagnosis not present

## 2019-05-19 DIAGNOSIS — J301 Allergic rhinitis due to pollen: Secondary | ICD-10-CM | POA: Diagnosis not present

## 2019-05-19 DIAGNOSIS — J3081 Allergic rhinitis due to animal (cat) (dog) hair and dander: Secondary | ICD-10-CM | POA: Diagnosis not present

## 2019-05-24 ENCOUNTER — Other Ambulatory Visit (HOSPITAL_COMMUNITY)
Admission: RE | Admit: 2019-05-24 | Discharge: 2019-05-24 | Disposition: A | Discharge status: Home / Self Care | Payer: Medicare Other | Source: Ambulatory Visit | Attending: Urology | Admitting: Urology

## 2019-05-24 DIAGNOSIS — Z01812 Encounter for preprocedural laboratory examination: Secondary | ICD-10-CM | POA: Diagnosis not present

## 2019-05-24 DIAGNOSIS — Z20828 Contact with and (suspected) exposure to other viral communicable diseases: Secondary | ICD-10-CM | POA: Insufficient documentation

## 2019-05-25 LAB — NOVEL CORONAVIRUS, NAA (HOSP ORDER, SEND-OUT TO REF LAB; TAT 18-24 HRS): SARS-CoV-2, NAA: NOT DETECTED

## 2019-05-27 ENCOUNTER — Other Ambulatory Visit: Payer: Self-pay

## 2019-05-27 ENCOUNTER — Encounter (HOSPITAL_BASED_OUTPATIENT_CLINIC_OR_DEPARTMENT_OTHER): Payer: Self-pay | Admitting: *Deleted

## 2019-05-27 MED ORDER — GENTAMICIN SULFATE 40 MG/ML IJ SOLN
5.0000 mg/kg | INTRAVENOUS | Status: DC
  Filled 2019-05-27 (×2): qty 7

## 2019-05-27 NOTE — Progress Notes (Signed)
Spoke w/ via phone for pre-op interview--- PT Lab needs dos----  Istat 8 and EKG COVID test ------ 05-24-2019 Arrive at ------- 1315 NPO after ------ MN w exception clear liquids until 0900 (no cream/ milk products) then nothing by mouth Medications to take morning of surgery ----- Paxil, Colace Diabetic medication ----- n/a Patient Special Instructions ----- bring rescue inhaler with you dos Pre-Op special Istructions ----- no Patient verbalized understanding of instructions that were given at this phone interview. Patient denies shortness of breath, chest pain, fever, cough a this phone interview.

## 2019-05-28 ENCOUNTER — Other Ambulatory Visit: Payer: Self-pay

## 2019-05-28 ENCOUNTER — Encounter (HOSPITAL_BASED_OUTPATIENT_CLINIC_OR_DEPARTMENT_OTHER)
Admission: RE | Disposition: A | Discharge status: Home / Self Care | Payer: Self-pay | Source: Home / Self Care | Attending: Urology

## 2019-05-28 ENCOUNTER — Encounter (HOSPITAL_BASED_OUTPATIENT_CLINIC_OR_DEPARTMENT_OTHER): Payer: Self-pay | Admitting: Anesthesiology

## 2019-05-28 ENCOUNTER — Ambulatory Visit (HOSPITAL_BASED_OUTPATIENT_CLINIC_OR_DEPARTMENT_OTHER): Payer: Medicare Other | Admitting: Anesthesiology

## 2019-05-28 ENCOUNTER — Ambulatory Visit (HOSPITAL_BASED_OUTPATIENT_CLINIC_OR_DEPARTMENT_OTHER)
Admission: RE | Admit: 2019-05-28 | Discharge: 2019-05-28 | Disposition: A | Discharge status: Home / Self Care | Payer: Medicare Other | Attending: Urology | Admitting: Urology

## 2019-05-28 DIAGNOSIS — Z881 Allergy status to other antibiotic agents status: Secondary | ICD-10-CM | POA: Insufficient documentation

## 2019-05-28 DIAGNOSIS — N202 Calculus of kidney with calculus of ureter: Secondary | ICD-10-CM | POA: Insufficient documentation

## 2019-05-28 DIAGNOSIS — Z880 Allergy status to penicillin: Secondary | ICD-10-CM | POA: Diagnosis not present

## 2019-05-28 DIAGNOSIS — J45909 Unspecified asthma, uncomplicated: Secondary | ICD-10-CM | POA: Diagnosis not present

## 2019-05-28 DIAGNOSIS — Z885 Allergy status to narcotic agent status: Secondary | ICD-10-CM | POA: Insufficient documentation

## 2019-05-28 DIAGNOSIS — R7303 Prediabetes: Secondary | ICD-10-CM | POA: Insufficient documentation

## 2019-05-28 DIAGNOSIS — Z882 Allergy status to sulfonamides status: Secondary | ICD-10-CM | POA: Insufficient documentation

## 2019-05-28 DIAGNOSIS — K219 Gastro-esophageal reflux disease without esophagitis: Secondary | ICD-10-CM | POA: Diagnosis not present

## 2019-05-28 DIAGNOSIS — K449 Diaphragmatic hernia without obstruction or gangrene: Secondary | ICD-10-CM | POA: Diagnosis not present

## 2019-05-28 HISTORY — DX: Calculus of kidney: N20.0

## 2019-05-28 HISTORY — DX: Nocturia: R35.1

## 2019-05-28 HISTORY — DX: Other allergic rhinitis: J30.89

## 2019-05-28 HISTORY — DX: Diaphragmatic hernia without obstruction or gangrene: K44.9

## 2019-05-28 HISTORY — PX: HOLMIUM LASER APPLICATION: SHX5852

## 2019-05-28 HISTORY — DX: Constipation, unspecified: K59.00

## 2019-05-28 HISTORY — DX: Presence of spectacles and contact lenses: Z97.3

## 2019-05-28 HISTORY — DX: Other complications of anesthesia, initial encounter: T88.59XA

## 2019-05-28 HISTORY — DX: Prediabetes: R73.03

## 2019-05-28 HISTORY — DX: Calculus of ureter: N20.1

## 2019-05-28 HISTORY — DX: Unspecified osteoarthritis, unspecified site: M19.90

## 2019-05-28 HISTORY — DX: Unspecified asthma, uncomplicated: J45.909

## 2019-05-28 HISTORY — PX: CYSTOSCOPY WITH RETROGRADE PYELOGRAM, URETEROSCOPY AND STENT PLACEMENT: SHX5789

## 2019-05-28 HISTORY — DX: Presence of external hearing-aid: Z97.4

## 2019-05-28 LAB — POCT I-STAT, CHEM 8
BUN: 18 mg/dL (ref 8–23)
Calcium, Ion: 1.25 mmol/L (ref 1.15–1.40)
Chloride: 101 mmol/L (ref 98–111)
Creatinine, Ser: 0.6 mg/dL (ref 0.44–1.00)
Glucose, Bld: 80 mg/dL (ref 70–99)
HCT: 44 % (ref 36.0–46.0)
Hemoglobin: 15 g/dL (ref 12.0–15.0)
Potassium: 4 mmol/L (ref 3.5–5.1)
Sodium: 140 mmol/L (ref 135–145)
TCO2: 27 mmol/L (ref 22–32)

## 2019-05-28 SURGERY — CYSTOURETEROSCOPY, WITH RETROGRADE PYELOGRAM AND STENT INSERTION
Anesthesia: General | Site: Renal | Laterality: Left

## 2019-05-28 MED ORDER — ONDANSETRON HCL 4 MG/2ML IJ SOLN
INTRAMUSCULAR | Status: DC | PRN
  Administered 2019-05-28: 4 mg via INTRAVENOUS

## 2019-05-28 MED ORDER — PROPOFOL 10 MG/ML IV BOLUS
INTRAVENOUS | Status: AC
  Filled 2019-05-28: qty 20

## 2019-05-28 MED ORDER — CEPHALEXIN 500 MG PO CAPS: 500.0000 mg | ORAL_CAPSULE | Freq: Two times a day (BID) | ORAL | 0 refills | Status: DC

## 2019-05-28 MED ORDER — LIDOCAINE HCL (CARDIAC) PF 100 MG/5ML IV SOSY
PREFILLED_SYRINGE | INTRAVENOUS | Status: DC | PRN
  Administered 2019-05-28: 60 mg via INTRAVENOUS

## 2019-05-28 MED ORDER — SODIUM CHLORIDE 0.9 % IR SOLN
Status: DC | PRN
  Administered 2019-05-28: 3000 mL

## 2019-05-28 MED ORDER — LACTATED RINGERS IV SOLN
INTRAVENOUS | Status: DC
  Administered 2019-05-28: 14:00:00 via INTRAVENOUS
  Filled 2019-05-28: qty 1000

## 2019-05-28 MED ORDER — DEXAMETHASONE SODIUM PHOSPHATE 10 MG/ML IJ SOLN
INTRAMUSCULAR | Status: AC
  Filled 2019-05-28: qty 1

## 2019-05-28 MED ORDER — PROPOFOL 10 MG/ML IV BOLUS
INTRAVENOUS | Status: DC | PRN
  Administered 2019-05-28: 50 mg via INTRAVENOUS
  Administered 2019-05-28: 150 mg via INTRAVENOUS
  Administered 2019-05-28: 30 mg via INTRAVENOUS

## 2019-05-28 MED ORDER — OXYCODONE HCL 5 MG PO TABS
5.0000 mg | ORAL_TABLET | Freq: Once | ORAL | Status: DC | PRN
  Filled 2019-05-28: qty 1

## 2019-05-28 MED ORDER — ONDANSETRON HCL 4 MG/2ML IJ SOLN
4.0000 mg | Freq: Once | INTRAMUSCULAR | Status: DC | PRN
  Filled 2019-05-28: qty 2

## 2019-05-28 MED ORDER — LIDOCAINE 2% (20 MG/ML) 5 ML SYRINGE
INTRAMUSCULAR | Status: AC
  Filled 2019-05-28: qty 5

## 2019-05-28 MED ORDER — DEXAMETHASONE SODIUM PHOSPHATE 4 MG/ML IJ SOLN
INTRAMUSCULAR | Status: DC | PRN
  Administered 2019-05-28: 5 mg via INTRAVENOUS

## 2019-05-28 MED ORDER — KETOROLAC TROMETHAMINE 10 MG PO TABS: 10.0000 mg | ORAL_TABLET | Freq: Three times a day (TID) | ORAL | 0 refills | Status: DC | PRN

## 2019-05-28 MED ORDER — FENTANYL CITRATE (PF) 100 MCG/2ML IJ SOLN
INTRAMUSCULAR | Status: AC
  Filled 2019-05-28: qty 2

## 2019-05-28 MED ORDER — GENTAMICIN SULFATE 40 MG/ML IJ SOLN
5.0000 mg/kg | INTRAVENOUS | Status: AC
  Administered 2019-05-28: 260 mg via INTRAVENOUS
  Filled 2019-05-28 (×2): qty 6.5

## 2019-05-28 MED ORDER — ONDANSETRON HCL 4 MG/2ML IJ SOLN
INTRAMUSCULAR | Status: AC
  Filled 2019-05-28: qty 2

## 2019-05-28 MED ORDER — OXYCODONE HCL 5 MG PO TABS: 5.0000 mg | ORAL_TABLET | Freq: Four times a day (QID) | ORAL | 0 refills | Status: DC | PRN

## 2019-05-28 MED ORDER — OXYCODONE HCL 5 MG/5ML PO SOLN
5.0000 mg | Freq: Once | ORAL | Status: DC | PRN
  Filled 2019-05-28: qty 5

## 2019-05-28 MED ORDER — FENTANYL CITRATE (PF) 100 MCG/2ML IJ SOLN
25.0000 ug | INTRAMUSCULAR | Status: DC | PRN
  Filled 2019-05-28: qty 1

## 2019-05-28 MED ORDER — FENTANYL CITRATE (PF) 100 MCG/2ML IJ SOLN
INTRAMUSCULAR | Status: DC | PRN
  Administered 2019-05-28 (×8): 25 ug via INTRAVENOUS

## 2019-05-28 MED ORDER — IOHEXOL 300 MG/ML  SOLN
INTRAMUSCULAR | Status: DC | PRN
  Administered 2019-05-28: 16:00:00 20 mL

## 2019-05-28 SURGICAL SUPPLY — 26 items
BAG DRAIN URO-CYSTO SKYTR STRL (DRAIN) ×2 IMPLANT
BAG DRN UROCATH (DRAIN) ×1
BASKET LASER NITINOL 1.9FR (BASKET) ×1 IMPLANT
BASKET STONE NCOMPASS (UROLOGICAL SUPPLIES) ×1 IMPLANT
BSKT STON RTRVL 120 1.9FR (BASKET) ×1
CATH INTERMIT  6FR 70CM (CATHETERS) ×1 IMPLANT
CLOTH BEACON ORANGE TIMEOUT ST (SAFETY) ×2 IMPLANT
FIBER LASER FLEXIVA 365 (UROLOGICAL SUPPLIES) IMPLANT
FIBER LASER TRAC TIP (UROLOGICAL SUPPLIES) ×1 IMPLANT
GLOVE BIO SURGEON STRL SZ7.5 (GLOVE) ×2 IMPLANT
GOWN STRL REUS W/TWL LRG LVL3 (GOWN DISPOSABLE) ×2 IMPLANT
GUIDEWIRE ANG ZIPWIRE 038X150 (WIRE) ×2 IMPLANT
GUIDEWIRE STR DUAL SENSOR (WIRE) ×2 IMPLANT
IV NS 1000ML (IV SOLUTION) ×2
IV NS 1000ML BAXH (IV SOLUTION) ×1 IMPLANT
IV NS IRRIG 3000ML ARTHROMATIC (IV SOLUTION) ×2 IMPLANT
KIT TURNOVER CYSTO (KITS) ×2 IMPLANT
MANIFOLD NEPTUNE II (INSTRUMENTS) ×2 IMPLANT
NS IRRIG 500ML POUR BTL (IV SOLUTION) ×3 IMPLANT
PACK CYSTO (CUSTOM PROCEDURE TRAY) ×2 IMPLANT
SHEATH URETERAL 12FRX28CM (UROLOGICAL SUPPLIES) ×1 IMPLANT
STENT POLARIS 5FRX22 (STENTS) ×1 IMPLANT
SYR 10ML LL (SYRINGE) ×2 IMPLANT
TUBE CONNECTING 12X1/4 (SUCTIONS) IMPLANT
TUBE FEEDING 8FR 16IN STR KANG (MISCELLANEOUS) ×1 IMPLANT
TUBING UROLOGY SET (TUBING) ×1 IMPLANT

## 2019-05-28 NOTE — Discharge Instructions (Signed)
1 - You may have urinary urgency (bladder spasms) and bloody urine on / off with stent in place. This is normal. ? ?2 - Call MD or go to ER for fever >102, severe pain / nausea / vomiting not relieved by medications, or acute change in medical status ? ? ?[facility] ?[phone] ?Post Ureteroscopy With or Without Stent Instructions ? ?Definitions: ? ?Ureter: The duct that transports urine from the kidney to the bladder. ?Stent:   A plastic hollow tube that is placed into the ureter, from the kidney to the bladder to prevent the ureter from swelling shut. ? ?GENERAL INSTRUCTIONS: ? ?Despite the fact that no skin incisions were used, the area around the ureter and bladder is raw and irritated. The stent is a foreign body which will further irritate the bladder wall. This irritation is manifested by increased frequency of urination, both day and night, and by an increase in the urge to urinate. In some, the urge to urinate is present almost always. Sometimes the urge is strong enough that you may not be able to stop yourself from urinating. The only real cure is to remove the stent and then give time for the bladder wall to heal which can't be done until the danger of the ureter swelling shut has passed, which varies. ? ?You may see some blood in your urine while the stent is in place and a few days afterwards. Do not be alarmed, even if the urine was clear for a while. Get off your feet and drink lots of fluids until clearing occurs. If you start to pass clots or don't improve, call us. ? ?DIET: ?You may return to your normal diet immediately. Because of the raw surface of your bladder, alcohol, spicy foods, acid type foods and drinks with caffeine may cause irritation or frequency and should be used in moderation. To keep your urine flowing freely and to avoid constipation, drink plenty of fluids during the day ( 8-10 glasses ). ?Tip: Avoid cranberry juice because it is very  acidic. ? ?ACTIVITY: ?Your physical activity doesn't need to be restricted. However, if you are very active, you may see some blood in your urine. We suggest that you reduce your activity under these circumstances until the bleeding has stopped. ? ?BOWELS: ?It is important to keep your bowels regular during the postoperative period. Straining with bowel movements can cause bleeding. A bowel movement every other day is reasonable. Use a mild laxative if needed, such as Milk of Magnesia 2-3 tablespoons, or 2 Dulcolax tablets. Call if you continue to have problems. If you have been taking narcotics for pain, before, during or after your surgery, you may be constipated. Take a laxative if necessary. ? ? ?MEDICATION: ?You should resume your pre-surgery medications unless told not to. In addition you will often be given an antibiotic to prevent infection. These should be taken as prescribed until the bottles are finished unless you are having an unusual reaction to one of the drugs. ? ?PROBLEMS YOU SHOULD REPORT TO US: ?Fevers over 100.5 Fahrenheit. ?Heavy bleeding, or clots ( See above notes about blood in urine ). ?Inability to urinate. ?Drug reactions ( hives, rash, nausea, vomiting, diarrhea ). ?Severe burning or pain with urination that is not improving. ? ?FOLLOW-UP: ?You will need a follow-up appointment to monitor your progress. Call for this appointment at the number listed above. Usually the first appointment will be about three to fourteen days after your surgery. ? ? ? ?  ?  Post Anesthesia Home Care Instructions ? ?Activity: ?Get plenty of rest for the remainder of the day. A responsible individual must stay with you for 24 hours following the procedure.  ?For the next 24 hours, DO NOT: ?-Drive a car ?-Operate machinery ?-Drink alcoholic beverages ?-Take any medication unless instructed by your physician ?-Make any legal decisions or sign important papers. ? ?Meals: ?Start with liquid foods such as gelatin or  soup. Progress to regular foods as tolerated. Avoid greasy, spicy, heavy foods. If nausea and/or vomiting occur, drink only clear liquids until the nausea and/or vomiting subsides. Call your physician if vomiting continues. ? ?Special Instructions/Symptoms: ?Your throat may feel dry or sore from the anesthesia or the breathing tube placed in your throat during surgery. If this causes discomfort, gargle with warm salt water. The discomfort should disappear within 24 hours. ? ? ?    ?

## 2019-05-28 NOTE — Anesthesia Postprocedure Evaluation (Signed)
Anesthesia Post Note  Patient: Brenda Delgado  Procedure(s) Performed: CYSTOSCOPY WITH RETROGRADE PYELOGRAM, URETEROSCOPY AND STENT REPLACEMENT (Left Renal) HOLMIUM LASER APPLICATION (Left Renal)     Patient location during evaluation: PACU Anesthesia Type: General Level of consciousness: awake and alert Pain management: pain level controlled Vital Signs Assessment: post-procedure vital signs reviewed and stable Respiratory status: spontaneous breathing, nonlabored ventilation, respiratory function stable and patient connected to nasal cannula oxygen Cardiovascular status: blood pressure returned to baseline and stable Postop Assessment: no apparent nausea or vomiting Anesthetic complications: no    Last Vitals:  Vitals:   05/28/19 1700 05/28/19 1715  BP: (!) 151/99   Pulse: 85 85  Resp: 13 16  Temp:    SpO2: 98% 93%    Last Pain:  Vitals:   05/28/19 1645  TempSrc:   PainSc: 0-No pain                 Melany Wiesman DAVID

## 2019-05-28 NOTE — Anesthesia Preprocedure Evaluation (Addendum)
Anesthesia Evaluation  Patient identified by MRN, date of birth, ID band Patient awake    Reviewed: Allergy & Precautions, NPO status , Patient's Chart, lab work & pertinent test results  History of Anesthesia Complications (+) PONV and history of anesthetic complications  Airway Mallampati: II  TM Distance: >3 FB Neck ROM: Full    Dental  (+) Dental Advisory Given   Pulmonary asthma ,    Pulmonary exam normal        Cardiovascular negative cardio ROS Normal cardiovascular exam     Neuro/Psych negative neurological ROS  negative psych ROS   GI/Hepatic Neg liver ROS, hiatal hernia, GERD  Medicated and Controlled,  Endo/Other   Pre-DM   Renal/GU negative Renal ROS     Musculoskeletal  (+) Arthritis ,   Abdominal   Peds  Hematology negative hematology ROS (+)   Anesthesia Other Findings   Reproductive/Obstetrics                            Anesthesia Physical Anesthesia Plan  ASA: II  Anesthesia Plan: General   Post-op Pain Management:    Induction: Intravenous  PONV Risk Score and Plan: 4 or greater and Treatment may vary due to age or medical condition, Ondansetron, Dexamethasone and Propofol infusion  Airway Management Planned: LMA  Additional Equipment: None  Intra-op Plan:   Post-operative Plan: Extubation in OR  Informed Consent: I have reviewed the patients History and Physical, chart, labs and discussed the procedure including the risks, benefits and alternatives for the proposed anesthesia with the patient or authorized representative who has indicated his/her understanding and acceptance.     Dental advisory given  Plan Discussed with: CRNA and Anesthesiologist  Anesthesia Plan Comments:        Anesthesia Quick Evaluation

## 2019-05-28 NOTE — H&P (Signed)
Brenda Delgado is an 73 y.o. female.    Chief Complaint: Pre-Op  LEFT Ureteroscopic Stone Manipulation / Stent Exchange  HPI:   1 - Left Ureteral / Bilateral Renal Stones - 57mm left UPJ stone by ER CT 05/2019 on eval flank pain. Stone is 62mm just below L2 TP on scout images. Also scattered ipsilaeral renal stoens (vol abtou 1.5cm total) and punctate Rt renal. Cr 0.83, UA without infectious parameters at that time. Had JJ stent placed 10/7 by MacDiarmid in setting of worsening colic and some low grade temps.  2 - Bacteruria - low grade fever and bacteruria (strep v.) prompting urgent stent 10/7. No fevers since   PMH sig for benign hyst, lap chole, neck surgery (no deficits). NO ischemic CV disease / blood thinners. Between PCP's at present. She retiered Automotive engineer.   Today "Brenda Delgado" is seen to proceed with LEFT ureteroscopic stone manipulation with goal of left side stone free. No interval fevers. C19 screen negative.     Past Medical History:  Diagnosis Date  . Arthritis    right knee  . Complication of anesthesia    per pt with surgery 05-14-2019 right facial swelling and lip/ mouth blister's  . Constipation   . Environmental and seasonal allergies   . Hiatal hernia   . Left ureteral stone   . Mild asthma   . Nephrolithiasis   . Nocturia   . PONV (postoperative nausea and vomiting)   . Pre-diabetes   . Renal calculus, left   . Wears glasses   . Wears hearing aid in both ears     Past Surgical History:  Procedure Laterality Date  . ANTERIOR CERVICAL DECOMP/DISCECTOMY FUSION  01-06-2010   @[facility]    C4--5  . CARPAL TUNNEL RELEASE Right 2000  . CYSTOCELE REPAIR  09/11/2011   Procedure: ANTERIOR REPAIR (CYSTOCELE);  Surgeon: Olga Millers, MD;  Location: [facility];  Service: Gynecology;  Laterality: N/A;  . CYSTOSCOPY  09/11/2011   Procedure: CYSTOSCOPY;  Surgeon: Olga Millers, MD;  Location: [facility];  Service: Gynecology;  Laterality: N/A;  . CYSTOSCOPY WITH STENT  PLACEMENT Left 05/14/2019   Procedure: CYSTOSCOPY WITH STENT PLACEMENT, RETROGRADE;  Surgeon: Bjorn Loser, MD;  Location: [facility];  Service: Urology;  Laterality: Left;  . EXCISION VULVA CYST Bilateral 01-16-2005  @[facility]   . KNEE ARTHROSCOPY Right 2005;    02-11-2013  . KNEE SURGERY Right 08-20-2012   @[facility]    peroneal nerve neurolysis proximal tibia/ fibular articular branch   . LAPAROSCOPIC CHOLECYSTECTOMY  1990s  . SACROSPINOUS LIGAMENT FIXATION  04-05-2017   @[facility]    UPHOLD vaginal mesh and  Anterior Repair  . SALPINGOOPHORECTOMY  09/11/2011   Procedure: SALPINGO OOPHERECTOMY;  Surgeon: Olga Millers, MD;  Location: [facility];  Service: Gynecology;  Laterality: Bilateral;  . VAGINAL HYSTERECTOMY  09/11/2011   Procedure: HYSTERECTOMY VAGINAL;  Surgeon: Olga Millers, MD;  Location: [facility];  Service: Gynecology;  Laterality: N/A;  . [place] Dana Milagros Loll BIOPSY  09/11/2011   Procedure: VULVAR BIOPSY;  Surgeon: Olga Millers, MD;  Location: [facility];  Service: Gynecology;  Laterality: Left;  sebaceous cyst excision left vula    History reviewed. No pertinent family history. Social History:  reports that she has never smoked. She has never used smokeless tobacco. She reports current alcohol use. She reports that she does not use drugs.  Allergies:  Allergies  Allergen Reactions  . Pravastatin     Other reaction(s): Myalgias (intolerance) 2016  .  Erythromycin Nausea And Vomiting    Cramps   . Iohexol Hives     Code: HIVES, Desc: Pt's husband called when they got home from pt's CEPI #1.  Pt w/ hives/itching.  Instructed on taking Benadryl now, 1hr and q6hrs prn.  Instructed to add IV Contrast to allergy list, esp for CT scans, and to get pre-med info in the future.  jkl, Onset Date: [id number]   . Amoxicillin-Pot Clavulanate Other (See Comments)    GI Upset    . Codeine Nausea And Vomiting  . Moxifloxacin Itching  . Sulfamethoxazole Itching and Rash    No medications prior to admission.     No results found for this or any previous visit (from the past 48 hour(s)). No results found.  Review of Systems  Constitutional: Negative for chills and fever.  Genitourinary: Positive for urgency.  All other systems reviewed and are negative.   Height 4\' 11"  (1.499 m), weight 52.6 kg. Physical Exam  Constitutional: She appears well-developed.  HENT:  Head: Normocephalic.  Eyes: Pupils are equal, round, and reactive to light.  Neck: Normal range of motion.  Cardiovascular: Normal rate.  Respiratory: Effort normal.  GI: Soft.  Genitourinary:    Genitourinary Comments: No CVAT at present   Neurological: She is alert.  Skin: Skin is warm.  Psychiatric: She has a normal mood and affect.     Assessment/Plan  Proceed as planned with LEFT ureteroscopic stone manipulation / stent exchange. Risks, benefits, alternatives, expected peri-op course discussed previously and reiterated today.   Alexis Frock, MD 05/28/2019, 8:41 AM

## 2019-05-28 NOTE — Anesthesia Procedure Notes (Signed)
Procedure Name: LMA Insertion Date/Time: 05/28/2019 3:35 PM Performed by: Justice Rocher, CRNA Pre-anesthesia Checklist: Patient identified, Emergency Drugs available, Suction available and Patient being monitored Patient Re-evaluated:Patient Re-evaluated prior to induction Oxygen Delivery Method: Circle system utilized Preoxygenation: Pre-oxygenation with 100% oxygen Induction Type: IV induction Ventilation: Mask ventilation without difficulty LMA: LMA inserted LMA Size: 3.0 Number of attempts: 1 Airway Equipment and Method: Bite block Placement Confirmation: positive ETCO2 and breath sounds checked- equal and bilateral Tube secured with: Tape Dental Injury: Teeth and Oropharynx as per pre-operative assessment

## 2019-05-28 NOTE — Transfer of Care (Signed)
Immediate Anesthesia Transfer of Care Note  Patient: Brenda Delgado  Procedure(s) Performed: Procedure(s) (LRB): CYSTOSCOPY WITH RETROGRADE PYELOGRAM, URETEROSCOPY AND STENT REPLACEMENT (Left) HOLMIUM LASER APPLICATION (Left)  Patient Location: PACU  Anesthesia Type: General  Level of Consciousness: awake, sedated, patient cooperative and responds to stimulation  Airway & Oxygen Therapy: Patient Spontanous Breathing and Patient connected to [place] 02 and soft FM   Post-op Assessment: Report given to PACU RN, Post -op Vital signs reviewed and stable and Patient moving all extremities  Post vital signs: Reviewed and stable  Complications: No apparent anesthesia complications

## 2019-05-28 NOTE — Brief Op Note (Signed)
05/28/2019  4:36 PM  PATIENT:  Brenda Delgado  73 y.o. female  PRE-OPERATIVE DIAGNOSIS:  LEFT URETERAL AND RENAL STONES  POST-OPERATIVE DIAGNOSIS:  LEFT URETERAL AND RENAL STONES  PROCEDURE:  Procedure(s): CYSTOSCOPY WITH RETROGRADE PYELOGRAM, URETEROSCOPY AND STENT REPLACEMENT (Left) HOLMIUM LASER APPLICATION (Left)  SURGEON:  Surgeon(s) and Role:    Alexis Frock, MD - Primary  PHYSICIAN ASSISTANT:   ASSISTANTS: none   ANESTHESIA:   general  EBL:  0 mL   BLOOD ADMINISTERED:none  DRAINS: none   LOCAL MEDICATIONS USED:  NONE  SPECIMEN:  Source of Specimen:  left renal / ureteral stones  DISPOSITION OF SPECIMEN:  [facility] for compositional analysis  COUNTS:  YES  TOURNIQUET:  * No tourniquets in log *  DICTATION: .Other Dictation: Dictation Number [id number]  PLAN OF CARE: Discharge to home after PACU  PATIENT DISPOSITION:  PACU - hemodynamically stable.   Delay start of Pharmacological VTE agent (>24hrs) due to surgical blood loss or risk of bleeding: yes

## 2019-05-29 ENCOUNTER — Encounter (HOSPITAL_BASED_OUTPATIENT_CLINIC_OR_DEPARTMENT_OTHER): Payer: Self-pay | Admitting: Urology

## 2019-05-29 NOTE — Op Note (Signed)
NAME: Brenda Delgado, Brenda Delgado MEDICAL RECORD [id number] ACCOUNT [id number] DATE OF BIRTH:Nov 15, 1945 FACILITY: [facility] LOCATION: [facility] PHYSICIAN:Vlad Mayberry, MD  OPERATIVE REPORT  DATE OF PROCEDURE:  05/28/2019  PREOPERATIVE DIAGNOSIS:  Left ureteral and renal stones.  PROCEDURE: 1.  Cystoscopy, left retrograde pyelogram, interpretation. 2.  Exchange of left ureteral stent 5 x 22 Polaris with no tether. 3.  Left ureteroscopy, laser lithotripsy.  ESTIMATED BLOOD LOSS:  Nil.  COMPLICATIONS:  None.  SPECIMENS:  Left ureteral and renal stone fragments for composition analysis.  FINDINGS: 1.  Multifocal left renal stone, likely retrograde displacement of prior ureteral stone. 2.  Complete resolution of all accessible stone fragments larger than one-third mm. 3.  Likely persistence of extreme lower pole stone.  This was visualized on fluoroscopy, but could not be visualized ureteroscopically due to extremely acute angulation.  INDICATION:  The patient is a very pleasant 73 year old lady with history of prior urolithiasis, found on workup of colicky flank pain to have a left proximal ureteral stone.  She was initially set up for ureteroscopy in the outpatient setting; however,  she did develop some mild infectious parameters and refractory colic.  Therefore, she underwent stenting several weeks ago as a temporizing measure.  She now presents for definitive stone management on the left side.  She has been fever free for well  over a week.  Informed consent was obtained and placed in the medical record.  PROCEDURE IN DETAIL:  The patient being identified, the procedure being left ureteroscopic stimulation was confirmed.  Procedure timeout was performed.  IV antibiotics were administered.  General LMA anesthesia was induced.  The patient was placed into a  low lithotomy position.  A sterile field was created, prepped and draped the vagina, introitus, and proximal thighs using iodine.   Cystourethroscopy was performed using a 21-French rigid cystoscope with offset lens.  Inspection of anterior and posterior  urethra was unremarkable.  Inspection of urinary bladder revealed distal end of the ureteral stent in situ on the left.  It was grasped and brought to the level of the urethral meatus with an 0.038 ZIPwire.  It was advanced to the lower pole and set  aside as a safety wire.  An 8-French feeding tube was placed in the urinary bladder for pressure release, and semirigid ureteroscopy was performed of the distal 4/5 left ureter alongside a separate sensor working wire.  No significant mucosal  abnormalities were found.  The semirigid scope was then exchanged for a 12/14 short length ureteral access sheath to the level of the proximal ureter using continuous fluoroscopic guidance, and flexible digital ureteroscopy was performed of the proximal  left ureter and systematic inspection of the left kidney and all calices x3.  There were multifocal intrarenal stones, mostly mid pole and lower pole.  Very few of these were amenable to simple basketing.  As such, holmium laser energy was applied to  these stones using 0.2 joules and 20 Hz, and stones were fragmented into multiple smaller pieces approximately 1-2 mm that were then amenable to basketing in an escape basket.  They were removed sequentially and set aside for composition analysis.   Several small fragments were inherently generated as well.  These were controlled using an NCompass type basket.  On retrograde pyelography, there was noted to be a calcification and then filling defect in the extreme lower pole that was unable to be  visualized despite multiple angulations of the ureteroscope.  This is a low risk location, and it was not  felt that any hazardous maneuvers would be warranted given this risk / benefit balance.  Given the large volume of stone removed, it was felt that  interval stenting with a nontethered stent would be  warranted.  As such, the access sheath was removed under continuous vision, and a new 5 x 22 Polaris-type stent was placed with the remaining safety wire using fluoroscopic guidance.  Good proximal and  distal planes were noted.  Bladder was emptied per cystoscope.  Procedure was then terminated.  The patient tolerated the procedure well.  No immediate perioperative complications.  The patient was taken to postanesthesia care in stable condition.  We  will plan for discharge home.  LN/NUANCE  D:05/28/2019 T:05/28/2019 JOB:[id number]

## 2019-06-02 DIAGNOSIS — J3081 Allergic rhinitis due to animal (cat) (dog) hair and dander: Secondary | ICD-10-CM | POA: Diagnosis not present

## 2019-06-02 DIAGNOSIS — J301 Allergic rhinitis due to pollen: Secondary | ICD-10-CM | POA: Diagnosis not present

## 2019-06-02 DIAGNOSIS — J3089 Other allergic rhinitis: Secondary | ICD-10-CM | POA: Diagnosis not present

## 2019-06-02 DIAGNOSIS — N202 Calculus of kidney with calculus of ureter: Secondary | ICD-10-CM | POA: Diagnosis not present

## 2019-06-10 DIAGNOSIS — N202 Calculus of kidney with calculus of ureter: Secondary | ICD-10-CM | POA: Diagnosis not present

## 2019-06-20 DIAGNOSIS — J3089 Other allergic rhinitis: Secondary | ICD-10-CM | POA: Diagnosis not present

## 2019-06-20 DIAGNOSIS — J301 Allergic rhinitis due to pollen: Secondary | ICD-10-CM | POA: Diagnosis not present

## 2019-06-20 DIAGNOSIS — J3081 Allergic rhinitis due to animal (cat) (dog) hair and dander: Secondary | ICD-10-CM | POA: Diagnosis not present

## 2019-06-30 DIAGNOSIS — J3081 Allergic rhinitis due to animal (cat) (dog) hair and dander: Secondary | ICD-10-CM | POA: Diagnosis not present

## 2019-06-30 DIAGNOSIS — J301 Allergic rhinitis due to pollen: Secondary | ICD-10-CM | POA: Diagnosis not present

## 2019-06-30 DIAGNOSIS — J3089 Other allergic rhinitis: Secondary | ICD-10-CM | POA: Diagnosis not present

## 2019-07-07 DIAGNOSIS — Z1231 Encounter for screening mammogram for malignant neoplasm of breast: Secondary | ICD-10-CM | POA: Diagnosis not present

## 2019-07-08 DIAGNOSIS — C801 Malignant (primary) neoplasm, unspecified: Secondary | ICD-10-CM

## 2019-07-08 HISTORY — DX: Malignant (primary) neoplasm, unspecified: C80.1

## 2019-07-11 DIAGNOSIS — R921 Mammographic calcification found on diagnostic imaging of breast: Secondary | ICD-10-CM | POA: Diagnosis not present

## 2019-07-11 DIAGNOSIS — N649 Disorder of breast, unspecified: Secondary | ICD-10-CM | POA: Diagnosis not present

## 2019-07-14 DIAGNOSIS — J3081 Allergic rhinitis due to animal (cat) (dog) hair and dander: Secondary | ICD-10-CM | POA: Diagnosis not present

## 2019-07-14 DIAGNOSIS — J301 Allergic rhinitis due to pollen: Secondary | ICD-10-CM | POA: Diagnosis not present

## 2019-07-14 DIAGNOSIS — J3089 Other allergic rhinitis: Secondary | ICD-10-CM | POA: Diagnosis not present

## 2019-07-21 ENCOUNTER — Other Ambulatory Visit: Payer: Self-pay

## 2019-07-22 ENCOUNTER — Ambulatory Visit (INDEPENDENT_AMBULATORY_CARE_PROVIDER_SITE_OTHER): Payer: Medicare Other | Admitting: Family Medicine

## 2019-07-22 ENCOUNTER — Encounter: Payer: Self-pay | Admitting: Family Medicine

## 2019-07-22 VITALS — BP 100/58 | HR 65 | Temp 97.6°F | Ht 59.0 in | Wt 116.2 lb

## 2019-07-22 DIAGNOSIS — Z8639 Personal history of other endocrine, nutritional and metabolic disease: Secondary | ICD-10-CM | POA: Insufficient documentation

## 2019-07-22 DIAGNOSIS — E7800 Pure hypercholesterolemia, unspecified: Secondary | ICD-10-CM | POA: Diagnosis not present

## 2019-07-22 DIAGNOSIS — R251 Tremor, unspecified: Secondary | ICD-10-CM

## 2019-07-22 LAB — CBC
HCT: 42.6 % (ref 36.0–46.0)
Hemoglobin: 14 g/dL (ref 12.0–15.0)
MCHC: 32.7 g/dL (ref 30.0–36.0)
MCV: 89.9 fl (ref 78.0–100.0)
Platelets: 272 10*3/uL (ref 150.0–400.0)
RBC: 4.74 Mil/uL (ref 3.87–5.11)
RDW: 13.7 % (ref 11.5–15.5)
WBC: 3.6 10*3/uL — ABNORMAL LOW (ref 4.0–10.5)

## 2019-07-22 LAB — URINALYSIS, ROUTINE W REFLEX MICROSCOPIC
Bilirubin Urine: NEGATIVE
Hgb urine dipstick: NEGATIVE
Ketones, ur: NEGATIVE
Leukocytes,Ua: NEGATIVE
Nitrite: NEGATIVE
RBC / HPF: NONE SEEN (ref 0–?)
Specific Gravity, Urine: 1.015 (ref 1.000–1.030)
Total Protein, Urine: NEGATIVE
Urine Glucose: NEGATIVE
Urobilinogen, UA: 0.2 (ref 0.0–1.0)
pH: 8.5 — AB (ref 5.0–8.0)

## 2019-07-22 LAB — LIPID PANEL
Cholesterol: 193 mg/dL (ref 0–200)
HDL: 54.8 mg/dL (ref >39.00)
LDL Cholesterol: 122 mg/dL — ABNORMAL HIGH (ref 0–99)
NonHDL: 138.54
Total CHOL/HDL Ratio: 4
Triglycerides: 84 mg/dL (ref 0.0–149.0)
VLDL: 16.8 mg/dL (ref 0.0–40.0)

## 2019-07-22 LAB — COMPREHENSIVE METABOLIC PANEL
ALT: 12 U/L (ref 0–35)
AST: 17 U/L (ref 0–37)
Albumin: 4.1 g/dL (ref 3.5–5.2)
Alkaline Phosphatase: 67 U/L (ref 39–117)
BUN: 13 mg/dL (ref 6–23)
CO2: 31 mEq/L (ref 19–32)
Calcium: 9.5 mg/dL (ref 8.4–10.5)
Chloride: 105 mEq/L (ref 96–112)
Creatinine, Ser: 0.61 mg/dL (ref 0.40–1.20)
GFR: 96.17 mL/min (ref >60.00)
Glucose, Bld: 98 mg/dL (ref 70–99)
Potassium: 4.8 mEq/L (ref 3.5–5.1)
Sodium: 143 mEq/L (ref 135–145)
Total Bilirubin: 0.5 mg/dL (ref 0.2–1.2)
Total Protein: 6.4 g/dL (ref 6.0–8.3)

## 2019-07-22 LAB — TSH: TSH: 1.3 u[IU]/mL (ref 0.35–4.50)

## 2019-07-22 LAB — HEMOGLOBIN A1C: Hgb A1c MFr Bld: 5.7 % (ref 4.6–6.5)

## 2019-07-22 NOTE — Progress Notes (Signed)
New Patient Office Visit  Subjective:  Patient ID: Brenda Delgado, female    DOB: 10/20/1945  Age: 73 y.o. MRN: [id number]  CC:  Chief Complaint  Patient presents with  . Establish Care  . Diabetes    Pt was dx with diabetes, but was never placed on medication. Has lost weight would like to see if she is still diabetic     HPI [name] presents for establishment of care and follow-up of her elevated hemoglobin A1c to 6.5 and elevated LDL to 145 with an HDL of 70.  Patient has had a 32 pound intentional weight loss since that time in hopes that her lab work is improved.  She lives with her husband.  They exercise by walking daily.  She has had her flu shot this year.  Colonoscopy within the last year and a half.  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  She takes Paxil for anxiety with good effect.  Recent bone densitometry showed osteopenia.  She supplements with calcium and vitamin D.  Past Medical History:  Diagnosis Date  . Arthritis    right knee  . Complication of anesthesia    per pt with surgery 05-14-2019 right facial swelling and lip/ mouth blister's  . Constipation   . Environmental and seasonal allergies   . Hiatal hernia   . Left ureteral stone   . Mild asthma   . Nephrolithiasis   . Nocturia   . PONV (postoperative nausea and vomiting)   . Pre-diabetes   . Renal calculus, left   . Wears glasses   . Wears hearing aid in both ears     Past Surgical History:  Procedure Laterality Date  . ABDOMINAL HYSTERECTOMY    . ANTERIOR CERVICAL DECOMP/DISCECTOMY FUSION  01-06-2010   '@[facility]'    C4--5  . CARPAL TUNNEL RELEASE Right 2000  . CYSTOCELE REPAIR  09/11/2011   Procedure: ANTERIOR REPAIR (CYSTOCELE);  Surgeon: Olga Millers, MD;  Location: [facility];  Service: Gynecology;  Laterality: N/A;  . CYSTOSCOPY  09/11/2011   Procedure: CYSTOSCOPY;  Surgeon: Olga Millers, MD;  Location: [facility];  Service: Gynecology;  Laterality: N/A;  . CYSTOSCOPY WITH RETROGRADE  PYELOGRAM, URETEROSCOPY AND STENT PLACEMENT Left 05/28/2019   Procedure: CYSTOSCOPY WITH RETROGRADE PYELOGRAM, URETEROSCOPY AND STENT REPLACEMENT;  Surgeon: Alexis Frock, MD;  Location[facility];  Service: Urology;  Laterality: Left;  . CYSTOSCOPY WITH STENT PLACEMENT Left 05/14/2019   Procedure: CYSTOSCOPY WITH STENT PLACEMENT, RETROGRADE;  Surgeon: Bjorn Loser, MD;  Location: [facility];  Service: Urology;  Laterality: Left;  . ELBOW SURGERY Right   . EXCISION VULVA CYST Bilateral 01-16-2005  '@[facility]'   . HOLMIUM LASER APPLICATION Left 58/04/9832   Procedure: HOLMIUM LASER APPLICATION;  Surgeon: Alexis Frock, MD;  Location: [facility];  Service: Urology;  Laterality: Left;  . KNEE ARTHROSCOPY Right 2005;    02-11-2013  . KNEE SURGERY Right 08-20-2012   '@[facility]'    peroneal nerve neurolysis proximal tibia/ fibular articular branch   . LAPAROSCOPIC CHOLECYSTECTOMY  1990s  . SACROSPINOUS LIGAMENT FIXATION  04-05-2017   '@[facility]'    UPHOLD vaginal mesh and  Anterior Repair  . SALPINGOOPHORECTOMY  09/11/2011   Procedure: SALPINGO OOPHERECTOMY;  Surgeon: Olga Millers, MD;  Location: [facility];  Service: Gynecology;  Laterality: Bilateral;  . SHOULDER SURGERY Right   . TONSILLECTOMY    . VAGINAL HYSTERECTOMY  09/11/2011   Procedure: HYSTERECTOMY VAGINAL;  Surgeon: Olga Millers,  MD;  Location: [facility];  Service: Gynecology;  Laterality: N/A;  . Clayborne Dana Milagros Loll BIOPSY  09/11/2011   Procedure: VULVAR BIOPSY;  Surgeon: Olga Millers, MD;  Location: [facility];  Service: Gynecology;  Laterality: Left;  sebaceous cyst excision left vula    History reviewed. No pertinent family history.  Social History   Socioeconomic History  . Marital status: Married    Spouse name: Not on file  . Number of children: Not on file  . Years of education: Not on file  . Highest education level: Not on file  Occupational History  . Not on file  Tobacco Use  . Smoking status: Never Smoker    . Smokeless tobacco: Never Used  Substance and Sexual Activity  . Alcohol use: Yes    Comment: occasional  . Drug use: Never  . Sexual activity: Not on file  Other Topics Concern  . Not on file  Social History Narrative  . Not on file   Social Determinants of Health   Financial Resource Strain:   . Difficulty of Paying Living Expenses: Not on file  Food Insecurity:   . Worried About Charity fundraiser in the Last Year: Not on file  . Ran Out of Food in the Last Year: Not on file  Transportation Needs:   . Lack of Transportation (Medical): Not on file  . Lack of Transportation (Non-Medical): Not on file  Physical Activity:   . Days of Exercise per Week: Not on file  . Minutes of Exercise per Session: Not on file  Stress:   . Feeling of Stress : Not on file  Social Connections:   . Frequency of Communication with Friends and Family: Not on file  . Frequency of Social Gatherings with Friends and Family: Not on file  . Attends Religious Services: Not on file  . Active Member of Clubs or Organizations: Not on file  . Attends Archivist Meetings: Not on file  . Marital Status: Not on file  Intimate Partner Violence:   . Fear of Current or Ex-Partner: Not on file  . Emotionally Abused: Not on file  . Physically Abused: Not on file  . Sexually Abused: Not on file    ROS Review of Systems  Constitutional: Negative.   HENT: Negative.   Eyes: Negative for photophobia.  Respiratory: Negative.   Cardiovascular: Negative.   Gastrointestinal: Negative.   Endocrine: Negative for polyphagia and polyuria.  Genitourinary: Negative.   Musculoskeletal: Negative for gait problem and joint swelling.  Allergic/Immunologic: Negative for immunocompromised state.  Neurological: Negative for light-headedness and headaches.  Hematological: Does not bruise/bleed easily.  Psychiatric/Behavioral: Negative.     Objective:   Today's Vitals: BP (!) 100/58   Pulse 65   Temp 97.6  F (36.4 C)   Ht '4\' 11"'  (1.499 m)   Wt 116 lb 3.2 oz (52.7 kg)   SpO2 96%   BMI 23.47 kg/m   Physical Exam Constitutional:      General: She is not in acute distress.    Appearance: Normal appearance. She is normal weight. She is not ill-appearing, toxic-appearing or diaphoretic.  HENT:     Head: Normocephalic and atraumatic.     Right Ear: Tympanic membrane, ear canal and external ear normal. There is no impacted cerumen.     Left Ear: Tympanic membrane, ear canal and external ear normal. There is no impacted cerumen.     Nose: No rhinorrhea.     Mouth/Throat:  Mouth: Mucous membranes are moist.     Pharynx: Oropharynx is clear. No oropharyngeal exudate or posterior oropharyngeal erythema.  Eyes:     General:        Right eye: No discharge.        Left eye: No discharge.     Extraocular Movements: Extraocular movements intact.     Conjunctiva/sclera: Conjunctivae normal.     Pupils: Pupils are equal, round, and reactive to light.  Cardiovascular:     Rate and Rhythm: Normal rate and regular rhythm.  Pulmonary:     Effort: Pulmonary effort is normal.     Breath sounds: Normal breath sounds.  Abdominal:     General: Bowel sounds are normal.  Musculoskeletal:     Cervical back: No rigidity or tenderness.     Right lower leg: No edema.     Left lower leg: No edema.  Lymphadenopathy:     Cervical: No cervical adenopathy.  Skin:    General: Skin is warm and dry.     Coloration: Skin is not jaundiced or pale.  Neurological:     Mental Status: She is alert and oriented to person, place, and time.  Psychiatric:        Mood and Affect: Mood normal.        Behavior: Behavior normal.     Assessment & Plan:   Problem List Items Addressed This Visit      Other   History of elevated glucose   Relevant Orders   Comp Met (CMET)   HgB A1c   Urinalysis, Routine w reflex microscopic   Elevated LDL cholesterol level - Primary   Relevant Orders   CBC   Comp Met (CMET)    Lipid Profile   Tremor   Relevant Orders   TSH      Outpatient Encounter Medications as of 07/22/2019  Medication Sig  . ALBUTEROL IN Inhale into the lungs as needed.  Marland Kitchen aspirin EC 81 MG tablet Take 81 mg by mouth daily.  . Calcium-Vitamin D-Vitamin K 500-500-40 MG-UNT-MCG CHEW Chew 2 tablets by mouth daily.  . Cholecalciferol (VITAMIN D3) 50 MCG (2000 UT) TABS Take 50 mcg by mouth 2 (two) times daily.  . Coenzyme Q10 (COQ10 PO) Take 1 capsule by mouth daily.  Marland Kitchen EPINEPHrine 0.3 mg/0.3 mL IJ SOAJ injection SMARTSIG:1 Pre-Filled Pen Syringe IM As Needed  . Multiple Vitamins-Minerals (ZINC PO) Take 44 mg by mouth daily.  . pantoprazole (PROTONIX) 40 MG tablet Take 40 mg by mouth at bedtime.   Marland Kitchen PARoxetine (PAXIL) 20 MG tablet Take 20 mg by mouth daily.  . [DISCONTINUED] cephALEXin (KEFLEX) 500 MG capsule Take 1 capsule (500 mg total) by mouth 2 (two) times daily. X 3 days. Begin day before next Urology appointment. (Patient not taking: Reported on 07/22/2019)  . [DISCONTINUED] docusate sodium (COLACE) 100 MG capsule Take 100 mg by mouth 2 (two) times daily.  . [DISCONTINUED] ketorolac (TORADOL) 10 MG tablet Take 1 tablet (10 mg total) by mouth every 8 (eight) hours as needed for moderate pain. Or stent discomfort post-operatively (Patient not taking: Reported on 07/22/2019)  . [DISCONTINUED] oxyCODONE (ROXICODONE) 5 MG immediate release tablet Take 1 tablet (5 mg total) by mouth every 6 (six) hours as needed for up to 15 doses for severe pain. Post-operative (Patient not taking: Reported on 07/22/2019)  . [DISCONTINUED] thiamine (VITAMIN B-1) 100 MG tablet Take 100 mg by mouth daily.   No facility-administered encounter medications on file as of 07/22/2019.  Follow-up: Return in about 6 months (around 01/20/2020).   Libby Maw, MD

## 2019-07-24 ENCOUNTER — Other Ambulatory Visit: Payer: Self-pay

## 2019-07-24 DIAGNOSIS — D0592 Unspecified type of carcinoma in situ of left breast: Secondary | ICD-10-CM | POA: Diagnosis not present

## 2019-07-24 DIAGNOSIS — D0512 Intraductal carcinoma in situ of left breast: Secondary | ICD-10-CM | POA: Diagnosis not present

## 2019-07-25 ENCOUNTER — Telehealth: Payer: Self-pay | Admitting: *Deleted

## 2019-07-25 NOTE — Telephone Encounter (Signed)
Confirmed [facility] for 07/30/19 at 0815 .  Instructions and contact information given.

## 2019-07-28 ENCOUNTER — Encounter: Payer: Self-pay | Admitting: *Deleted

## 2019-07-28 DIAGNOSIS — D0512 Intraductal carcinoma in situ of left breast: Secondary | ICD-10-CM

## 2019-07-28 DIAGNOSIS — J301 Allergic rhinitis due to pollen: Secondary | ICD-10-CM | POA: Diagnosis not present

## 2019-07-28 DIAGNOSIS — J3089 Other allergic rhinitis: Secondary | ICD-10-CM | POA: Diagnosis not present

## 2019-07-28 DIAGNOSIS — J3081 Allergic rhinitis due to animal (cat) (dog) hair and dander: Secondary | ICD-10-CM | POA: Diagnosis not present

## 2019-07-29 NOTE — Progress Notes (Signed)
Radiation Oncology         [phone] ________________________________  Initial outpatient Consultation in person  Name: Brenda Delgado MRN: [id number]  Date: 07/30/2019  DOB: 07-22-1946  [id number], Mortimer Fries, MD  Alphonsa Overall, MD   REFERRING PHYSICIAN: Alphonsa Overall, MD  DIAGNOSIS:    ICD-10-CM   1. Ductal carcinoma in situ (DCIS) of left breast  D05.12    Stage 0 Left Breast UOQ DCIS, ER+ / PR+, Grade 3  CHIEF COMPLAINT: Here to discuss management of left breast DCIS  HISTORY OF PRESENT ILLNESS::Brenda Delgado is a 73 y.o. female who presented with breast abnormality on the following imaging: screening mammogram on the date of 07/07/2019.  Symptoms, if any, at that time, were: none.   Ultrasound of left breast on 07/11/2019 revealed 1.3 cm lesion in the left breast at 2 o'clock.   Biopsy on date of 07/24/2019 showed DCIS with calcifications and necrosis.  ER status: positive; PR status positive; Grade 3.  She is in her usual state of health.  She is here with her husband today.  PREVIOUS RADIATION THERAPY: No  PAST MEDICAL HISTORY:  has a past medical history of Arthritis, Complication of anesthesia, Constipation, Environmental and seasonal allergies, Family history of colon cancer, Family history of ovarian cancer, Hiatal hernia, Left ureteral stone, Mild asthma, Nephrolithiasis, Nocturia, PONV (postoperative nausea and vomiting), Pre-diabetes, Renal calculus, left, Wears glasses, and Wears hearing aid in both ears.    PAST SURGICAL HISTORY: Past Surgical History:  Procedure Laterality Date  . ABDOMINAL HYSTERECTOMY    . ANTERIOR CERVICAL DECOMP/DISCECTOMY FUSION  01-06-2010   @[facility]    C4--5  . CARPAL TUNNEL RELEASE Right 2000  . CYSTOCELE REPAIR  09/11/2011   Procedure: ANTERIOR REPAIR (CYSTOCELE);  Surgeon: Olga Millers, MD;  Location: [facility];  Service: Gynecology;  Laterality: N/A;  . CYSTOSCOPY  09/11/2011   Procedure: CYSTOSCOPY;  Surgeon: Olga Millers, MD;   Location: [facility];  Service: Gynecology;  Laterality: N/A;  . CYSTOSCOPY WITH RETROGRADE PYELOGRAM, URETEROSCOPY AND STENT PLACEMENT Left 05/28/2019   Procedure: CYSTOSCOPY WITH RETROGRADE PYELOGRAM, URETEROSCOPY AND STENT REPLACEMENT;  Surgeon: Alexis Frock, MD;  Location[facility];  Service: Urology;  Laterality: Left;  . CYSTOSCOPY WITH STENT PLACEMENT Left 05/14/2019   Procedure: CYSTOSCOPY WITH STENT PLACEMENT, RETROGRADE;  Surgeon: Bjorn Loser, MD;  Location: [facility];  Service: Urology;  Laterality: Left;  . ELBOW SURGERY Right   . EXCISION VULVA CYST Bilateral 01-16-2005  @[facility]   . HOLMIUM LASER APPLICATION Left [id number]   Procedure: HOLMIUM LASER APPLICATION;  Surgeon: Alexis Frock, MD;  Location: [facility];  Service: Urology;  Laterality: Left;  . KNEE ARTHROSCOPY Right 2005;    02-11-2013  . KNEE SURGERY Right 08-20-2012   @[facility]    peroneal nerve neurolysis proximal tibia/ fibular articular branch   . LAPAROSCOPIC CHOLECYSTECTOMY  1990s  . SACROSPINOUS LIGAMENT FIXATION  04-05-2017   @[facility]    UPHOLD vaginal mesh and  Anterior Repair  . SALPINGOOPHORECTOMY  09/11/2011   Procedure: SALPINGO OOPHERECTOMY;  Surgeon: Olga Millers, MD;  Location: [facility];  Service: Gynecology;  Laterality: Bilateral;  . SHOULDER SURGERY Right   . TONSILLECTOMY    . VAGINAL HYSTERECTOMY  09/11/2011   Procedure: HYSTERECTOMY VAGINAL;  Surgeon: Olga Millers, MD;  Location: [facility];  Service: Gynecology;  Laterality: N/A;  . Clayborne Dana Milagros Loll BIOPSY  09/11/2011   Procedure: VULVAR BIOPSY;  Surgeon: Olga Millers, MD;  Location: [facility];  Service: Gynecology;  Laterality: Left;  sebaceous cyst excision left vula    FAMILY HISTORY: family history includes Cancer in her cousin; Colon cancer in her paternal uncle; Ovarian cancer (age of onset: 91) in her paternal aunt; Skin cancer in her mother.  SOCIAL HISTORY:  reports that she has never smoked. She has never used  smokeless tobacco. She reports current alcohol use. She reports that she does not use drugs.  ALLERGIES: Pravastatin, Erythromycin, Iohexol, Amoxicillin-pot clavulanate, Codeine, Moxifloxacin, and Sulfamethoxazole  MEDICATIONS:  Current Outpatient Medications  Medication Sig Dispense Refill  . ALBUTEROL IN Inhale into the lungs as needed.    Marland Kitchen aspirin EC 81 MG tablet Take 81 mg by mouth daily.    . Calcium-Vitamin D-Vitamin K 500-500-40 MG-UNT-MCG CHEW Chew 2 tablets by mouth daily.    . Cholecalciferol (VITAMIN D3) 50 MCG (2000 UT) TABS Take 50 mcg by mouth 2 (two) times daily.    . Coenzyme Q10 (COQ10 PO) Take 1 capsule by mouth daily.    Marland Kitchen EPINEPHrine 0.3 mg/0.3 mL IJ SOAJ injection SMARTSIG:1 Pre-Filled Pen Syringe IM As Needed    . Multiple Vitamins-Minerals (ZINC PO) Take 44 mg by mouth daily.    . pantoprazole (PROTONIX) 40 MG tablet Take 40 mg by mouth at bedtime.     Marland Kitchen PARoxetine (PAXIL) 20 MG tablet Take 20 mg by mouth daily.     No current facility-administered medications for this encounter.    REVIEW OF SYSTEMS: As above.   PHYSICAL EXAM:  vitals were not taken for this visit.   General: Alert and oriented, in no acute distress Skin: No concerning lesions. Neurologic: Resting tremor of head. Psychiatric: Judgment and insight are intact. Affect is appropriate. Breasts: In the upper outer quadrant of the left breast there is a 1 to 2 cm mass at the area of the biopsy. No other palpable masses appreciated in the breasts or axillae bilaterally.    ECOG = 1  0 - Asymptomatic (Fully active, able to carry on all predisease activities without restriction)  1 - Symptomatic but completely ambulatory (Restricted in physically strenuous activity but ambulatory and able to carry out work of a light or sedentary nature. For example, light housework, office work)  2 - Symptomatic, <50% in bed during the day (Ambulatory and capable of all self care but unable to carry out any work  activities. Up and about more than 50% of waking hours)  3 - Symptomatic, >50% in bed, but not bedbound (Capable of only limited self-care, confined to bed or chair 50% or more of waking hours)  4 - Bedbound (Completely disabled. Cannot carry on any self-care. Totally confined to bed or chair)  5 - Death   Eustace Pen MM, Creech RH, Tormey DC, et al. [id number]). "Toxicity and response criteria of the [facility]". [place] Oncol. 5 (6): 649-55   LABORATORY DATA:  Lab Results  Component Value Date   WBC 4.0 07/30/2019   HGB 13.9 07/30/2019   HCT 43.9 07/30/2019   MCV 92.2 07/30/2019   PLT 305 07/30/2019   CMP     Component Value Date/Time   NA 143 07/30/2019 0844   K 4.4 07/30/2019 0844   CL 104 07/30/2019 0844   CO2 30 07/30/2019 0844   GLUCOSE 97 07/30/2019 0844   BUN 16 07/30/2019 0844   CREATININE 0.67 07/30/2019 0844   CALCIUM 8.9 07/30/2019 0844   PROT 6.5 07/30/2019 0844   ALBUMIN 3.9 07/30/2019 0844  AST 18 07/30/2019 0844   ALT 12 07/30/2019 0844   ALKPHOS 72 07/30/2019 0844   BILITOT 0.4 07/30/2019 0844   GFRNONAA >60 07/30/2019 0844   GFRAA >60 07/30/2019 0844         RADIOGRAPHY:  As above     IMPRESSION/PLAN: Left Breast DCIS   It was a pleasure meeting the patient today. We discussed the risks, benefits, and side effects of radiotherapy. I recommend radiotherapy to the left breast to reduce her risk of locoregional recurrence by 1/2.  We discussed that radiation would take approximately 4 weeks to complete and that I would give the patient a few weeks to heal following surgery before starting treatment planning. We spoke about acute effects including skin irritation and fatigue as well as much less common late effects including internal organ injury or irritation. We spoke about the latest technology that is used to minimize the risk of late effects for patients undergoing radiotherapy to the breast or chest wall. No guarantees of  treatment were given. The patient is enthusiastic about proceeding with treatment. I look forward to participating in the patient's care.  I will await her referral back to me for postoperative follow-up and eventual CT simulation/treatment planning.  I spent 20 minutes  face to face with the patient and more than 50% of that time was spent in counseling and/or coordination of care.   __________________________________________   Eppie Gibson, MD   This document serves as a record of services personally performed by Eppie Gibson, MD. It was created on her behalf by Wilburn Mylar, a trained medical scribe. The creation of this record is based on the scribe's personal observations and the provider's statements to them. This document has been checked and approved by the attending provider.

## 2019-07-29 NOTE — Progress Notes (Signed)
[place] NOTE  Patient Care Team: Libby Maw, MD as PCP - General (Family Medicine) Mauro Kaufmann, RN as Oncology Nurse Navigator Rockwell Germany, RN as Oncology Nurse Navigator Alphonsa Overall, MD as Consulting Physician (General Surgery) Nicholas Lose, MD as Consulting Physician (Hematology and Oncology) Eppie Gibson, MD as Attending Physician (Radiation Oncology)  CHIEF COMPLAINTS/PURPOSE OF CONSULTATION:  Newly diagnosed breast cancer  HISTORY OF PRESENTING ILLNESS:  Brenda Delgado 73 y.o. female is here because of recent diagnosis of left breast DCIS. The cancer was detected on a routine screening mammogram on 07/07/19. Diagnostic mammogram of the left breast on 07/11/19 showed a 1.9cm group of calcifications in the left breast. Biopsy on 07/24/19 showed ductal carcinoma in situ, high grade, ER+ 95%, PR+ 90%. She presents to the clinic today for initial evaluation and discussion of treatment options.   I reviewed her records extensively and collaborated the history with the patient.  SUMMARY OF ONCOLOGIC HISTORY: Oncology History  Ductal carcinoma in situ (DCIS) of left breast  07/28/2019 Initial Diagnosis   Routine screening mammogram detected a 1.9cm group of calcifications in the left breast. Biopsy showed DCIS, high grade, ER+ 95%, PR+ 90%.   07/30/2019 Cancer Staging   Staging form: Breast, AJCC 8th Edition - Clinical: Stage 0 (cTis (DCIS), cN0, cM0, G3, ER+, PR+, HER2: Not Assessed) - Signed by Nicholas Lose, MD on 07/30/2019     MEDICAL HISTORY:  Past Medical History:  Diagnosis Date  . Arthritis    right knee  . Complication of anesthesia    per pt with surgery 05-14-2019 right facial swelling and lip/ mouth blister's  . Constipation   . Environmental and seasonal allergies   . Family history of colon cancer   . Family history of ovarian cancer   . Hiatal hernia   . Left ureteral stone   . Mild asthma   .  Nephrolithiasis   . Nocturia   . PONV (postoperative nausea and vomiting)   . Pre-diabetes   . Renal calculus, left   . Wears glasses   . Wears hearing aid in both ears     SURGICAL HISTORY: Past Surgical History:  Procedure Laterality Date  . ABDOMINAL HYSTERECTOMY    . ANTERIOR CERVICAL DECOMP/DISCECTOMY FUSION  01-06-2010   _0    C4--5  . CARPAL TUNNEL RELEASE Right 2000  . CYSTOCELE REPAIR  09/11/2011   Procedure: ANTERIOR REPAIR (CYSTOCELE);  Surgeon: Olga Millers, MD;  Location: [facility];  Service: Gynecology;  Laterality: N/A;  . CYSTOSCOPY  09/11/2011   Procedure: CYSTOSCOPY;  Surgeon: Olga Millers, MD;  Location: [facility];  Service: Gynecology;  Laterality: N/A;  . CYSTOSCOPY WITH RETROGRADE PYELOGRAM, URETEROSCOPY AND STENT PLACEMENT Left 05/28/2019   Procedure: CYSTOSCOPY WITH RETROGRADE PYELOGRAM, URETEROSCOPY AND STENT REPLACEMENT;  Surgeon: Alexis Frock, MD;  Location[facility];  Service: Urology;  Laterality: Left;  . CYSTOSCOPY WITH STENT PLACEMENT Left 05/14/2019   Procedure: CYSTOSCOPY WITH STENT PLACEMENT, RETROGRADE;  Surgeon: Bjorn Loser, MD;  Location: [facility];  Service: Urology;  Laterality: Left;  . ELBOW SURGERY Right   . EXCISION VULVA CYST Bilateral 01-16-2005  _1   . HOLMIUM LASER APPLICATION Left 46/28/6381   Procedure: HOLMIUM LASER APPLICATION;  Surgeon: Alexis Frock, MD;  Location: [facility];  Service: Urology;  Laterality: Left;  . KNEE ARTHROSCOPY Right 2005;    02-11-2013  . KNEE SURGERY Right 08-20-2012   _2    peroneal nerve neurolysis  proximal tibia/ fibular articular branch   . LAPAROSCOPIC CHOLECYSTECTOMY  1990s  . SACROSPINOUS LIGAMENT FIXATION  04-05-2017   _0    UPHOLD vaginal mesh and  Anterior Repair  . SALPINGOOPHORECTOMY  09/11/2011   Procedure: SALPINGO OOPHERECTOMY;  Surgeon: Olga Millers, MD;  Location: [facility];  Service: Gynecology;  Laterality: Bilateral;  . SHOULDER SURGERY Right    . TONSILLECTOMY    . VAGINAL HYSTERECTOMY  09/11/2011   Procedure: HYSTERECTOMY VAGINAL;  Surgeon: Olga Millers, MD;  Location: [facility];  Service: Gynecology;  Laterality: N/A;  . Clayborne Dana Milagros Loll BIOPSY  09/11/2011   Procedure: VULVAR BIOPSY;  Surgeon: Olga Millers, MD;  Location: [facility];  Service: Gynecology;  Laterality: Left;  sebaceous cyst excision left vula    SOCIAL HISTORY: Social History   Socioeconomic History  . Marital status: Married    Spouse name: Not on file  . Number of children: Not on file  . Years of education: Not on file  . Highest education level: Not on file  Occupational History  . Not on file  Tobacco Use  . Smoking status: Never Smoker  . Smokeless tobacco: Never Used  Substance and Sexual Activity  . Alcohol use: Yes    Comment: occasional  . Drug use: Never  . Sexual activity: Not on file  Other Topics Concern  . Not on file  Social History Narrative  . Not on file   Social Determinants of Health   Financial Resource Strain:   . Difficulty of Paying Living Expenses: Not on file  Food Insecurity:   . Worried About Charity fundraiser in the Last Year: Not on file  . Ran Out of Food in the Last Year: Not on file  Transportation Needs:   . Lack of Transportation (Medical): Not on file  . Lack of Transportation (Non-Medical): Not on file  Physical Activity:   . Days of Exercise per Week: Not on file  . Minutes of Exercise per Session: Not on file  Stress:   . Feeling of Stress : Not on file  Social Connections:   . Frequency of Communication with Friends and Family: Not on file  . Frequency of Social Gatherings with Friends and Family: Not on file  . Attends Religious Services: Not on file  . Active Member of Clubs or Organizations: Not on file  . Attends Archivist Meetings: Not on file  . Marital Status: Not on file  Intimate Partner Violence:   . Fear of Current or Ex-Partner: Not on file  . Emotionally Abused: Not on  file  . Physically Abused: Not on file  . Sexually Abused: Not on file    FAMILY HISTORY: Family History  Problem Relation Age of Onset  . Skin cancer Mother   . Colon cancer Paternal Uncle        dx late 58s-60s  . Ovarian cancer Paternal Aunt 38  . Cancer Cousin        unk type    ALLERGIES:  is allergic to pravastatin; erythromycin; iohexol; amoxicillin-pot clavulanate; codeine; moxifloxacin; and sulfamethoxazole.  MEDICATIONS:  Current Outpatient Medications  Medication Sig Dispense Refill  . ALBUTEROL IN Inhale into the lungs as needed.    Marland Kitchen aspirin EC 81 MG tablet Take 81 mg by mouth daily.    . Calcium-Vitamin D-Vitamin K 500-500-40 MG-UNT-MCG CHEW Chew 2 tablets by mouth daily.    . Cholecalciferol (VITAMIN D3) 50 MCG (2000 UT) TABS Take 50 mcg by  mouth 2 (two) times daily.    . Coenzyme Q10 (COQ10 PO) Take 1 capsule by mouth daily.    . EPINEPHrine 0.3 mg/0.3 mL IJ SOAJ injection SMARTSIG:1 Pre-Filled Pen Syringe IM As Needed    . Multiple Vitamins-Minerals (ZINC PO) Take 44 mg by mouth daily.    . pantoprazole (PROTONIX) 40 MG tablet Take 40 mg by mouth at bedtime.     . PARoxetine (PAXIL) 20 MG tablet Take 20 mg by mouth daily.     No current facility-administered medications for this visit.    REVIEW OF SYSTEMS:   Constitutional: Denies fevers, chills or abnormal night sweats Eyes: Denies blurriness of vision, double vision or watery eyes Ears, nose, mouth, throat, and face: Denies mucositis or sore throat Respiratory: Denies cough, dyspnea or wheezes Cardiovascular: Denies palpitation, chest discomfort or lower extremity swelling Gastrointestinal:  Denies nausea, heartburn or change in bowel habits Skin: Denies abnormal skin rashes Lymphatics: Denies new lymphadenopathy or easy bruising Neurological:Denies numbness, tingling or new weaknesses Behavioral/Psych: Mood is stable, no new changes  Breast: Denies any palpable lumps or discharge All other systems  were reviewed with the patient and are negative.  PHYSICAL EXAMINATION: ECOG PERFORMANCE STATUS: 1 - Symptomatic but completely ambulatory  Vitals:   07/30/19 0906  BP: 133/65  Pulse: 68  Resp: 17  Temp: 97.8 F (36.6 C)  SpO2: 98%   Filed Weights   07/30/19 0906  Weight: 117 lb 3.2 oz (53.2 kg)    GENERAL:alert, no distress and comfortable SKIN: skin color, texture, turgor are normal, no rashes or significant lesions EYES: normal, conjunctiva are pink and non-injected, sclera clear OROPHARYNX:no exudate, no erythema and lips, buccal mucosa, and tongue normal  NECK: supple, thyroid normal size, non-tender, without nodularity LYMPH:  no palpable lymphadenopathy in the cervical, axillary or inguinal LUNGS: clear to auscultation and percussion with normal breathing effort HEART: regular rate & rhythm and no murmurs and no lower extremity edema ABDOMEN:abdomen soft, non-tender and normal bowel sounds Musculoskeletal:no cyanosis of digits and no clubbing  PSYCH: alert & oriented x 3 with fluent speech NEURO: no focal motor/sensory deficits BREAST: No palpable nodules in breast. No palpable axillary or supraclavicular lymphadenopathy (exam performed in the presence of a chaperone)   LABORATORY DATA:  I have reviewed the data as listed Lab Results  Component Value Date   WBC 4.0 07/30/2019   HGB 13.9 07/30/2019   HCT 43.9 07/30/2019   MCV 92.2 07/30/2019   PLT 305 07/30/2019   Lab Results  Component Value Date   NA 143 07/30/2019   K 4.4 07/30/2019   CL 104 07/30/2019   CO2 30 07/30/2019    RADIOGRAPHIC STUDIES: I have personally reviewed the radiological reports and agreed with the findings in the report.  ASSESSMENT AND PLAN:  Ductal carcinoma in situ (DCIS) of left breast 07/28/2019:Routine screening mammogram detected a 1.9cm group of calcifications in the left breast. Biopsy showed DCIS, high grade, ER+ 95%, PR+ 90%. Tis NX stage 0  Pathology review: I  discussed with the patient the difference between DCIS and invasive breast cancer. It is considered a precancerous lesion. DCIS is classified as a 0. It is generally detected through mammograms as calcifications. We discussed the significance of grades and its impact on prognosis. We also discussed the importance of ER and PR receptors and their implications to adjuvant treatment options. Prognosis of DCIS dependence on grade, comedo necrosis. It is anticipated that if not treated, 20-30% of DCIS can develop   into invasive breast cancer.  Recommendation: 1. Breast conserving surgery 2. Followed by adjuvant radiation therapy 3. Followed by antiestrogen therapy with tamoxifen 5 years  Tamoxifen counseling: We discussed the risks and benefits of tamoxifen. These include but not limited to insomnia, hot flashes, mood changes, vaginal dryness, and weight gain. Although rare, serious side effects including endometrial cancer, risk of blood clots were also discussed. We strongly believe that the benefits far outweigh the risks. Patient understands these risks and consented to starting treatment. Planned treatment duration is 5 years.  Return to clinic after surgery to discuss the final pathology report and come up with an adjuvant treatment plan.     All questions were answered. The patient knows to call the clinic with any problems, questions or concerns.   Rulon Eisenmenger, MD, MPH 07/30/2019    I, Molly Dorshimer, am acting as scribe for Nicholas Lose, MD.  I have reviewed the above documentation for accuracy and completeness, and I agree with the above.

## 2019-07-30 ENCOUNTER — Encounter: Payer: Self-pay | Admitting: Hematology and Oncology

## 2019-07-30 ENCOUNTER — Inpatient Hospital Stay: Payer: Medicare Other | Attending: Hematology and Oncology | Admitting: Hematology and Oncology

## 2019-07-30 ENCOUNTER — Other Ambulatory Visit: Payer: Self-pay

## 2019-07-30 ENCOUNTER — Encounter: Payer: Self-pay | Admitting: Licensed Clinical Social Worker

## 2019-07-30 ENCOUNTER — Ambulatory Visit: Payer: Medicare Other | Admitting: Licensed Clinical Social Worker

## 2019-07-30 ENCOUNTER — Ambulatory Visit: Payer: Medicare Other | Admitting: Physical Therapy

## 2019-07-30 ENCOUNTER — Inpatient Hospital Stay: Payer: Medicare Other

## 2019-07-30 ENCOUNTER — Ambulatory Visit
Admission: RE | Admit: 2019-07-30 | Discharge: 2019-07-30 | Disposition: A | Discharge status: Home / Self Care | Payer: Medicare Other | Source: Ambulatory Visit | Attending: Radiation Oncology | Admitting: Radiation Oncology

## 2019-07-30 ENCOUNTER — Other Ambulatory Visit: Payer: Self-pay | Admitting: Surgery

## 2019-07-30 DIAGNOSIS — Z79899 Other long term (current) drug therapy: Secondary | ICD-10-CM | POA: Diagnosis not present

## 2019-07-30 DIAGNOSIS — D0512 Intraductal carcinoma in situ of left breast: Secondary | ICD-10-CM | POA: Diagnosis not present

## 2019-07-30 DIAGNOSIS — Z170 Estrogen receptor positive status [ER+]: Secondary | ICD-10-CM

## 2019-07-30 DIAGNOSIS — Z800 Family history of malignant neoplasm of digestive organs: Secondary | ICD-10-CM | POA: Insufficient documentation

## 2019-07-30 DIAGNOSIS — Z8041 Family history of malignant neoplasm of ovary: Secondary | ICD-10-CM | POA: Insufficient documentation

## 2019-07-30 DIAGNOSIS — D0592 Unspecified type of carcinoma in situ of left breast: Secondary | ICD-10-CM | POA: Diagnosis not present

## 2019-07-30 DIAGNOSIS — Z90721 Acquired absence of ovaries, unilateral: Secondary | ICD-10-CM | POA: Insufficient documentation

## 2019-07-30 DIAGNOSIS — Z808 Family history of malignant neoplasm of other organs or systems: Secondary | ICD-10-CM | POA: Diagnosis not present

## 2019-07-30 DIAGNOSIS — C50412 Malignant neoplasm of upper-outer quadrant of left female breast: Secondary | ICD-10-CM

## 2019-07-30 LAB — CMP (CANCER CENTER ONLY)
ALT: 12 U/L (ref 0–44)
AST: 18 U/L (ref 15–41)
Albumin: 3.9 g/dL (ref 3.5–5.0)
Alkaline Phosphatase: 72 U/L (ref 38–126)
Anion gap: 9 (ref 5–15)
BUN: 16 mg/dL (ref 8–23)
CO2: 30 mmol/L (ref 22–32)
Calcium: 8.9 mg/dL (ref 8.9–10.3)
Chloride: 104 mmol/L (ref 98–111)
Creatinine: 0.67 mg/dL (ref 0.44–1.00)
GFR, Est AFR Am: >60 mL/min (ref >60)
GFR, Estimated: >60 mL/min (ref >60)
Glucose, Bld: 97 mg/dL (ref 70–99)
Potassium: 4.4 mmol/L (ref 3.5–5.1)
Sodium: 143 mmol/L (ref 135–145)
Total Bilirubin: 0.4 mg/dL (ref 0.3–1.2)
Total Protein: 6.5 g/dL (ref 6.5–8.1)

## 2019-07-30 LAB — CBC WITH DIFFERENTIAL (CANCER CENTER ONLY)
Abs Immature Granulocytes: 0.01 10*3/uL (ref 0.00–0.07)
Basophils Absolute: 0 10*3/uL (ref 0.0–0.1)
Basophils Relative: 1 %
Eosinophils Absolute: 0.1 10*3/uL (ref 0.0–0.5)
Eosinophils Relative: 4 %
HCT: 43.9 % (ref 36.0–46.0)
Hemoglobin: 13.9 g/dL (ref 12.0–15.0)
Immature Granulocytes: 0 %
Lymphocytes Relative: 41 %
Lymphs Abs: 1.6 10*3/uL (ref 0.7–4.0)
MCH: 29.2 pg (ref 26.0–34.0)
MCHC: 31.7 g/dL (ref 30.0–36.0)
MCV: 92.2 fL (ref 80.0–100.0)
Monocytes Absolute: 0.5 10*3/uL (ref 0.1–1.0)
Monocytes Relative: 11 %
Neutro Abs: 1.7 10*3/uL (ref 1.7–7.7)
Neutrophils Relative %: 43 %
Platelet Count: 305 10*3/uL (ref 150–400)
RBC: 4.76 MIL/uL (ref 3.87–5.11)
RDW: 13.2 % (ref 11.5–15.5)
WBC Count: 4 10*3/uL (ref 4.0–10.5)
nRBC: 0 % (ref 0.0–0.2)

## 2019-07-30 LAB — GENETIC SCREENING ORDER

## 2019-07-30 NOTE — Assessment & Plan Note (Signed)
07/28/2019:Routine screening mammogram detected a 1.9cm group of calcifications in the left breast. Biopsy showed DCIS, high grade, ER+ 95%, PR+ 90%. Tis NX stage 0  Pathology review: I discussed with the patient the difference between DCIS and invasive breast cancer. It is considered a precancerous lesion. DCIS is classified as a 0. It is generally detected through mammograms as calcifications. We discussed the significance of grades and its impact on prognosis. We also discussed the importance of ER and PR receptors and their implications to adjuvant treatment options. Prognosis of DCIS dependence on grade, comedo necrosis. It is anticipated that if not treated, 20-30% of DCIS can develop into invasive breast cancer.  Recommendation: 1. Breast conserving surgery 2. Followed by adjuvant radiation therapy 3. Followed by antiestrogen therapy with tamoxifen 5 years  Tamoxifen counseling: We discussed the risks and benefits of tamoxifen. These include but not limited to insomnia, hot flashes, mood changes, vaginal dryness, and weight gain. Although rare, serious side effects including endometrial cancer, risk of blood clots were also discussed. We strongly believe that the benefits far outweigh the risks. Patient understands these risks and consented to starting treatment. Planned treatment duration is 5 years.  Return to clinic after surgery to discuss the final pathology report and come up with an adjuvant treatment plan.

## 2019-07-30 NOTE — Progress Notes (Signed)
REFERRING PROVIDER: Nicholas Lose, MD [address] [place],  [place] [address]  PRIMARY PROVIDER:  Libby Maw, MD  PRIMARY REASON FOR VISIT:  1. Ductal carcinoma in situ (DCIS) of left breast   2. Family history of ovarian cancer   3. Family history of colon cancer    I connected with Ms. Roepke on 07/30/2019 at 11:00 AM EDT by Webex and verified that I am speaking with the correct person using two identifiers.    Patient location: [facility] Provider location: clinic   HISTORY OF PRESENT ILLNESS:   Ms. Boerema, a 73 y.o. female, was seen for a [place] cancer genetics consultation due to her recent diagnosis of DCIS and family history of cancer. Ms. Martone presents to clinic today to discuss the possibility of a hereditary predisposition to cancer, genetic testing, and to further clarify her future cancer risks, as well as potential cancer risks for family members.   In 2020, at the age of 11, Ms. Lish was diagnosed with DCIS of the left breast. The treatment plan includes surgery, radiation and antiestrogens. Marland Kitchen   CANCER HISTORY:  Oncology History  Ductal carcinoma in situ (DCIS) of left breast  07/28/2019 Initial Diagnosis   Routine screening mammogram detected a 1.9cm group of calcifications in the left breast. Biopsy showed DCIS, high grade, ER+ 95%, PR+ 90%.   07/30/2019 Cancer Staging   Staging form: Breast, AJCC 8th Edition - Clinical: Stage 0 (cTis (DCIS), cN0, cM0, G3, ER+, PR+, HER2: Not Assessed) - Signed by Nicholas Lose, MD on 07/30/2019      RISK FACTORS:  Menarche was at age 54.  First live birth at age 39.  OCP use for approximately 20-30 years.  Ovaries intact: no.  Hysterectomy: yes.  Menopausal status: postmenopausal.  HRT use: 10 years. Colonoscopy: yes; normal, few polyps on last cscope, 5 year recall.  Mammogram within the last year: yes. Number of breast biopsies: 1. Any excessive radiation exposure in the past: no  Past  Medical History:  Diagnosis Date  . Arthritis    right knee  . Complication of anesthesia    per pt with surgery 05-14-2019 right facial swelling and lip/ mouth blister's  . Constipation   . Environmental and seasonal allergies   . Family history of colon cancer   . Family history of ovarian cancer   . Hiatal hernia   . Left ureteral stone   . Mild asthma   . Nephrolithiasis   . Nocturia   . PONV (postoperative nausea and vomiting)   . Pre-diabetes   . Renal calculus, left   . Wears glasses   . Wears hearing aid in both ears     Past Surgical History:  Procedure Laterality Date  . ABDOMINAL HYSTERECTOMY    . ANTERIOR CERVICAL DECOMP/DISCECTOMY FUSION  01-06-2010   '@[facility]'    C4--5  . CARPAL TUNNEL RELEASE Right 2000  . CYSTOCELE REPAIR  09/11/2011   Procedure: ANTERIOR REPAIR (CYSTOCELE);  Surgeon: Olga Millers, MD;  Location: [facility];  Service: Gynecology;  Laterality: N/A;  . CYSTOSCOPY  09/11/2011   Procedure: CYSTOSCOPY;  Surgeon: Olga Millers, MD;  Location: [facility];  Service: Gynecology;  Laterality: N/A;  . CYSTOSCOPY WITH RETROGRADE PYELOGRAM, URETEROSCOPY AND STENT PLACEMENT Left 05/28/2019   Procedure: CYSTOSCOPY WITH RETROGRADE PYELOGRAM, URETEROSCOPY AND STENT REPLACEMENT;  Surgeon: Alexis Frock, MD;  [facility];  Service: Urology;  Laterality: Left;  . CYSTOSCOPY WITH STENT PLACEMENT Left 05/14/2019  Procedure: CYSTOSCOPY WITH STENT PLACEMENT, RETROGRADE;  Surgeon: Bjorn Loser, MD;  Location: [facility];  Service: Urology;  Laterality: Left;  . ELBOW SURGERY Right   . EXCISION VULVA CYST Bilateral 01-16-2005  '@[facility]'   . HOLMIUM LASER APPLICATION Left 50/27/7412   Procedure: HOLMIUM LASER APPLICATION;  Surgeon: Alexis Frock, MD;  Location[facility]  Service: Urology;  Laterality: Left;  . KNEE ARTHROSCOPY Right 2005;    02-11-2013  . KNEE SURGERY Right 08-20-2012   '@[facility]'    peroneal nerve neurolysis proximal tibia/  fibular articular branch   . LAPAROSCOPIC CHOLECYSTECTOMY  1990s  . SACROSPINOUS LIGAMENT FIXATION  04-05-2017   '@[facility]'    UPHOLD vaginal mesh and  Anterior Repair  . SALPINGOOPHORECTOMY  09/11/2011   Procedure: SALPINGO OOPHERECTOMY;  Surgeon: Olga Millers, MD;  Location: [facility];  Service: Gynecology;  Laterality: Bilateral;  . SHOULDER SURGERY Right   . TONSILLECTOMY    . VAGINAL HYSTERECTOMY  09/11/2011   Procedure: HYSTERECTOMY VAGINAL;  Surgeon: Olga Millers, MD;  Location: [facility];  Service: Gynecology;  Laterality: N/A;  . Clayborne Dana Milagros Loll BIOPSY  09/11/2011   Procedure: VULVAR BIOPSY;  Surgeon: Olga Millers, MD;  Location: [facility];  Service: Gynecology;  Laterality: Left;  sebaceous cyst excision left vula    Social History   Socioeconomic History  . Marital status: Married    Spouse name: Not on file  . Number of children: Not on file  . Years of education: Not on file  . Highest education level: Not on file  Occupational History  . Not on file  Tobacco Use  . Smoking status: Never Smoker  . Smokeless tobacco: Never Used  Substance and Sexual Activity  . Alcohol use: Yes    Comment: occasional  . Drug use: Never  . Sexual activity: Not on file  Other Topics Concern  . Not on file  Social History Narrative  . Not on file   Social Determinants of Health   Financial Resource Strain:   . Difficulty of Paying Living Expenses: Not on file  Food Insecurity:   . Worried About Charity fundraiser in the Last Year: Not on file  . Ran Out of Food in the Last Year: Not on file  Transportation Needs:   . Lack of Transportation (Medical): Not on file  . Lack of Transportation (Non-Medical): Not on file  Physical Activity:   . Days of Exercise per Week: Not on file  . Minutes of Exercise per Session: Not on file  Stress:   . Feeling of Stress : Not on file  Social Connections:   . Frequency of Communication with Friends and Family: Not on file  . Frequency of Social  Gatherings with Friends and Family: Not on file  . Attends Religious Services: Not on file  . Active Member of Clubs or Organizations: Not on file  . Attends Archivist Meetings: Not on file  . Marital Status: Not on file     FAMILY HISTORY:  We obtained a detailed, 4-generation family history.  Significant diagnoses are listed below: Family History  Problem Relation Age of Onset  . Skin cancer Mother   . Colon cancer Paternal Uncle        dx late 69s-60s  . Ovarian cancer Paternal Aunt 74  . Cancer Cousin        unk type   Ms. Momon has one son, 53, and one daughter, 71, no history of cancer. Patient has  1 brother living at 28 with no cancer history.  Ms. Vultaggio mother died at 29. She had history of skin cancer. Patient had 2 maternal uncles, no cancers. One female ,maternal cousin is currently going through chemotherapy but patient is unsure the cancer type. Another maternal cousin may have had cancer. Maternal grandmother died at 55, grandfather died at 32.  Ms. Audia father died at 43, no cancer. Patient had 2 paternal aunts, 2 paternal uncles. One of her aunts had ovarian cancer in her 43s, patient has limited information about her. An uncle had colon cancer in his late 29s-60s. No known cancers in paternal cousins. Paternal grandparents are deceased, no known cancers.  Ms. Disla is unaware of previous family history of genetic testing for hereditary cancer risks. Patient's maternal ancestors are of [place] descent, and paternal ancestors are of [place] descent. There is no reported Ashkenazi Jewish ancestry. There is no known consanguinity.  GENETIC COUNSELING ASSESSMENT: Ms. Gajda is a 72 y.o. female with a personal and family history which is somewhat suggestive of a hereditary cancer syndrome and predisposition to cancer. We, therefore, discussed and recommended the following at today's visit.   DISCUSSION: We discussed that 5 - 10% of breast cancer is  hereditary, with most cases associated with BRCA1/BRCA2 mutations.  There are other genes that can be associated with hereditary breast cancer syndromes. We discussed that testing is beneficial for several reasons including surgical decision-making for breast cancer, knowing how to follow individuals after completing their treatment, and understand if other family members could be at risk for cancer and allow them to undergo genetic testing. Patient does not believe that genetic testing would change her mind about having a lumpectomy at this time.   We reviewed the characteristics, features and inheritance patterns of hereditary cancer syndromes. We also discussed genetic testing, including the appropriate family members to test, the process of testing, insurance coverage and turn-around-time for results. We discussed the implications of a negative, positive and/or variant of uncertain significant result. In order to get genetic test results in a timely manner so that Ms. Morr can use these genetic test results for surgical decisions, we recommended Ms. Peine pursue genetic testing for the [facility] Breast Cancer STAT Panel. Once complete, we recommend Ms. Delafuente pursue reflex genetic testing to the Common Hereditary Cancers gene panel.   Based on Ms. Rome's personal and family history of cancer, she meets medical criteria for genetic testing. Despite that she meets criteria, she may still have an out of pocket cost.   PLAN: After considering the risks, benefits, and limitations, Ms. Drach provided informed consent to pursue genetic testing and the blood sample was sent to [facility] for analysis of the Breast Cancer STAT Panel + Common Hereditary Cancers Panel. Results should be available within approximately 1 weeks' time, at which point they will be disclosed by telephone to Ms. Nelon, as will any additional recommendations warranted by these results. Ms. Goldwasser will receive a summary of her genetic  counseling visit and a copy of her results once available. This information will also be available in Epic.   Ms. Beauchamp questions were answered to her satisfaction today. Our contact information was provided should additional questions or concerns arise. Thank you for the referral and allowing [place] to share in the care of your patient.   Faith Rogue, MS, [facility] Genetic Counselor [email or web address] Phone: [phone]  The patient was seen for a total of 25 minutes in face-to-face genetic counseling.  Her  husband was also present. Drs. Magrinat, Lindi Adie and/or Burr Medico were available for discussion regarding this case.   _______________________________________________________________________ For Office Staff:  Number of people involved in session: 2 Was an Intern/ student involved with case: no

## 2019-07-31 ENCOUNTER — Encounter: Payer: Self-pay | Admitting: Radiation Oncology

## 2019-08-05 ENCOUNTER — Telehealth: Payer: Self-pay | Admitting: *Deleted

## 2019-08-05 ENCOUNTER — Encounter (HOSPITAL_BASED_OUTPATIENT_CLINIC_OR_DEPARTMENT_OTHER): Payer: Self-pay | Admitting: Surgery

## 2019-08-05 ENCOUNTER — Other Ambulatory Visit: Payer: Self-pay

## 2019-08-05 DIAGNOSIS — D0512 Intraductal carcinoma in situ of left breast: Secondary | ICD-10-CM

## 2019-08-05 NOTE — Telephone Encounter (Signed)
Spoke to pt concerning [place] from 12.23.20. Denies questions or concerns regarding dx or treatment care plan. Encourage pt to call with needs.

## 2019-08-06 ENCOUNTER — Telehealth: Payer: Self-pay | Admitting: Hematology and Oncology

## 2019-08-06 ENCOUNTER — Telehealth: Payer: Self-pay

## 2019-08-06 ENCOUNTER — Telehealth: Payer: Self-pay | Admitting: Radiation Oncology

## 2019-08-06 NOTE — Telephone Encounter (Signed)
New message: ° ° °LVM for patient to return call to schedule appt from referral received. °

## 2019-08-06 NOTE — Telephone Encounter (Signed)
Nutrition Assessment  Reason for Assessment:  Pt attended Breast Clinic on 07/30/2019 and nurse navigator provided nutrition packet to patient.   ASSESSMENT:  73 year old female with DCIS of left breast.  Planning lumpectomy on 1/5 followed by radiation and antiestrogen.  Patient reports being diagnoses with DM in Feb 2020.  Spoke with patient via phone (patient returned earlier call) to introduce self and service at [facility].  Patient reports that since diagnosis of diabetes she has been following guidelines that daughter provided her with while she had gestational diabetes.  Patient has lost 30 pounds and reduced A1c to 5.7.    Medications:  reviewed  Labs: reviewed  Anthropometrics:   Height: 4'11" Weight: 117 lb BMI: 23   NUTRITION DIAGNOSIS: Food and nutrition related knowledge deficit related to new diagnosis of breast cancer as evidenced by no prior need for nutrition related information.  INTERVENTION:   Discussed briefly packet of information regarding nutritional tips for breast cancer patients. No questions at this time.  Contact information provided and patient knows to contact me with questions/concerns.    MONITORING, EVALUATION, and GOAL: Pt will consume a healthy plant based diet to maintain lean body mass throughout treatment.   Mikita Lesmeister B. Zenia Resides, [place], [place] Registered Dietitian [phone] (pager)

## 2019-08-06 NOTE — Telephone Encounter (Signed)
Nutrition  Patient attended Breast Clinic on 07/30/2019.   Called to introduce self.  No answer.  Left message with call back number.  Breianna Delfino B. Zenia Resides, [place], [place] Registered Dietitian [phone] (pager)

## 2019-08-06 NOTE — Telephone Encounter (Signed)
Scheduled appt per 12/29 sch message- unable to reach pt . Left message with appt date and time   

## 2019-08-07 ENCOUNTER — Encounter: Payer: Self-pay | Admitting: *Deleted

## 2019-08-09 ENCOUNTER — Other Ambulatory Visit (HOSPITAL_COMMUNITY)
Admission: RE | Admit: 2019-08-09 | Discharge: 2019-08-09 | Disposition: A | Discharge status: Home / Self Care | Payer: Medicare Other | Source: Ambulatory Visit | Attending: Surgery | Admitting: Surgery

## 2019-08-09 DIAGNOSIS — Z20822 Contact with and (suspected) exposure to covid-19: Secondary | ICD-10-CM | POA: Insufficient documentation

## 2019-08-09 DIAGNOSIS — Z01812 Encounter for preprocedural laboratory examination: Secondary | ICD-10-CM | POA: Diagnosis present

## 2019-08-09 LAB — SARS CORONAVIRUS 2 (TAT 6-24 HRS): SARS Coronavirus 2: NEGATIVE

## 2019-08-11 DIAGNOSIS — J3089 Other allergic rhinitis: Secondary | ICD-10-CM | POA: Diagnosis not present

## 2019-08-11 DIAGNOSIS — R921 Mammographic calcification found on diagnostic imaging of breast: Secondary | ICD-10-CM | POA: Diagnosis not present

## 2019-08-11 DIAGNOSIS — J301 Allergic rhinitis due to pollen: Secondary | ICD-10-CM | POA: Diagnosis not present

## 2019-08-11 DIAGNOSIS — J3081 Allergic rhinitis due to animal (cat) (dog) hair and dander: Secondary | ICD-10-CM | POA: Diagnosis not present

## 2019-08-11 NOTE — Anesthesia Preprocedure Evaluation (Addendum)
Anesthesia Evaluation  Patient identified by MRN, date of birth, ID band Patient awake    Reviewed: Allergy & Precautions, NPO status , Patient's Chart, lab work & pertinent test results  History of Anesthesia Complications (+) PONV and history of anesthetic complications  Airway Mallampati: II  TM Distance: >3 FB Neck ROM: Full    Dental no notable dental hx.    Pulmonary asthma ,    Pulmonary exam normal        Cardiovascular negative cardio ROS Normal cardiovascular exam     Neuro/Psych Anxiety Depression Hx of cervical fusion 2011    GI/Hepatic Neg liver ROS, hiatal hernia, GERD  Medicated and Controlled,  Endo/Other  negative endocrine ROS  Renal/GU negative Renal ROS  negative genitourinary   Musculoskeletal  (+) Arthritis ,   Abdominal   Peds  Hematology negative hematology ROS (+)   Anesthesia Other Findings Left breast ca  Reproductive/Obstetrics negative OB ROS                           Anesthesia Physical Anesthesia Plan  ASA: III  Anesthesia Plan: General   Post-op Pain Management:    Induction: Intravenous  PONV Risk Score and Plan: 4 or greater and Treatment may vary due to age or medical condition, Ondansetron, Dexamethasone, Midazolam and Propofol infusion  Airway Management Planned:   Additional Equipment: None  Intra-op Plan:   Post-operative Plan: Extubation in OR  Informed Consent: I have reviewed the patients History and Physical, chart, labs and discussed the procedure including the risks, benefits and alternatives for the proposed anesthesia with the patient or authorized representative who has indicated his/her understanding and acceptance.     Dental advisory given  Plan Discussed with: CRNA  Anesthesia Plan Comments:        Anesthesia Quick Evaluation

## 2019-08-11 NOTE — H&P (Signed)
Brenda Delgado  Location: [facility] Surgery Patient #: [id number] DOB: 25-Jan-1946 Undefined / Language: [name] / Race: White Female  History of Present Illness   The patient is a 74 year old female who presents with a complaint of breast cancer.  The PCP is Brenda Delgado  The patient was referred by Brenda Delgado. Brenda Delgado  The patient is at the Breast [facility] - Oncology is Drs. Brenda Delgado and Brenda Delgado Husband Brenda Delgado is with her.  [The Covid-19 virus has disrupted normal medical care in [place] and across the nation. We have sometimes had to alter normal surgical/medical care to limit this epidemic and we have explained these changes to the patient.]  Delgado went for routine mammograms. Delgado felt nothing new in her breast. Mammograms: [facility] - Mammograms on 07/11/2019 - 1.3 x 1.2 x 0.7 cm lesion in the left breast at 2 o'clock Biopsy: left breast biopsy at 2 o'clock - 07/24/2019 [id number]) - DCIS, high grade, ER - 95%, PR - 90% Family history of breast or ovarian cancer: Dad's sister had ovarian ca - Delgado is to see the genetics counselor On hormone therapy: No  I discussed the options for breast cancer treatment with the patient. The patient is at the [facility], which includes medical oncology and radiation oncology. I discussed the surgical options of lumpectomy vs. mastectomy. If mastectomy, there is the possibility of reconstruction. I discussed the options of lymph node biopsy. The treatment plan depends on the pathologic staging of the tumor and the patient's personal wishes. The risks of surgery include, but are not limited to, bleeding, infection, the need for further surgery, and nerve injury. The patient has been given literature on the treatment of breast cancer.  Plan: 1. Left breast lumpectomy (seed localization), 2. Radiation therapy, 3. Antihormone treatment  Past Medical History: 1. Left breast  cancer 2. History of kidney stones - Brenda Delgado had a cystoscopy on 05/28/2019 3. Lap chole - 1990's 4. Sacrospinous ligament fixation - 04/05/2017 - Brenda Delgado 5. Vaginal hysterectomy - 09/11/2011 - Brenda Delgado 6. Anterior cervical decompression - 01/07/2012 7. HH with reflux - controlled with panprotozole 8. colonoscopy - Dr. Harl Delgado - Jan 2020  Social History: Husband Brenda Delgado. 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 Hysterectomy (not due to cancer) - Complete  Knee Surgery  Right. Shoulder Surgery  Right. Spinal Surgery - Neck   Diagnostic Studies History Brenda Pummel, RN; 07/30/2019 7:26 AM) Colonoscopy  within last year Mammogram  within last year Pap Smear  1-5 years ago  Medication History Brenda Pummel, RN; 07/30/2019 7:26 AM) Medications Reconciled  Social History Brenda Pummel, RN; 07/30/2019 7:26 AM) Alcohol use  Occasional alcohol use. Caffeine use  Carbonated beverages, Coffee, Tea. No drug use  Tobacco use  Never smoker.  Family History Brenda Pummel, RN; 07/30/2019 7:26 AM) Arthritis  Mother. Cerebrovascular Accident  Mother. Heart Disease  Father. Melanoma  Mother. Migraine Headache  Mother. Respiratory Condition  Father.  Pregnancy / Birth History Brenda Pummel, RN; 07/30/2019 7:26 AM) Age at menarche  71 years. Age of menopause  >61 Contraceptive History  Oral contraceptives. Gravida  2 Length (months) of breastfeeding  3-6 Maternal age  39-30 Para  2 Regular periods   Other Problems (  Brenda Pummel, RN; 07/30/2019 7:26 AM) Arthritis  Asthma  Back Pain  Bladder Problems  Gastroesophageal Reflux Disease  Hypercholesterolemia   Kidney Stone  Oophorectomy     Review of Systems Brenda Spillers [place] RN; 07/30/2019 7:26 AM) General Present- Weight Loss. Not Present- Appetite Loss, Chills, Fatigue, Fever, Night Sweats and Weight Gain. Skin Not Present- Change in Wart/Mole, Dryness, Hives, Jaundice, New Lesions, Non-Healing Wounds, Rash and Ulcer. HEENT Present- Hearing Loss, Seasonal Allergies and Wears glasses/contact lenses. Not Present- Earache, Hoarseness, Nose Bleed, Oral Ulcers, Ringing in the Ears, Sinus Pain, Sore Throat, Visual Disturbances and Yellow Eyes. Respiratory Not Present- Bloody sputum, Chronic Cough, Difficulty Breathing, Snoring and Wheezing. Breast Not Present- Breast Mass, Breast Pain, Nipple Discharge and Skin Changes. Cardiovascular Not Present- Chest Pain, Difficulty Breathing Lying Down, Leg Cramps, Palpitations, Rapid Heart Rate, Shortness of Breath and Swelling of Extremities. Gastrointestinal Not Present- Abdominal Pain, Bloating, Bloody Stool, Change in Bowel Habits, Chronic diarrhea, Constipation, Difficulty Swallowing, Excessive gas, Gets full quickly at meals, Hemorrhoids, Indigestion, Nausea, Rectal Pain and Vomiting. Female Genitourinary Present- Frequency. Not Present- Nocturia, Painful Urination, Pelvic Pain and Urgency. Musculoskeletal Present- Back Pain. Not Present- Joint Pain, Joint Stiffness, Muscle Pain, Muscle Weakness and Swelling of Extremities. Neurological Not Present- Decreased Memory, Fainting, Headaches, Numbness, Seizures, Tingling, Tremor, Trouble walking and Weakness. Psychiatric Not Present- Anxiety, Bipolar, Change in Sleep Pattern, Depression, Fearful and Frequent crying. Endocrine Not Present- Cold Intolerance, Excessive Hunger, Hair Changes, Heat Intolerance, Hot flashes and New Diabetes. Hematology Not Present- Blood Thinners, Easy Bruising, Excessive bleeding, Gland problems, HIV and Persistent Infections.   Physical Exam  General: Older thin woman who is alert  and generally healthy appearing. Delgado is wearing a mask. HEENT: Normal. Pupils equal.  Delgado pointed out what looks like a 3 mm seborheic lesion on her left nares  Neck: Supple. No mass. No thyroid mass. Lymph Nodes: No supraclavicular or cervical nodes.  Lungs: Clear to auscultation and symmetric breath sounds. Heart: RRR. No murmur or rub.  Breasts: Right - no mass or nodule  Left - Bruise at the 2 o'clock position with some mass effect. I feel nothing else in the breast  Abdomen: Soft. No mass. No tenderness. No hernia. Normal bowel sounds. Rectal: Not done.  Extremities: Good strength and ROM in upper and lower extremities.  Neurologic: Grossly intact to motor and sensory function. Psychiatric: Has normal mood and affect. Behavior is normal.  Assessment & Plan  1.  BREAST CANCER, STAGE 0, LEFT (D05.92)  Story: Left breast biopsy at 2 o'clock - 07/24/2019 [id number]) - DCIS, high grade, ER - 95%, PR - 90%  Oncology - Brenda Delgado and [place]  Plan:  1. Left breast lumpectomy (seed localization)   2. Radiation therapy  3. Antihormone treatment  2. Anterior cervical decompression - 01/07/2012 3. HH with reflux - controlled with panprotozole  Alphonsa Overall, MD, [facility] Surgery Office phone:  [phone]

## 2019-08-12 ENCOUNTER — Ambulatory Visit (HOSPITAL_BASED_OUTPATIENT_CLINIC_OR_DEPARTMENT_OTHER): Payer: Medicare Other | Admitting: Anesthesiology

## 2019-08-12 ENCOUNTER — Encounter (HOSPITAL_BASED_OUTPATIENT_CLINIC_OR_DEPARTMENT_OTHER): Payer: Self-pay | Admitting: Surgery

## 2019-08-12 ENCOUNTER — Other Ambulatory Visit: Payer: Self-pay

## 2019-08-12 ENCOUNTER — Encounter (HOSPITAL_BASED_OUTPATIENT_CLINIC_OR_DEPARTMENT_OTHER)
Admission: RE | Disposition: A | Discharge status: Home / Self Care | Payer: Self-pay | Source: Home / Self Care | Attending: Surgery

## 2019-08-12 ENCOUNTER — Ambulatory Visit (HOSPITAL_BASED_OUTPATIENT_CLINIC_OR_DEPARTMENT_OTHER)
Admission: RE | Admit: 2019-08-12 | Discharge: 2019-08-12 | Disposition: A | Discharge status: Home / Self Care | Payer: Medicare Other | Attending: Surgery | Admitting: Surgery

## 2019-08-12 DIAGNOSIS — F418 Other specified anxiety disorders: Secondary | ICD-10-CM | POA: Diagnosis not present

## 2019-08-12 DIAGNOSIS — M1990 Unspecified osteoarthritis, unspecified site: Secondary | ICD-10-CM | POA: Insufficient documentation

## 2019-08-12 DIAGNOSIS — Z8041 Family history of malignant neoplasm of ovary: Secondary | ICD-10-CM | POA: Insufficient documentation

## 2019-08-12 DIAGNOSIS — D0512 Intraductal carcinoma in situ of left breast: Secondary | ICD-10-CM | POA: Diagnosis not present

## 2019-08-12 DIAGNOSIS — C50412 Malignant neoplasm of upper-outer quadrant of left female breast: Secondary | ICD-10-CM | POA: Diagnosis not present

## 2019-08-12 DIAGNOSIS — K219 Gastro-esophageal reflux disease without esophagitis: Secondary | ICD-10-CM | POA: Diagnosis not present

## 2019-08-12 DIAGNOSIS — N6012 Diffuse cystic mastopathy of left breast: Secondary | ICD-10-CM | POA: Diagnosis not present

## 2019-08-12 DIAGNOSIS — D0511 Intraductal carcinoma in situ of right breast: Secondary | ICD-10-CM | POA: Diagnosis not present

## 2019-08-12 DIAGNOSIS — J45909 Unspecified asthma, uncomplicated: Secondary | ICD-10-CM | POA: Diagnosis not present

## 2019-08-12 DIAGNOSIS — C50912 Malignant neoplasm of unspecified site of left female breast: Secondary | ICD-10-CM | POA: Diagnosis not present

## 2019-08-12 HISTORY — DX: Personal history of urinary calculi: Z87.442

## 2019-08-12 HISTORY — DX: Gastro-esophageal reflux disease without esophagitis: K21.9

## 2019-08-12 HISTORY — DX: Anxiety disorder, unspecified: F41.9

## 2019-08-12 HISTORY — DX: Depression, unspecified: F32.A

## 2019-08-12 HISTORY — PX: BREAST LUMPECTOMY WITH RADIOACTIVE SEED LOCALIZATION: SHX6424

## 2019-08-12 SURGERY — BREAST LUMPECTOMY WITH RADIOACTIVE SEED LOCALIZATION
Anesthesia: General | Site: Breast | Laterality: Left

## 2019-08-12 MED ORDER — PHENYLEPHRINE HCL (PRESSORS) 10 MG/ML IV SOLN
INTRAVENOUS | Status: DC | PRN
  Administered 2019-08-12: 80 ug via INTRAVENOUS
  Administered 2019-08-12: 40 ug via INTRAVENOUS
  Administered 2019-08-12: 80 ug via INTRAVENOUS
  Administered 2019-08-12 (×2): 200 ug via INTRAVENOUS
  Administered 2019-08-12: 80 ug via INTRAVENOUS

## 2019-08-12 MED ORDER — PROPOFOL 10 MG/ML IV BOLUS
INTRAVENOUS | Status: DC | PRN
  Administered 2019-08-12: 30 mg via INTRAVENOUS
  Administered 2019-08-12: 90 mg via INTRAVENOUS

## 2019-08-12 MED ORDER — LACTATED RINGERS IV SOLN: INTRAVENOUS | Status: DC

## 2019-08-12 MED ORDER — LIDOCAINE 2% (20 MG/ML) 5 ML SYRINGE
INTRAMUSCULAR | Status: AC
  Filled 2019-08-12: qty 5

## 2019-08-12 MED ORDER — DEXAMETHASONE SODIUM PHOSPHATE 10 MG/ML IJ SOLN
INTRAMUSCULAR | Status: DC | PRN
  Administered 2019-08-12: 5 mg via INTRAVENOUS

## 2019-08-12 MED ORDER — ONDANSETRON HCL 4 MG/2ML IJ SOLN
INTRAMUSCULAR | Status: AC
  Filled 2019-08-12: qty 2

## 2019-08-12 MED ORDER — CHLORHEXIDINE GLUCONATE CLOTH 2 % EX PADS: 6.0000 | MEDICATED_PAD | Freq: Once | CUTANEOUS | Status: DC

## 2019-08-12 MED ORDER — OXYCODONE HCL 5 MG/5ML PO SOLN: 5.0000 mg | Freq: Once | ORAL | Status: DC | PRN

## 2019-08-12 MED ORDER — BUPIVACAINE HCL (PF) 0.25 % IJ SOLN
INTRAMUSCULAR | Status: DC | PRN
  Administered 2019-08-12: 30 mL

## 2019-08-12 MED ORDER — OXYCODONE HCL 5 MG PO TABS: 5.0000 mg | ORAL_TABLET | Freq: Once | ORAL | Status: DC | PRN

## 2019-08-12 MED ORDER — ACETAMINOPHEN 500 MG PO TABS: 1000.0000 mg | ORAL_TABLET | Freq: Once | ORAL | Status: DC

## 2019-08-12 MED ORDER — DEXAMETHASONE SODIUM PHOSPHATE 10 MG/ML IJ SOLN
INTRAMUSCULAR | Status: AC
  Filled 2019-08-12: qty 1

## 2019-08-12 MED ORDER — PROPOFOL 10 MG/ML IV BOLUS
INTRAVENOUS | Status: AC
  Filled 2019-08-12: qty 20

## 2019-08-12 MED ORDER — EPHEDRINE SULFATE 50 MG/ML IJ SOLN
INTRAMUSCULAR | Status: DC | PRN
  Administered 2019-08-12: 15 mg via INTRAVENOUS

## 2019-08-12 MED ORDER — ACETAMINOPHEN 500 MG PO TABS
1000.0000 mg | ORAL_TABLET | ORAL | Status: AC
  Administered 2019-08-12: 1000 mg via ORAL

## 2019-08-12 MED ORDER — CEFAZOLIN SODIUM-DEXTROSE 2-4 GM/100ML-% IV SOLN
INTRAVENOUS | Status: AC
  Filled 2019-08-12: qty 100

## 2019-08-12 MED ORDER — FENTANYL CITRATE (PF) 100 MCG/2ML IJ SOLN
INTRAMUSCULAR | Status: DC | PRN
  Administered 2019-08-12: 25 ug via INTRAVENOUS
  Administered 2019-08-12: 50 ug via INTRAVENOUS
  Administered 2019-08-12: 25 ug via INTRAVENOUS

## 2019-08-12 MED ORDER — FENTANYL CITRATE (PF) 100 MCG/2ML IJ SOLN: 25.0000 ug | INTRAMUSCULAR | Status: DC | PRN

## 2019-08-12 MED ORDER — FENTANYL CITRATE (PF) 100 MCG/2ML IJ SOLN
INTRAMUSCULAR | Status: AC
  Filled 2019-08-12: qty 2

## 2019-08-12 MED ORDER — TRAMADOL HCL 50 MG PO TABS: 50.0000 mg | ORAL_TABLET | Freq: Four times a day (QID) | ORAL | 0 refills | Status: DC | PRN

## 2019-08-12 MED ORDER — MIDAZOLAM HCL 2 MG/2ML IJ SOLN: 1.0000 mg | INTRAMUSCULAR | Status: DC | PRN

## 2019-08-12 MED ORDER — LIDOCAINE HCL (CARDIAC) PF 100 MG/5ML IV SOSY
PREFILLED_SYRINGE | INTRAVENOUS | Status: DC | PRN
  Administered 2019-08-12: 40 mg via INTRAVENOUS
  Administered 2019-08-12: 20 mg via INTRAVENOUS

## 2019-08-12 MED ORDER — ACETAMINOPHEN 500 MG PO TABS
ORAL_TABLET | ORAL | Status: AC
  Filled 2019-08-12: qty 2

## 2019-08-12 MED ORDER — CEFAZOLIN SODIUM-DEXTROSE 2-4 GM/100ML-% IV SOLN: 2.0000 g | INTRAVENOUS | Status: DC

## 2019-08-12 MED ORDER — LACTATED RINGERS IV SOLN: INTRAVENOUS | Status: DC | PRN

## 2019-08-12 MED ORDER — ONDANSETRON HCL 4 MG/2ML IJ SOLN
INTRAMUSCULAR | Status: DC | PRN
  Administered 2019-08-12: 4 mg via INTRAVENOUS

## 2019-08-12 MED ORDER — FENTANYL CITRATE (PF) 100 MCG/2ML IJ SOLN: 50.0000 ug | INTRAMUSCULAR | Status: DC | PRN

## 2019-08-12 MED ORDER — SODIUM CHLORIDE 0.9 % IV SOLN
INTRAVENOUS | Status: DC | PRN
  Administered 2019-08-12: 50 ug/min via INTRAVENOUS

## 2019-08-12 MED ORDER — PROMETHAZINE HCL 25 MG/ML IJ SOLN: 6.2500 mg | INTRAMUSCULAR | Status: DC | PRN

## 2019-08-12 SURGICAL SUPPLY — 55 items
ADH SKN CLS APL DERMABOND .7 (GAUZE/BANDAGES/DRESSINGS) ×1
APL PRP STRL LF DISP 70% ISPRP (MISCELLANEOUS) ×1
APL SKNCLS STERI-STRIP NONHPOA (GAUZE/BANDAGES/DRESSINGS)
BENZOIN TINCTURE PRP APPL 2/3 (GAUZE/BANDAGES/DRESSINGS) IMPLANT
BINDER BREAST LRG (GAUZE/BANDAGES/DRESSINGS) ×1 IMPLANT
BINDER BREAST MEDIUM (GAUZE/BANDAGES/DRESSINGS) IMPLANT
BINDER BREAST XLRG (GAUZE/BANDAGES/DRESSINGS) IMPLANT
BINDER BREAST XXLRG (GAUZE/BANDAGES/DRESSINGS) IMPLANT
BLADE HEX COATED 2.75 (ELECTRODE) IMPLANT
BLADE SURG 10 STRL SS (BLADE) IMPLANT
BLADE SURG 15 STRL LF DISP TIS (BLADE) ×1 IMPLANT
BLADE SURG 15 STRL SS (BLADE) ×2
CANISTER SUC SOCK COL 7IN (MISCELLANEOUS) IMPLANT
CANISTER SUCT 1200ML W/VALVE (MISCELLANEOUS) ×2 IMPLANT
CHLORAPREP W/TINT 26 (MISCELLANEOUS) ×2 IMPLANT
CLIP VESOCCLUDE SM WIDE 6/CT (CLIP) IMPLANT
COVER BACK TABLE REUSABLE LG (DRAPES) ×2 IMPLANT
COVER MAYO STAND REUSABLE (DRAPES) ×2 IMPLANT
COVER PROBE W GEL 5X96 (DRAPES) ×2 IMPLANT
COVER WAND RF STERILE (DRAPES) IMPLANT
DECANTER SPIKE VIAL GLASS SM (MISCELLANEOUS) IMPLANT
DERMABOND ADVANCED (GAUZE/BANDAGES/DRESSINGS) ×1
DERMABOND ADVANCED .7 DNX12 (GAUZE/BANDAGES/DRESSINGS) ×1 IMPLANT
DRAPE HALF SHEET 70X43 (DRAPES) IMPLANT
DRAPE LAPAROSCOPIC ABDOMINAL (DRAPES) ×2 IMPLANT
DRAPE UTILITY XL STRL (DRAPES) ×2 IMPLANT
DRSG PAD ABDOMINAL 8X10 ST (GAUZE/BANDAGES/DRESSINGS) IMPLANT
ELECT COATED BLADE 2.86 ST (ELECTRODE) ×2 IMPLANT
ELECT REM PT RETURN 9FT ADLT (ELECTROSURGICAL) ×2
ELECTRODE REM PT RTRN 9FT ADLT (ELECTROSURGICAL) ×1 IMPLANT
GAUZE SPONGE 4X4 12PLY STRL (GAUZE/BANDAGES/DRESSINGS) ×2 IMPLANT
GAUZE SPONGE 4X4 12PLY STRL LF (GAUZE/BANDAGES/DRESSINGS) IMPLANT
GLOVE SURG SYN 7.5  E (GLOVE) ×1
GLOVE SURG SYN 7.5 E (GLOVE) ×1 IMPLANT
GLOVE SURG SYN 7.5 PF PI (GLOVE) ×1 IMPLANT
GOWN STRL REUS W/ TWL LRG LVL3 (GOWN DISPOSABLE) ×1 IMPLANT
GOWN STRL REUS W/ TWL XL LVL3 (GOWN DISPOSABLE) ×1 IMPLANT
GOWN STRL REUS W/TWL LRG LVL3 (GOWN DISPOSABLE) ×2
GOWN STRL REUS W/TWL XL LVL3 (GOWN DISPOSABLE) ×2
KIT MARKER MARGIN INK (KITS) ×2 IMPLANT
NDL HYPO 25X1 1.5 SAFETY (NEEDLE) ×1 IMPLANT
NEEDLE HYPO 25X1 1.5 SAFETY (NEEDLE) ×2 IMPLANT
NS IRRIG 1000ML POUR BTL (IV SOLUTION) IMPLANT
PACK BASIN DAY SURGERY FS (CUSTOM PROCEDURE TRAY) ×2 IMPLANT
PENCIL SMOKE EVACUATOR (MISCELLANEOUS) ×2 IMPLANT
SLEEVE SCD COMPRESS KNEE MED (MISCELLANEOUS) ×2 IMPLANT
SPONGE LAP 18X18 RF (DISPOSABLE) ×2 IMPLANT
STRIP CLOSURE SKIN 1/4X4 (GAUZE/BANDAGES/DRESSINGS) IMPLANT
SUT MNCRL AB 4-0 PS2 18 (SUTURE) ×2 IMPLANT
SUT VICRYL 3-0 CR8 SH (SUTURE) ×2 IMPLANT
SYR CONTROL 10ML LL (SYRINGE) ×2 IMPLANT
TOWEL GREEN STERILE FF (TOWEL DISPOSABLE) ×2 IMPLANT
TRAY FAXITRON CT DISP (TRAY / TRAY PROCEDURE) ×2 IMPLANT
TUBE CONNECTING 20X1/4 (TUBING) ×2 IMPLANT
YANKAUER SUCT BULB TIP NO VENT (SUCTIONS) ×2 IMPLANT

## 2019-08-12 NOTE — Discharge Instructions (Signed)
  Post Anesthesia Home Care Instructions  Activity: Get plenty of rest for the remainder of the day. A responsible individual must stay with you for 24 hours following the procedure.  For the next 24 hours, DO NOT: -Drive a car -Paediatric nurse -Drink alcoholic beverages -Take any medication unless instructed by your physician -Make any legal decisions or sign important papers.  Meals: Start with liquid foods such as gelatin or soup. Progress to regular foods as tolerated. Avoid greasy, spicy, heavy foods. If nausea and/or vomiting occur, drink only clear liquids until the nausea and/or vomiting subsides. Call your physician if vomiting continues.  Special Instructions/Symptoms: Your throat may feel dry or sore from the anesthesia or the breathing tube placed in your throat during surgery. If this causes discomfort, gargle with warm salt water. The discomfort should disappear within 24 hours.  If you had a scopolamine patch placed behind your ear for the management of post- operative nausea and/or vomiting:  1. The medication in the patch is effective for 72 hours, after which it should be removed.  Wrap patch in a tissue and discard in the trash. Wash hands thoroughly with soap and water. 2. You may remove the patch earlier than 72 hours if you experience unpleasant side effects which may include dry mouth, dizziness or visual disturbances. 3. Avoid touching the patch. Wash your hands with soap and water after contact with the patch.    [facility] [place] [facility] - DISCHARGE INSTRUCTIONS TO PATIENT  Activity:  Driving - May drive in one to 2 days   Lifting - No lifting more than 15 pounds for 5 days, then no limit                       Practice your Covid-19 protection:  Wear a mask, social distance, and wash your hands frequently  Wound Care:   Leave the incision dry for 2 days, then you may shower  Diet:  As tolerated  Follow up appointment:  Call Dr. Pollie Friar office [facility]) at [phone] for an appointment in 2 to 3 weeks..  Medications and dosages:  Resume your home medications.  You have a prescription for:  Ultram  Call Dr. [name] or his office  [phone]) if you have:  Temperature greater than 100.4,  Persistent nausea and vomiting,  Severe uncontrolled pain,  Redness, tenderness, or signs of infection (pain, swelling, redness, odor or green/yellow discharge around the site),  Difficulty breathing, headache or visual disturbances,  Any other questions or concerns you may have after discharge.  In an emergency, call 911 or go to an Emergency Department at a nearby hospital.

## 2019-08-12 NOTE — Transfer of Care (Signed)
Immediate Anesthesia Transfer of Care Note  Patient: Brenda Delgado  Procedure(s) Performed: LEFT BREAST LUMPECTOMY WITH RADIOACTIVE SEED LOCALIZATION (Left Breast)  Patient Location: PACU  Anesthesia Type:General  Level of Consciousness: awake, alert  and oriented  Airway & Oxygen Therapy: Patient Spontanous Breathing and Patient connected to face mask oxygen  Post-op Assessment: Report given to RN and Post -op Vital signs reviewed and stable  Post vital signs: Reviewed and stable  Last Vitals:  Vitals Value Taken Time  BP 141/77 08/12/19 1110  Temp    Pulse 122 08/12/19 1111  Resp 25 08/12/19 1111  SpO2 94 % 08/12/19 1111  Vitals shown include unvalidated device data.  Last Pain:  Vitals:   08/12/19 0758  TempSrc: Temporal  PainSc: 0-No pain      Patients Stated Pain Goal: 0 ([id number])  Complications: No apparent anesthesia complications

## 2019-08-12 NOTE — Anesthesia Procedure Notes (Signed)
Procedure Name: LMA Insertion Performed by: Sophiah Rolin M, CRNA Pre-anesthesia Checklist: Patient identified, Emergency Drugs available, Suction available and Patient being monitored Patient Re-evaluated:Patient Re-evaluated prior to induction Oxygen Delivery Method: Circle system utilized Preoxygenation: Pre-oxygenation with 100% oxygen Induction Type: IV induction Ventilation: Mask ventilation without difficulty LMA: LMA inserted LMA Size: 4.0 Tube type: Oral Number of attempts: 1 Airway Equipment and Method: Bite block Placement Confirmation: positive ETCO2,  CO2 detector and breath sounds checked- equal and bilateral Tube secured with: Tape Dental Injury: Teeth and Oropharynx as per pre-operative assessment        

## 2019-08-12 NOTE — Anesthesia Postprocedure Evaluation (Signed)
Anesthesia Post Note  Patient: Brenda Delgado  Procedure(s) Performed: LEFT BREAST LUMPECTOMY WITH RADIOACTIVE SEED LOCALIZATION (Left Breast)     Patient location during evaluation: PACU Anesthesia Type: General Level of consciousness: awake Pain management: pain level controlled Vital Signs Assessment: post-procedure vital signs reviewed and stable Respiratory status: spontaneous breathing Cardiovascular status: stable Postop Assessment: no apparent nausea or vomiting Anesthetic complications: no    Last Vitals:  Vitals:   08/12/19 1115 08/12/19 1130  BP: (!) 149/68 135/68  Pulse: (!) 111 (!) 104  Resp: 19 17  Temp:    SpO2: 97% 94%    Last Pain:  Vitals:   08/12/19 1130  TempSrc:   PainSc: 3    Pain Goal: Patients Stated Pain Goal: 0 (08/12/19 0758)                 Huston Foley

## 2019-08-12 NOTE — Op Note (Addendum)
08/12/2019  11:05 AM  PATIENT:  Brenda Delgado DOB: [birth year] MRN: [id number]  PREOP DIAGNOSIS:   LEFT BREAST CANCER  POSTOP DIAGNOSIS:    Left breast cancer, 2 o'clock position (Tis, N0)  PROCEDURE:   Procedure(s):  LEFT BREAST LUMPECTOMY WITH RADIOACTIVE SEED LOCALIZATION  SURGEON:   Alphonsa Overall, M.D.  ANESTHESIA:   General  Anesthesiologist: Brennan Bailey, MD CRNA: Verita Lamb, CRNA; Willa Frater, CRNA  General  EBL:  minimal  ml  DRAINS:  none   LOCAL MEDICATIONS USED:   30 cc 1/4% marcaine  SPECIMEN:   Left breast lumpectomy ( 6 color paint), medial margin, superior margin, and lateral margin  COUNTS CORRECT:  YES  INDICATIONS FOR PROCEDURE:  Brenda Delgado is a 74 y.o. (DOB: September 29, 1945) white female whose primary care physician is Libby Maw, MD and comes for left breast lumpectomy.   She was seen at the [facility] with Drs. Philis Nettle for a left breast DCIS.   The options for breast cancer treatment have been discussed with the patient. She elected to proceed with lumpectomy and axillary sentinel lymph node.     The indications and potential complications of surgery were explained to the patient. Potential complications include, but are not limited to, bleeding, infection, the need for further surgery, and nerve injury.     She had a I131 seed placed on 08/11/2019 in her left breast at [facility].  The seed is in the 2 o'clock position of the left breast.     OPERATIVE NOTE:   The patient was taken to operating room # 1 at [facility] where she underwent a general anesthesia  supervised by Anesthesiologist: Brennan Bailey, MD CRNA: Verita Lamb, CRNA; Willa Frater, CRNA. Her left breast and axilla were prepped with  ChloraPrep and sterilely draped.    A time-out was held and the surgical check list was reviewed.    The cancer was about at the 2 o'clock position of the left breast.   It was 6 cm from the  areola.  I used the Neoprobe to identify the I131 seed.  I tried to excise an area around the tumor of at least 1 cm.    I excised this block of breast tissue approximately 4 cm by 4 cm  in diameter.   I painted the lumpectomy specimen with the 6 color paint kit and did a specimen mammogram which confirmed the mass, clip, and the seed were all in the right position in the specimen.  The specimen was sent to pathology who called back to confirm that they have the seed and the specimen.   I did a specimen mammogram.  The faxitron was not working initially and it added time, but finally the Sarpy worked to visualize the specimen. The tumor was thought to be near the medial and superior margins, so I took additional margins medially and superiorly.  Also laterally, though this did not show up on the specimen mammogram, there were some changes that I included in an additional lateral margin.   I then irrigated the wound with saline. I infiltrated approximately 30 mL of 1/4% Marcaine between the incisions. I placed 4 clips to mark biopsy cavity, at 12, 3, 6, and 9 o'clock.  I then closed the wound in layers using 3-0 Vicryl sutures for the deep layer. At the skin, I closed the incision with a 4-0 Monocryl suture. The incision was then painted with  Dermabond.  She had gauze place over the wound and placed in a breast binder.   The patient tolerated the procedure well, was transported to the recovery room in good condition. Sponge and needle count were correct at the end of the case.   Final pathology is pending.   Note, she did have a period of hypotension during the operation.    Left lumpectomy specimen mammogram - note calcs medially in specimen   Alphonsa Overall, MD, [facility] Pager: [phone] Office phone:  [phone]

## 2019-08-12 NOTE — Interval H&P Note (Signed)
History and Physical Interval Note:  08/12/2019 9:09 AM  Brenda Delgado  has presented today for surgery, with the diagnosis of LEFT BREAST CANCER.  The various methods of treatment have been discussed with the patient and family.  The seed is in place.  Her husband is with her.  After consideration of risks, benefits and other options for treatment, the patient has consented to  Procedure(s): LEFT BREAST LUMPECTOMY WITH RADIOACTIVE SEED LOCALIZATION (Left) as a surgical intervention.  The patient's history has been reviewed, patient examined, no change in status, stable for surgery.  I have reviewed the patient's chart and labs.  Questions were answered to the patient's satisfaction.     [name]

## 2019-08-13 ENCOUNTER — Telehealth: Payer: Self-pay | Admitting: Licensed Clinical Social Worker

## 2019-08-13 ENCOUNTER — Ambulatory Visit: Payer: Self-pay | Admitting: Licensed Clinical Social Worker

## 2019-08-13 ENCOUNTER — Encounter: Payer: Self-pay | Admitting: *Deleted

## 2019-08-13 ENCOUNTER — Encounter: Payer: Self-pay | Admitting: Licensed Clinical Social Worker

## 2019-08-13 DIAGNOSIS — D0512 Intraductal carcinoma in situ of left breast: Secondary | ICD-10-CM

## 2019-08-13 DIAGNOSIS — Z8041 Family history of malignant neoplasm of ovary: Secondary | ICD-10-CM

## 2019-08-13 DIAGNOSIS — Z0000 Encounter for general adult medical examination without abnormal findings: Secondary | ICD-10-CM | POA: Insufficient documentation

## 2019-08-13 DIAGNOSIS — Z1379 Encounter for other screening for genetic and chromosomal anomalies: Secondary | ICD-10-CM

## 2019-08-13 DIAGNOSIS — Z800 Family history of malignant neoplasm of digestive organs: Secondary | ICD-10-CM

## 2019-08-13 NOTE — Telephone Encounter (Signed)
Revealed negative genetic testing.  Revealed that a VUS in MSH2 was identified.  We discussed that we do not know why she has breast cancer or why there is cancer in the family. It could be due to a different gene that we are not testing, or something our current technology cannot pick up.  It will be important for her to keep in contact with genetics to learn if additional testing may be needed in the future.

## 2019-08-13 NOTE — Progress Notes (Signed)
HPI:  [name] was previously seen in the [facility] clinic due to a personal and family history of cancer and concerns regarding a hereditary predisposition to cancer. Please refer to our prior cancer genetics clinic note for more information regarding our discussion, assessment and recommendations, at the time. [name] recent genetic test results were disclosed to her, as were recommendations warranted by these results. These results and recommendations are discussed in more detail below.  CANCER HISTORY:  Oncology History  Ductal carcinoma in situ (DCIS) of left breast  07/28/2019 Initial Diagnosis   Routine screening mammogram detected a 1.9cm group of calcifications in the left breast. Biopsy showed DCIS, high grade, ER+ 95%, PR+ 90%.   07/30/2019 Cancer Staging   Staging form: Breast, AJCC 8th Edition - Clinical: Stage 0 (cTis (DCIS), cN0, cM0, G3, ER+, PR+, HER2: Not Assessed) - Signed by Nicholas Lose, MD on 07/30/2019    Genetic Testing   Negative genetic testing. No pathogenic variants identified, VUS in MSH2 Gain (Exons 11-16) identified on the [facility] Common Hereditary Cancers Panel + Breast Cancer STAT Panel. The STAT Breast cancer panel offered by [facility] includes sequencing and rearrangement analysis for the following 9 genes:  ATM, BRCA1, BRCA2, CDH1, CHEK2, PALB2, PTEN, STK11 and TP53.  The Common Hereditary Cancers Panel offered by [facility] includes sequencing and/or deletion duplication testing of the following 47 genes: APC, ATM, AXIN2, BARD1, BMPR1A, BRCA1, BRCA2, BRIP1, CDH1, CDKN2A (p14ARF), CDKN2A (p16INK4a), CKD4, CHEK2, CTNNA1, DICER1, EPCAM (Deletion/duplication testing only), GREM1 (promoter region deletion/duplication testing only), KIT, MEN1, MLH1, MSH2, MSH3, MSH6, MUTYH, NBN, NF1, NHTL1, PALB2, PDGFRA, PMS2, POLD1, POLE, PTEN, RAD50, RAD51C, RAD51D, SDHB, SDHC, SDHD, SMAD4, SMARCA4. STK11, TP53, TSC1, TSC2, and VHL.  The following genes were evaluated  for sequence changes only: SDHA and HOXB13 c.251G>A variant only. The report date is 08/12/2019.     FAMILY HISTORY:  We obtained a detailed, 4-generation family history.  Significant diagnoses are listed below: Family History  Problem Relation Age of Onset  . Skin cancer Mother   . Colon cancer Paternal Uncle        dx late 83s-60s  . Ovarian cancer Paternal Aunt 59  . Cancer Cousin        unk type   [name] has one son, 79, and one daughter, 53, no history of cancer. Patient has 1 brother living at 49 with no cancer history.  [name] mother died at 65. She had history of skin cancer. Patient had 2 maternal uncles, no cancers. One female ,maternal cousin is currently going through chemotherapy but patient is unsure the cancer type. Another maternal cousin may have had cancer. Maternal grandmother died at 60, grandfather died at 80.  [name] father died at 33, no cancer. Patient had 2 paternal aunts, 2 paternal uncles. One of her aunts had ovarian cancer in her 34s, patient has limited information about her. An uncle had colon cancer in his late 79s-60s. No known cancers in paternal cousins. Paternal grandparents are deceased, no known cancers.  [name] is unaware of previous family history of genetic testing for hereditary cancer risks. Patient's maternal ancestors are of [place] descent, and paternal ancestors are of [place] descent. There is no reported Ashkenazi Jewish ancestry. There is no known consanguinity.  GENETIC TEST RESULTS: Genetic testing reported out on 08/12/2019 through the [facility] Breast Cancer Panel + Common Hereditary cancer panel found no pathogenic mutations. The STAT Breast cancer panel offered by [facility] includes  sequencing and rearrangement analysis for the following 9 genes:  ATM, BRCA1, BRCA2, CDH1, CHEK2, PALB2, PTEN, STK11 and TP53.  The Common Hereditary Cancers Panel offered by [facility] includes sequencing and/or deletion duplication testing  of the following 47 genes: APC, ATM, AXIN2, BARD1, BMPR1A, BRCA1, BRCA2, BRIP1, CDH1, CDKN2A (p14ARF), CDKN2A (p16INK4a), CKD4, CHEK2, CTNNA1, DICER1, EPCAM (Deletion/duplication testing only), GREM1 (promoter region deletion/duplication testing only), KIT, MEN1, MLH1, MSH2, MSH3, MSH6, MUTYH, NBN, NF1, NHTL1, PALB2, PDGFRA, PMS2, POLD1, POLE, PTEN, RAD50, RAD51C, RAD51D, SDHB, SDHC, SDHD, SMAD4, SMARCA4. STK11, TP53, TSC1, TSC2, and VHL.  The following genes were evaluated for sequence changes only: SDHA and HOXB13 c.251G>A variant only.. The test report has been scanned into EPIC and is located under the Molecular Pathology section of the Results Review tab.  A portion of the result report is included below for reference.     We discussed with [name] that because current genetic testing is not perfect, it is possible there may be a gene mutation in one of these genes that current testing cannot detect, but that chance is small.  We also discussed, that there could be another gene that has not yet been discovered, or that we have not yet tested, that is responsible for the cancer diagnoses in the family. It is also possible there is a hereditary cause for the cancer in the family that [name] did not inherit and therefore was not identified in her testing.  Therefore, it is important to remain in touch with cancer genetics in the future so that we can continue to offer [name] the most up to date genetic testing.   Genetic testing did identify a variant of uncertain significance (VUS) was identified in the MSH2 gene called Gain (Exons 11-16).  At this time, it is unknown if this variant is associated with increased cancer risk or if this is a normal finding, but most variants such as this get reclassified to being inconsequential. It should not be used to make medical management decisions. With time, we suspect the lab will determine the significance of this variant, if any. If we do learn more about  it, we will try to contact [name] to discuss it further. However, it is important to stay in touch with [place] periodically and keep the address and phone number up to date.  ADDITIONAL GENETIC TESTING: We discussed with [name] that her genetic testing was fairly extensive.  If there are genes identified to increase cancer risk that can be analyzed in the future, we would be happy to discuss and coordinate this testing at that time.    CANCER SCREENING RECOMMENDATIONS: [name] test result is considered negative (normal).  This means that we have not identified a hereditary cause for her  personal and family history of cancer at this time. Most cancers happen by chance and this negative test suggests that her cancer may fall into this category.    While reassuring, this does not definitively rule out a hereditary predisposition to cancer. It is still possible that there could be genetic mutations that are undetectable by current technology. There could be genetic mutations in genes that have not been tested or identified to increase cancer risk.  Therefore, it is recommended she continue to follow the cancer management and screening guidelines provided by her oncology and primary healthcare provider.   An individual's cancer risk and medical management are not determined by genetic test results alone. Overall cancer risk assessment incorporates additional factors,  including personal medical history, family history, and any available genetic information that may result in a personalized plan for cancer prevention and surveillance.  RECOMMENDATIONS FOR FAMILY MEMBERS:  Relatives in this family might be at some increased risk of developing cancer, over the general population risk, simply due to the family history of cancer.  We recommended female relatives in this family have a yearly mammogram beginning at age 16, or 85 years younger than the earliest onset of cancer, an annual clinical breast exam, and  perform monthly breast self-exams. Female relatives in this family should also have a gynecological exam as recommended by their primary provider. All family members should have a colonoscopy by age 28, or as directed by their physicians.   It is also possible there is a hereditary cause for the cancer in Ms. Brookshire's family that she did not inherit and therefore was not identified in her.  Based on [name]'s family history, we recommended paternal relatives have genetic counseling and testing. [name] will let us know if we can be of any assistance in coordinating genetic counseling and/or testing for these family members.  FOLLOW-UP: Lastly, we discussed with [name] that cancer genetics is a rapidly advancing field and it is possible that new genetic tests will be appropriate for her and/or her family members in the future. We encouraged her to remain in contact with cancer genetics on an annual basis so we can update her personal and family histories and let her know of advances in cancer genetics that may benefit this family.   Our contact number was provided. [name] questions were answered to her satisfaction, and she knows she is welcome to call us at anytime with additional questions or concerns.   Faith Rogue, MS, [facility] Genetic Counselor [email or web address] Phone: [phone]

## 2019-08-14 ENCOUNTER — Encounter: Payer: Self-pay | Admitting: *Deleted

## 2019-08-14 LAB — SURGICAL PATHOLOGY

## 2019-08-20 NOTE — Progress Notes (Signed)
Patient Care Team: Libby Maw, MD as PCP - General (Family Medicine) Mauro Kaufmann, RN as Oncology Nurse Navigator Rockwell Germany, RN as Oncology Nurse Navigator Alphonsa Overall, MD as Consulting Physician (General Surgery) Nicholas Lose, MD as Consulting Physician (Hematology and Oncology) Eppie Gibson, MD as Attending Physician (Radiation Oncology)  DIAGNOSIS:    ICD-10-CM   1. Ductal carcinoma in situ (DCIS) of left breast  D05.12     SUMMARY OF ONCOLOGIC HISTORY: Oncology History  Ductal carcinoma in situ (DCIS) of left breast  07/28/2019 Initial Diagnosis   Routine screening mammogram detected a 1.9cm group of calcifications in the left breast. Biopsy showed DCIS, high grade, ER+ 95%, PR+ 90%.   07/30/2019 Cancer Staging   Staging form: Breast, AJCC 8th Edition - Clinical: Stage 0 (cTis (DCIS), cN0, cM0, G3, ER+, PR+, HER2: Not Assessed) - Signed by Nicholas Lose, MD on 07/30/2019    Genetic Testing   Negative genetic testing. No pathogenic variants identified, VUS in MSH2 Gain (Exons 11-16) identified on the [facility] Common Hereditary Cancers Panel + Breast Cancer STAT Panel. The STAT Breast cancer panel offered by [facility] includes sequencing and rearrangement analysis for the following 9 genes:  ATM, BRCA1, BRCA2, CDH1, CHEK2, PALB2, PTEN, STK11 and TP53.  The Common Hereditary Cancers Panel offered by [facility] includes sequencing and/or deletion duplication testing of the following 47 genes: APC, ATM, AXIN2, BARD1, BMPR1A, BRCA1, BRCA2, BRIP1, CDH1, CDKN2A (p14ARF), CDKN2A (p16INK4a), CKD4, CHEK2, CTNNA1, DICER1, EPCAM (Deletion/duplication testing only), GREM1 (promoter region deletion/duplication testing only), KIT, MEN1, MLH1, MSH2, MSH3, MSH6, MUTYH, NBN, NF1, NHTL1, PALB2, PDGFRA, PMS2, POLD1, POLE, PTEN, RAD50, RAD51C, RAD51D, SDHB, SDHC, SDHD, SMAD4, SMARCA4. STK11, TP53, TSC1, TSC2, and VHL.  The following genes were evaluated for sequence changes only: SDHA  and HOXB13 c.251G>A variant only. The report date is 08/12/2019.     CHIEF COMPLIANT: Follow-up s/p lumpectomy to review pathology  INTERVAL HISTORY: Brenda Delgado is a 74 y.o. with above-mentioned history of left breast DCIS. She underwent a lumpectomy on 08/12/19 with Dr. [name] for which pathology confirmed high grade DCIS with calcifications and necrosis, 1.5cm, clear margins. She presents to the clinic today to review the pathology report and discuss further treatment.   ALLERGIES:  is allergic to pravastatin; erythromycin; iohexol; amoxicillin-pot clavulanate; codeine; moxifloxacin; and sulfamethoxazole.  MEDICATIONS:  Current Outpatient Medications  Medication Sig Dispense Refill  . ALBUTEROL IN Inhale into the lungs as needed.    Marland Kitchen aspirin EC 81 MG tablet Take 81 mg by mouth daily.    . Calcium-Vitamin D-Vitamin K 500-500-40 MG-UNT-MCG CHEW Chew 2 tablets by mouth daily.    . Cholecalciferol (VITAMIN D3) 50 MCG (2000 UT) TABS Take 50 mcg by mouth 2 (two) times daily.    . Coenzyme Q10 (COQ10 PO) Take 1 capsule by mouth daily.    Marland Kitchen EPINEPHrine 0.3 mg/0.3 mL IJ SOAJ injection SMARTSIG:1 Pre-Filled Pen Syringe IM As Needed    . pantoprazole (PROTONIX) 40 MG tablet Take 40 mg by mouth at bedtime.     Marland Kitchen PARoxetine (PAXIL) 20 MG tablet Take 20 mg by mouth daily.    . traMADol (ULTRAM) 50 MG tablet Take 1 tablet (50 mg total) by mouth every 6 (six) hours as needed for moderate pain or severe pain. 12 tablet 0  . zinc gluconate 50 MG tablet Take 50 mg by mouth daily.     No current facility-administered medications for this visit.    PHYSICAL EXAMINATION: ECOG PERFORMANCE  STATUS: 1 - Symptomatic but completely ambulatory  There were no vitals filed for this visit. There were no vitals filed for this visit.  LABORATORY DATA:  I have reviewed the data as listed CMP Latest Ref Rng & Units 07/30/2019 07/22/2019 05/28/2019  Glucose 70 - 99 mg/dL 97 98 80  BUN 8 - 23 mg/dL '16 13 18    ' Creatinine 0.44 - 1.00 mg/dL 0.67 0.61 0.60  Sodium 135 - 145 mmol/L 143 143 140  Potassium 3.5 - 5.1 mmol/L 4.4 4.8 4.0  Chloride 98 - 111 mmol/L 104 105 101  CO2 22 - 32 mmol/L 30 31 -  Calcium 8.9 - 10.3 mg/dL 8.9 9.5 -  Total Protein 6.5 - 8.1 g/dL 6.5 6.4 -  Total Bilirubin 0.3 - 1.2 mg/dL 0.4 0.5 -  Alkaline Phos 38 - 126 U/L 72 67 -  AST 15 - 41 U/L 18 17 -  ALT 0 - 44 U/L 12 12 -    Lab Results  Component Value Date   WBC 4.0 07/30/2019   HGB 13.9 07/30/2019   HCT 43.9 07/30/2019   MCV 92.2 07/30/2019   PLT 305 07/30/2019   NEUTROABS 1.7 07/30/2019    ASSESSMENT & PLAN:  Ductal carcinoma in situ (DCIS) of left breast 07/28/2019:Routine screening mammogram detected a 1.9cm group of calcifications in the left breast. Biopsy showed DCIS, high grade, ER+ 95%, PR+ 90%. Tis NX stage 0 08/12/2019: Left lumpectomy: High-grade DCIS with necrosis and calcifications 1.5 cm, margins negative, ER 95%, PR 90%  Pathology counseling: I discussed the final pathology report of the patient provided  a copy of this report. I discussed the margins . We also discussed the final staging along with previously performed ER/PR testing.  Treatment plan: 1.  Adjuvant radiation therapy 2.  Follow-up adjuvant antiestrogen therapy with tamoxifen x5 years I discussed with the patient that tamoxifen is considered optional.  We discussed the risks and benefits once again.  I provided them with more literature to read.  We may start them at 10 mg dose and titrated down if necessary.  Return to clinic at the end of radiation to finalize on the adjuvant antiestrogen plan.    No orders of the defined types were placed in this encounter.  The patient has a good understanding of the overall plan. she agrees with it. she will call with any problems that may develop before the next visit here.  Total time spent: 30 mins including face to face time and time spent for planning, charting and coordination of  care  Nicholas Lose, MD 08/21/2019  I, Cloyde Reams Dorshimer, am acting as scribe for Dr. Nicholas Lose.  I have reviewed the above documentation for accuracy and completeness, and I agree with the above.

## 2019-08-21 ENCOUNTER — Inpatient Hospital Stay: Payer: Medicare Other | Attending: Hematology and Oncology | Admitting: Hematology and Oncology

## 2019-08-21 ENCOUNTER — Other Ambulatory Visit: Payer: Self-pay

## 2019-08-21 DIAGNOSIS — D0512 Intraductal carcinoma in situ of left breast: Secondary | ICD-10-CM | POA: Diagnosis not present

## 2019-08-21 DIAGNOSIS — Z170 Estrogen receptor positive status [ER+]: Secondary | ICD-10-CM | POA: Diagnosis not present

## 2019-08-21 NOTE — Assessment & Plan Note (Signed)
07/28/2019:Routine screening mammogram detected a 1.9cm group of calcifications in the left breast. Biopsy showed DCIS, high grade, ER+ 95%, PR+ 90%. Tis NX stage 0 08/12/2019: Left lumpectomy: High-grade DCIS with necrosis and calcifications 1.5 cm, margins negative, ER 95%, PR 90%  Pathology counseling: I discussed the final pathology report of the patient provided  a copy of this report. I discussed the margins . We also discussed the final staging along with previously performed ER/PR testing.  Treatment plan: 1.  Adjuvant radiation therapy 2.  Follow-up adjuvant antiestrogen therapy with tamoxifen x5 years  Return to clinic at the end of radiation

## 2019-08-25 DIAGNOSIS — J301 Allergic rhinitis due to pollen: Secondary | ICD-10-CM | POA: Diagnosis not present

## 2019-08-25 DIAGNOSIS — J3089 Other allergic rhinitis: Secondary | ICD-10-CM | POA: Diagnosis not present

## 2019-08-25 DIAGNOSIS — J3081 Allergic rhinitis due to animal (cat) (dog) hair and dander: Secondary | ICD-10-CM | POA: Diagnosis not present

## 2019-08-28 NOTE — Progress Notes (Signed)
Location of Breast Cancer: Left Breast  Histology per Pathology Report:  07/24/19 Diagnosis Breast, left, needle core biopsy, 2 o'clock, posterior depth - DUCTAL CARCINOMA IN SITU - CALCIFICATIONS AND NECROSIS  Receptor Status: ER(95%), PR (90%)  08/12/19 FINAL MICROSCOPIC DIAGNOSIS: A. BREAST, LEFT, LUMPECTOMY: - High grade ductal carcinoma with calcifications and necrosis, spanning 1.5 cm, see comment. - Resection margins are negative for carcinoma. - Biopsy site. - See oncology table. B. BREAST, LEFT ADDITIONAL LATERAL MARGIN, EXCISION: - Fibrocystic change. - No malignancy identified. C. BREAST, LEFT ADDITIONAL MEDIAL MARGIN, EXCISION: - Fibrocystic change. - No malignancy identified. D. BREAST, LEFT ADDITIONAL SUPERIOR MARGIN, EXCISION: - Fibrocystic change. - No malignancy identified.  Did patient present with symptoms or was this found on screening mammography?: It was found on a screening mammogram.   Past/Anticipated interventions by surgeon, if any: 08/12/19 PROCEDURE:   Procedure(s):  LEFT BREAST LUMPECTOMY WITH RADIOACTIVE SEED LOCALIZATION SURGEON:   Alphonsa Overall, M.D.  She has been unable to    Past/Anticipated interventions by medical oncology, if any:  08/21/19 Dr. Lindi Adie Treatment plan: 1.  Adjuvant radiation therapy 2.  Follow-up adjuvant antiestrogen therapy with tamoxifen x5 years I discussed with the patient that tamoxifen is considered optional.  We discussed the risks and benefits once again.  I provided them with more literature to read.  We may start them at 10 mg dose and titrated down if necessary.  Return to clinic at the end of radiation to finalize on the adjuvant antiestrogen plan.   Lymphedema issues, if any:  She reports swelling to the incision site.   Pain issues, if any:  She reports pain to her incision site when touched. She rates it a 2/10.  SAFETY ISSUES:  Prior radiation? No  Pacemaker/ICD? No  Possible current  pregnancy? No  Is the patient on methotrexate? No  Current Complaints / other details:    BP 122/70 (BP Location: Left Arm, Patient Position: Sitting)   Pulse 79   Temp 98.5 F (36.9 C) (Temporal)   Resp 18   Ht 4\' 11"  (1.499 m)   Wt 118 lb 2 oz (53.6 kg)   SpO2 100%   BMI 23.86 kg/m    Wt Readings from Last 3 Encounters:  09/02/19 118 lb 2 oz (53.6 kg)  08/21/19 117 lb 11.2 oz (53.4 kg)  08/12/19 110 lb (49.9 kg)      Zipporah Finamore, Stephani Police, RN 08/28/2019,10:25 AM

## 2019-09-01 NOTE — Progress Notes (Signed)
Radiation Oncology         [phone]) [phone] ________________________________  Name: Brenda Delgado MRN: [id number]  Date: 09/02/2019  DOB: 1945/12/21  Follow-Up Visit Note in person  Outpatient  CC: Libby Maw, MD  Nicholas Lose, MD  Diagnosis:      ICD-10-CM   1. Ductal carcinoma in situ (DCIS) of left breast  D05.12      Cancer Staging Ductal carcinoma in situ (DCIS) of left breast Staging form: Breast, AJCC 8th Edition - Clinical: Stage 0 (cTis (DCIS), cN0, cM0, G3, ER+, PR+, HER2: Not Assessed) - Signed by Nicholas Lose, MD on 07/30/2019 - Pathologic stage from 08/12/2019: Stage 0 (pTis (DCIS), pN0, cM0, ER+, PR+) - Signed by Gardenia Phlegm, NP on 08/27/2019   CHIEF COMPLAINT: Here to discuss management of left breast DCIS  Narrative:  The patient returns today for follow-up to discuss radiation treatment options. She was seen in the multidisciplinary breast clinic on 07/30/2019.     She opted to proceed with left lumpectomy on date of 08/12/2019 with pathology report revealing: tumor size of 1.5 cm; histology of ductal carcinoma; margin status to in situ disease of 7 mm; Grade 3.  She had undergone genetic counseling during breast clinic. Her results returned on 08/13/2019 and were negative with a VUS in MSH2.  Symptomatically, the patient reports:   Lymphedema issues, if any:  She reports swelling to the incision site.   Pain issues, if any:  She reports pain to her incision site when touched. She rates it a 2/10.  SAFETY ISSUES:  Prior radiation? No  Pacemaker/ICD? No  Possible current pregnancy? No  Is the patient on methotrexate? No  Current Complaints / other details:  She reports that Dr. Pollie Friar schedule was full and she is seeing him in mid February for post op follow-up. No oozing from lumpectomy scar.        ALLERGIES:  is allergic to pravastatin; erythromycin; iohexol; amoxicillin-pot clavulanate; codeine; moxifloxacin; and  sulfamethoxazole.  Meds: Current Outpatient Medications  Medication Sig Dispense Refill  . ALBUTEROL IN Inhale into the lungs as needed.    Marland Kitchen aspirin EC 81 MG tablet Take 81 mg by mouth daily.    . Calcium-Vitamin D-Vitamin K 500-500-40 MG-UNT-MCG CHEW Chew 2 tablets by mouth daily.    . Cholecalciferol (VITAMIN D3) 50 MCG (2000 UT) TABS Take 50 mcg by mouth 2 (two) times daily.    . Coenzyme Q10 (COQ10 PO) Take 1 capsule by mouth daily.    Marland Kitchen EPINEPHrine 0.3 mg/0.3 mL IJ SOAJ injection SMARTSIG:1 Pre-Filled Pen Syringe IM As Needed    . pantoprazole (PROTONIX) 40 MG tablet Take 40 mg by mouth at bedtime.     Marland Kitchen PARoxetine (PAXIL) 20 MG tablet Take 20 mg by mouth daily.    Marland Kitchen zinc gluconate 50 MG tablet Take 50 mg by mouth daily.     No current facility-administered medications for this encounter.    Physical Findings:  height is _0  (1.499 m) and weight is 118 lb 2 oz (53.6 kg). Her temporal temperature is 98.5 F (36.9 C). Her blood pressure is 122/70 and her pulse is 79. Her respiration is 18 and oxygen saturation is 100%. .     General: Alert and oriented, in no acute distress Neurologic: resting tremor. Speech is fluent.  Psychiatric: Judgment and insight are intact. Affect is appropriate. Breast exam reveals healing lateral left lumpectomy incision without oozing  Lab Findings: Lab Results  Component Value  Date   WBC 4.0 07/30/2019   HGB 13.9 07/30/2019   HCT 43.9 07/30/2019   MCV 92.2 07/30/2019   PLT 305 07/30/2019    Radiographic Findings: No results found.  Impression/Plan: Left Breast DCIS  We discussed adjuvant radiotherapy today.  I recommend 4 weeks directed at the left breast in order to reduce the risk of locoregional recurrence by 1/2.  The risks, benefits and side effects of this treatment were discussed in detail.  She understands that radiotherapy is associated with skin irritation and fatigue in the acute setting. Late effects can include cosmetic  changes and rare injury to internal organs.  She is enthusiastic about proceeding with treatment. A consent form has been signed and placed in her chart.  Proceed with simulation today.  We will start her treatment after she has had 4 weeks to heal from surgery.  On date of service, in total, I spent 25 minutes on this encounter. _____________________________________   Eppie Gibson, MD   This document serves as a record of services personally performed by Eppie Gibson, MD. It was created on her behalf by Wilburn Mylar, a trained medical scribe. The creation of this record is based on the scribe's personal observations and the provider's statements to them. This document has been checked and approved by the attending provider.

## 2019-09-02 ENCOUNTER — Ambulatory Visit
Admission: RE | Admit: 2019-09-02 | Discharge: 2019-09-02 | Disposition: A | Discharge status: Home / Self Care | Payer: Medicare Other | Source: Ambulatory Visit | Attending: Radiation Oncology | Admitting: Radiation Oncology

## 2019-09-02 ENCOUNTER — Encounter: Payer: Self-pay | Admitting: Radiation Oncology

## 2019-09-02 ENCOUNTER — Other Ambulatory Visit: Payer: Self-pay

## 2019-09-02 DIAGNOSIS — D0512 Intraductal carcinoma in situ of left breast: Secondary | ICD-10-CM | POA: Insufficient documentation

## 2019-09-02 DIAGNOSIS — Z9889 Other specified postprocedural states: Secondary | ICD-10-CM | POA: Diagnosis not present

## 2019-09-02 DIAGNOSIS — Z79899 Other long term (current) drug therapy: Secondary | ICD-10-CM | POA: Insufficient documentation

## 2019-09-02 DIAGNOSIS — Z7982 Long term (current) use of aspirin: Secondary | ICD-10-CM | POA: Insufficient documentation

## 2019-09-02 DIAGNOSIS — Z170 Estrogen receptor positive status [ER+]: Secondary | ICD-10-CM | POA: Insufficient documentation

## 2019-09-02 HISTORY — DX: Allergy, unspecified, initial encounter: T78.40XA

## 2019-09-02 NOTE — Progress Notes (Signed)
Radiation Oncology         [phone] ________________________________  Name: Brenda Delgado MRN: [id number]  Date: 09/02/2019  DOB: [birth year]  SIMULATION AND TREATMENT PLANNING NOTE   Special treatment procedure  Outpatient  DIAGNOSIS:     ICD-10-CM   1. Ductal carcinoma in situ (DCIS) of left breast  D05.12     NARRATIVE:  The patient was brought to the [facility].  Identity was confirmed.  All relevant records and images related to the planned course of therapy were reviewed.  The patient freely provided informed written consent to proceed with treatment after reviewing the details related to the planned course of therapy. The consent form was witnessed and verified by the simulation staff.    Then, the patient was set-up in a stable reproducible supine position for radiation therapy with her ipsilateral arm over her head, and her upper body secured in a custom-made Vac-lok device.  CT images were obtained.  Surface markings were placed.  The CT images were loaded into the planning software.    Special treatment procedure:  Special treatment procedure was performed today due to the extra time and effort required by myself to plan and prepare this patient for deep inspiration breath hold technique.  I have determined cardiac sparing to be of benefit to this patient to prevent long term cardiac damage due to radiation of the heart.  Bellows were placed on the patient's abdomen. To facilitate cardiac sparing, the patient was coached by the radiation therapists on breath hold techniques and breathing practice was performed. Practice waveforms were obtained. The patient was then scanned while maintaining breath hold in the treatment position.  This image was then transferred over to the imaging specialist. The imaging specialist then created a fusion of the free breathing and breath hold scans using the chest wall as the stable structure. I personally reviewed the fusion in  axial, coronal and sagittal image planes.  Excellent cardiac sparing was obtained.  I felt the patient is an appropriate candidate for breath hold and the patient will be treated as such.  The image fusion was then reviewed with the patient to reinforce the necessity of reproducible breath hold.   TREATMENT PLANNING NOTE: Treatment planning then occurred.  The radiation prescription was entered and confirmed.     A total of 3 medically necessary complex treatment devices were fabricated and supervised by me: 2 fields with MLCs for custom blocks to protect heart, and lungs;  and, a Vac-lok. MORE COMPLEX DEVICES MAY BE MADE IN DOSIMETRY FOR FIELD IN FIELD BEAMS FOR DOSE HOMOGENEITY.  I have requested : 3D Simulation which is medically necessary to give adequate dose to at risk tissues while sparing lungs and heart.  I have requested a DVH of the following structures: lungs, heart, left lumpectomy cavity .    The patient will receive 40.05 Gy in 15 fractions to the left breast with 2 tangential fields.  This will be followed by a boost.  Optical Surface Tracking Plan:  Since intensity modulated radiotherapy (IMRT) and 3D conformal radiation treatment methods are predicated on accurate and precise positioning for treatment, intrafraction motion monitoring is medically necessary to ensure accurate and safe treatment delivery. The ability to quantify intrafraction motion without excessive ionizing radiation dose can only be performed with optical surface tracking. Accordingly, surface imaging offers the opportunity to obtain 3D measurements of patient position throughout IMRT and 3D treatments without excessive radiation exposure. I am ordering optical surface  tracking for this patient's upcoming course of radiotherapy.  ________________________________   Reference:  Particia Jasper, et al. Surface imaging-based analysis of intrafraction motion for breast radiotherapy patients.Journal of  [place], n. 6, nov. 2014. ISSN [id number].  Available at: <http://www.jacmp.org/index.php/jacmp/article/view/4957>.    -----------------------------------  Eppie Gibson, MD

## 2019-09-03 ENCOUNTER — Encounter: Payer: Self-pay | Admitting: *Deleted

## 2019-09-03 ENCOUNTER — Telehealth: Payer: Self-pay | Admitting: Family Medicine

## 2019-09-03 DIAGNOSIS — J3089 Other allergic rhinitis: Secondary | ICD-10-CM | POA: Diagnosis not present

## 2019-09-03 DIAGNOSIS — J3081 Allergic rhinitis due to animal (cat) (dog) hair and dander: Secondary | ICD-10-CM | POA: Diagnosis not present

## 2019-09-03 DIAGNOSIS — J301 Allergic rhinitis due to pollen: Secondary | ICD-10-CM | POA: Diagnosis not present

## 2019-09-03 DIAGNOSIS — J4520 Mild intermittent asthma, uncomplicated: Secondary | ICD-10-CM | POA: Diagnosis not present

## 2019-09-03 NOTE — Telephone Encounter (Signed)
Patient is calling and requesting a refill for pantoprazole d 40 mg sent to [facility] on [address].

## 2019-09-05 ENCOUNTER — Ambulatory Visit: Payer: BC Managed Care – PPO

## 2019-09-05 DIAGNOSIS — D0512 Intraductal carcinoma in situ of left breast: Secondary | ICD-10-CM | POA: Diagnosis not present

## 2019-09-05 MED ORDER — PANTOPRAZOLE SODIUM 40 MG PO TBEC: 40.0000 mg | DELAYED_RELEASE_TABLET | Freq: Every day | ORAL | 0 refills | Status: DC

## 2019-09-05 NOTE — Telephone Encounter (Signed)
Refill sent in

## 2019-09-08 ENCOUNTER — Telehealth: Payer: Self-pay | Admitting: Hematology and Oncology

## 2019-09-08 NOTE — Telephone Encounter (Signed)
Scheduled apt per 1/27 sch message - mailed reminder letter with appt date and time

## 2019-09-10 ENCOUNTER — Ambulatory Visit
Admission: RE | Admit: 2019-09-10 | Discharge: 2019-09-10 | Disposition: A | Discharge status: Home / Self Care | Payer: Medicare Other | Source: Ambulatory Visit | Attending: Radiation Oncology | Admitting: Radiation Oncology

## 2019-09-10 ENCOUNTER — Other Ambulatory Visit: Payer: Self-pay

## 2019-09-10 DIAGNOSIS — D0512 Intraductal carcinoma in situ of left breast: Secondary | ICD-10-CM | POA: Insufficient documentation

## 2019-09-11 ENCOUNTER — Ambulatory Visit
Admission: RE | Admit: 2019-09-11 | Discharge: 2019-09-11 | Disposition: A | Discharge status: Home / Self Care | Payer: Medicare Other | Source: Ambulatory Visit | Attending: Radiation Oncology | Admitting: Radiation Oncology

## 2019-09-11 ENCOUNTER — Other Ambulatory Visit: Payer: Self-pay

## 2019-09-11 DIAGNOSIS — J3089 Other allergic rhinitis: Secondary | ICD-10-CM | POA: Diagnosis not present

## 2019-09-11 DIAGNOSIS — D0512 Intraductal carcinoma in situ of left breast: Secondary | ICD-10-CM | POA: Diagnosis not present

## 2019-09-11 DIAGNOSIS — J3081 Allergic rhinitis due to animal (cat) (dog) hair and dander: Secondary | ICD-10-CM | POA: Diagnosis not present

## 2019-09-11 DIAGNOSIS — J301 Allergic rhinitis due to pollen: Secondary | ICD-10-CM | POA: Diagnosis not present

## 2019-09-12 ENCOUNTER — Other Ambulatory Visit: Payer: Self-pay

## 2019-09-12 ENCOUNTER — Ambulatory Visit
Admission: RE | Admit: 2019-09-12 | Discharge: 2019-09-12 | Disposition: A | Discharge status: Home / Self Care | Payer: Medicare Other | Source: Ambulatory Visit | Attending: Radiation Oncology | Admitting: Radiation Oncology

## 2019-09-12 DIAGNOSIS — D0512 Intraductal carcinoma in situ of left breast: Secondary | ICD-10-CM

## 2019-09-12 MED ORDER — RADIAPLEXRX EX GEL: Freq: Once | CUTANEOUS | Status: AC

## 2019-09-12 NOTE — Progress Notes (Signed)
Pt here for patient teaching.  Pt given Radiation and You booklet, skin care instructions and Radiaplex gel.  Reviewed areas of pertinence such as fatigue, skin changes, breast tenderness and breast swelling . Pt able to give teach back of to pat skin, use unscented/gentle soap and drink plenty of water,apply Radiaplex bid, avoid applying anything to skin within 4 hours of treatment, avoid wearing an under wire bra and to use an electric razor if they must shave. Pt verbalizes understanding of information given and will contact nursing with any questions or concerns.     Http://rtanswers.org/treatmentinformation/whattoexpect/index

## 2019-09-13 ENCOUNTER — Ambulatory Visit: Payer: BC Managed Care – PPO

## 2019-09-15 ENCOUNTER — Ambulatory Visit
Admission: RE | Admit: 2019-09-15 | Discharge: 2019-09-15 | Disposition: A | Discharge status: Home / Self Care | Payer: Medicare Other | Source: Ambulatory Visit | Attending: Radiation Oncology | Admitting: Radiation Oncology

## 2019-09-15 ENCOUNTER — Other Ambulatory Visit: Payer: Self-pay

## 2019-09-15 DIAGNOSIS — D0512 Intraductal carcinoma in situ of left breast: Secondary | ICD-10-CM | POA: Diagnosis not present

## 2019-09-16 ENCOUNTER — Ambulatory Visit
Admission: RE | Admit: 2019-09-16 | Discharge: 2019-09-16 | Disposition: A | Discharge status: Home / Self Care | Payer: Medicare Other | Source: Ambulatory Visit | Attending: Radiation Oncology | Admitting: Radiation Oncology

## 2019-09-16 ENCOUNTER — Other Ambulatory Visit: Payer: Self-pay

## 2019-09-16 DIAGNOSIS — D0512 Intraductal carcinoma in situ of left breast: Secondary | ICD-10-CM | POA: Diagnosis not present

## 2019-09-17 ENCOUNTER — Ambulatory Visit
Admission: RE | Admit: 2019-09-17 | Discharge: 2019-09-17 | Disposition: A | Discharge status: Home / Self Care | Payer: Medicare Other | Source: Ambulatory Visit | Attending: Radiation Oncology | Admitting: Radiation Oncology

## 2019-09-17 ENCOUNTER — Other Ambulatory Visit: Payer: Self-pay

## 2019-09-17 DIAGNOSIS — D0512 Intraductal carcinoma in situ of left breast: Secondary | ICD-10-CM | POA: Diagnosis not present

## 2019-09-18 ENCOUNTER — Ambulatory Visit
Admission: RE | Admit: 2019-09-18 | Discharge: 2019-09-18 | Disposition: A | Discharge status: Home / Self Care | Payer: Medicare Other | Source: Ambulatory Visit | Attending: Radiation Oncology | Admitting: Radiation Oncology

## 2019-09-18 ENCOUNTER — Other Ambulatory Visit: Payer: Self-pay

## 2019-09-18 DIAGNOSIS — D0512 Intraductal carcinoma in situ of left breast: Secondary | ICD-10-CM | POA: Diagnosis not present

## 2019-09-19 ENCOUNTER — Other Ambulatory Visit: Payer: Self-pay

## 2019-09-19 ENCOUNTER — Ambulatory Visit
Admission: RE | Admit: 2019-09-19 | Discharge: 2019-09-19 | Disposition: A | Discharge status: Home / Self Care | Payer: Medicare Other | Source: Ambulatory Visit | Attending: Radiation Oncology | Admitting: Radiation Oncology

## 2019-09-19 DIAGNOSIS — D0512 Intraductal carcinoma in situ of left breast: Secondary | ICD-10-CM | POA: Diagnosis not present

## 2019-09-22 ENCOUNTER — Ambulatory Visit
Admission: RE | Admit: 2019-09-22 | Discharge: 2019-09-22 | Disposition: A | Discharge status: Home / Self Care | Payer: Medicare Other | Source: Ambulatory Visit | Attending: Radiation Oncology | Admitting: Radiation Oncology

## 2019-09-22 ENCOUNTER — Other Ambulatory Visit: Payer: Self-pay

## 2019-09-22 DIAGNOSIS — D0512 Intraductal carcinoma in situ of left breast: Secondary | ICD-10-CM | POA: Diagnosis not present

## 2019-09-22 DIAGNOSIS — J3081 Allergic rhinitis due to animal (cat) (dog) hair and dander: Secondary | ICD-10-CM | POA: Diagnosis not present

## 2019-09-22 DIAGNOSIS — J301 Allergic rhinitis due to pollen: Secondary | ICD-10-CM | POA: Diagnosis not present

## 2019-09-22 DIAGNOSIS — J3089 Other allergic rhinitis: Secondary | ICD-10-CM | POA: Diagnosis not present

## 2019-09-23 ENCOUNTER — Other Ambulatory Visit: Payer: Self-pay

## 2019-09-23 ENCOUNTER — Ambulatory Visit
Admission: RE | Admit: 2019-09-23 | Discharge: 2019-09-23 | Disposition: A | Discharge status: Home / Self Care | Payer: Medicare Other | Source: Ambulatory Visit | Attending: Radiation Oncology | Admitting: Radiation Oncology

## 2019-09-23 DIAGNOSIS — D0512 Intraductal carcinoma in situ of left breast: Secondary | ICD-10-CM | POA: Diagnosis not present

## 2019-09-24 ENCOUNTER — Other Ambulatory Visit: Payer: Self-pay

## 2019-09-24 ENCOUNTER — Ambulatory Visit: Payer: Medicare Other | Admitting: Radiation Oncology

## 2019-09-24 ENCOUNTER — Ambulatory Visit
Admission: RE | Admit: 2019-09-24 | Discharge: 2019-09-24 | Disposition: A | Discharge status: Home / Self Care | Payer: Medicare Other | Source: Ambulatory Visit | Attending: Radiation Oncology | Admitting: Radiation Oncology

## 2019-09-24 DIAGNOSIS — D0512 Intraductal carcinoma in situ of left breast: Secondary | ICD-10-CM | POA: Diagnosis not present

## 2019-09-25 ENCOUNTER — Other Ambulatory Visit: Payer: Self-pay

## 2019-09-25 ENCOUNTER — Ambulatory Visit
Admission: RE | Admit: 2019-09-25 | Discharge: 2019-09-25 | Disposition: A | Discharge status: Home / Self Care | Payer: Medicare Other | Source: Ambulatory Visit | Attending: Radiation Oncology | Admitting: Radiation Oncology

## 2019-09-25 DIAGNOSIS — D0512 Intraductal carcinoma in situ of left breast: Secondary | ICD-10-CM | POA: Diagnosis not present

## 2019-09-26 ENCOUNTER — Other Ambulatory Visit: Payer: Self-pay

## 2019-09-26 ENCOUNTER — Ambulatory Visit
Admission: RE | Admit: 2019-09-26 | Discharge: 2019-09-26 | Disposition: A | Discharge status: Home / Self Care | Payer: Medicare Other | Source: Ambulatory Visit | Attending: Radiation Oncology | Admitting: Radiation Oncology

## 2019-09-26 DIAGNOSIS — D0512 Intraductal carcinoma in situ of left breast: Secondary | ICD-10-CM | POA: Diagnosis not present

## 2019-09-29 ENCOUNTER — Other Ambulatory Visit: Payer: Self-pay

## 2019-09-29 ENCOUNTER — Ambulatory Visit
Admission: RE | Admit: 2019-09-29 | Discharge: 2019-09-29 | Disposition: A | Discharge status: Home / Self Care | Payer: Medicare Other | Source: Ambulatory Visit | Attending: Radiation Oncology | Admitting: Radiation Oncology

## 2019-09-29 DIAGNOSIS — D0512 Intraductal carcinoma in situ of left breast: Secondary | ICD-10-CM | POA: Diagnosis not present

## 2019-09-30 ENCOUNTER — Encounter: Payer: Self-pay | Admitting: Radiation Oncology

## 2019-09-30 ENCOUNTER — Other Ambulatory Visit: Payer: Self-pay

## 2019-09-30 ENCOUNTER — Ambulatory Visit
Admission: RE | Admit: 2019-09-30 | Discharge: 2019-09-30 | Disposition: A | Discharge status: Home / Self Care | Payer: Medicare Other | Source: Ambulatory Visit | Attending: Radiation Oncology | Admitting: Radiation Oncology

## 2019-09-30 DIAGNOSIS — D0512 Intraductal carcinoma in situ of left breast: Secondary | ICD-10-CM | POA: Diagnosis not present

## 2019-10-01 ENCOUNTER — Other Ambulatory Visit: Payer: Self-pay

## 2019-10-01 ENCOUNTER — Ambulatory Visit
Admission: RE | Admit: 2019-10-01 | Discharge: 2019-10-01 | Disposition: A | Discharge status: Home / Self Care | Payer: Medicare Other | Source: Ambulatory Visit | Attending: Radiation Oncology | Admitting: Radiation Oncology

## 2019-10-01 DIAGNOSIS — D0512 Intraductal carcinoma in situ of left breast: Secondary | ICD-10-CM | POA: Diagnosis not present

## 2019-10-02 ENCOUNTER — Other Ambulatory Visit: Payer: Self-pay

## 2019-10-02 ENCOUNTER — Ambulatory Visit
Admission: RE | Admit: 2019-10-02 | Discharge: 2019-10-02 | Disposition: A | Discharge status: Home / Self Care | Payer: Medicare Other | Source: Ambulatory Visit | Attending: Radiation Oncology | Admitting: Radiation Oncology

## 2019-10-02 DIAGNOSIS — D0512 Intraductal carcinoma in situ of left breast: Secondary | ICD-10-CM | POA: Diagnosis not present

## 2019-10-03 ENCOUNTER — Other Ambulatory Visit: Payer: Self-pay

## 2019-10-03 ENCOUNTER — Ambulatory Visit
Admission: RE | Admit: 2019-10-03 | Discharge: 2019-10-03 | Disposition: A | Discharge status: Home / Self Care | Payer: Medicare Other | Source: Ambulatory Visit | Attending: Radiation Oncology | Admitting: Radiation Oncology

## 2019-10-03 DIAGNOSIS — D0512 Intraductal carcinoma in situ of left breast: Secondary | ICD-10-CM | POA: Diagnosis not present

## 2019-10-05 NOTE — Progress Notes (Signed)
Patient Care Team: Brenda Maw, MD as PCP - General (Family Medicine) Mauro Kaufmann, RN as Oncology Nurse Navigator Rockwell Germany, RN as Oncology Nurse Navigator Alphonsa Overall, MD as Consulting Physician (General Surgery) Nicholas Lose, MD as Consulting Physician (Hematology and Oncology) Eppie Gibson, MD as Attending Physician (Radiation Oncology)  DIAGNOSIS:    ICD-10-CM   1. Ductal carcinoma in situ (DCIS) of left breast  D05.12     SUMMARY OF ONCOLOGIC HISTORY: Oncology History  Ductal carcinoma in situ (DCIS) of left breast  07/28/2019 Initial Diagnosis   Routine screening mammogram detected a 1.9cm group of calcifications in the left breast. Biopsy showed DCIS, high grade, ER+ 95%, PR+ 90%.   07/30/2019 Cancer Staging   Staging form: Breast, AJCC 8th Edition - Clinical: Stage 0 (cTis (DCIS), cN0, cM0, G3, ER+, PR+, HER2: Not Assessed) - Signed by Nicholas Lose, MD on 07/30/2019    Genetic Testing   Negative genetic testing. No pathogenic variants identified, VUS in MSH2 Gain (Exons 11-16) identified on the [facility] Common Hereditary Cancers Panel + Breast Cancer STAT Panel. The STAT Breast cancer panel offered by [facility] includes sequencing and rearrangement analysis for the following 9 genes:  ATM, BRCA1, BRCA2, CDH1, CHEK2, PALB2, PTEN, STK11 and TP53.  The Common Hereditary Cancers Panel offered by [facility] includes sequencing and/or deletion duplication testing of the following 47 genes: APC, ATM, AXIN2, BARD1, BMPR1A, BRCA1, BRCA2, BRIP1, CDH1, CDKN2A (p14ARF), CDKN2A (p16INK4a), CKD4, CHEK2, CTNNA1, DICER1, EPCAM (Deletion/duplication testing only), GREM1 (promoter region deletion/duplication testing only), KIT, MEN1, MLH1, MSH2, MSH3, MSH6, MUTYH, NBN, NF1, NHTL1, PALB2, PDGFRA, PMS2, POLD1, POLE, PTEN, RAD50, RAD51C, RAD51D, SDHB, SDHC, SDHD, SMAD4, SMARCA4. STK11, TP53, TSC1, TSC2, and VHL.  The following genes were evaluated for sequence changes only: SDHA  and HOXB13 c.251G>A variant only. The report date is 08/12/2019.   08/12/2019 Cancer Staging   Staging form: Breast, AJCC 8th Edition - Pathologic stage from 08/12/2019: Stage 0 (pTis (DCIS), pN0, cM0, ER+, PR+) - Signed by Gardenia Phlegm, NP on 08/27/2019   08/31/2019 -  Radiation Therapy   Adjuvant radiation     CHIEF COMPLIANT: Follow-up to discuss antiestrogen therapy  INTERVAL HISTORY: Brenda Delgado is a 74 y.o. with above-mentioned history of left breast DCIS for which she underwent a lumpectomy and is currently on radiation. She presents to the clinic today to discuss antiestrogen treatment.   She has tolerated radiation fairly well with mild radiation dermatitis.  She will finish last dose of radiation tomorrow.  She is here today accompanied by her husband to discuss the pros and cons of antiestrogen therapy.  ALLERGIES:  is allergic to pravastatin; erythromycin; iohexol; amoxicillin-pot clavulanate; codeine; moxifloxacin; and sulfamethoxazole.  MEDICATIONS:  Current Outpatient Medications  Medication Sig Dispense Refill  . ALBUTEROL IN Inhale into the lungs as needed.    Marland Kitchen aspirin EC 81 MG tablet Take 81 mg by mouth daily.    . Calcium-Vitamin D-Vitamin K 500-500-40 MG-UNT-MCG CHEW Chew 2 tablets by mouth daily.    . Cholecalciferol (VITAMIN D3) 50 MCG (2000 UT) TABS Take 50 mcg by mouth 2 (two) times daily.    . Coenzyme Q10 (COQ10 PO) Take 1 capsule by mouth daily.    Marland Kitchen EPINEPHrine 0.3 mg/0.3 mL IJ SOAJ injection SMARTSIG:1 Pre-Filled Pen Syringe IM As Needed    . pantoprazole (PROTONIX) 40 MG tablet Take 1 tablet (40 mg total) by mouth at bedtime. 30 tablet 0  . PARoxetine (PAXIL) 20 MG tablet  Take 20 mg by mouth daily.    Marland Kitchen zinc gluconate 50 MG tablet Take 50 mg by mouth daily.     No current facility-administered medications for this visit.    PHYSICAL EXAMINATION: ECOG PERFORMANCE STATUS: 1 - Symptomatic but completely ambulatory  Vitals:   10/06/19 1154  BP:  116/72  Pulse: 68  Resp: 17  Temp: 98.9 F (37.2 C)  SpO2: 97%   Filed Weights   10/06/19 1154  Weight: 116 lb 14.4 oz (53 kg)    LABORATORY DATA:  I have reviewed the data as listed CMP Latest Ref Rng & Units 07/30/2019 07/22/2019 05/28/2019  Glucose 70 - 99 mg/dL 97 98 80  BUN 8 - 23 mg/dL _0 Creatinine 0.44 - 1.00 mg/dL 0.67 0.61 0.60  Sodium 135 - 145 mmol/L 143 143 140  Potassium 3.5 - 5.1 mmol/L 4.4 4.8 4.0  Chloride 98 - 111 mmol/L 104 105 101  CO2 22 - 32 mmol/L 30 31 -  Calcium 8.9 - 10.3 mg/dL 8.9 9.5 -  Total Protein 6.5 - 8.1 g/dL 6.5 6.4 -  Total Bilirubin 0.3 - 1.2 mg/dL 0.4 0.5 -  Alkaline Phos 38 - 126 U/L 72 67 -  AST 15 - 41 U/L 18 17 -  ALT 0 - 44 U/L 12 12 -    Lab Results  Component Value Date   WBC 4.0 07/30/2019   HGB 13.9 07/30/2019   HCT 43.9 07/30/2019   MCV 92.2 07/30/2019   PLT 305 07/30/2019   NEUTROABS 1.7 07/30/2019    ASSESSMENT & PLAN:  Ductal carcinoma in situ (DCIS) of left breast 07/28/2019:Routine screening mammogram detected a 1.9cm group of calcifications in the left breast. Biopsy showed DCIS, high grade, ER+ 95%, PR+ 90%. Tis NX stage 0 08/12/2019: Left lumpectomy: High-grade DCIS with necrosis and calcifications 1.5 cm, margins negative, ER 95%, PR 90%  Treatment plan: 1.  Adjuvant radiation therapy 09/11/2019-10/06/2019 2.  Follow-up adjuvant antiestrogen therapy with tamoxifen x5 years Discussed the pros and cons of adjuvant tamoxifen. We will plan to start at 10 mg daily. Enrolled her in the antiestrogen therapy compliance study She will start tamoxifen in 2 weeks from today.  Return to clinic in 3 months for survivorship care plan visit    No orders of the defined types were placed in this encounter.  The patient has a good understanding of the overall plan. she agrees with it. she will call with any problems that may develop before the next visit here.  Total time spent: 30 mins including face to face time  and time spent for planning, charting and coordination of care  Nicholas Lose, MD 10/06/2019  I, Cloyde Reams Dorshimer, am acting as scribe for Dr. Nicholas Lose.  I have reviewed the above documentation for accuracy and completeness, and I agree with the above.

## 2019-10-06 ENCOUNTER — Inpatient Hospital Stay: Payer: Medicare Other | Attending: Hematology and Oncology | Admitting: Hematology and Oncology

## 2019-10-06 ENCOUNTER — Other Ambulatory Visit: Payer: Self-pay

## 2019-10-06 ENCOUNTER — Encounter: Payer: Self-pay | Admitting: *Deleted

## 2019-10-06 ENCOUNTER — Ambulatory Visit
Admission: RE | Admit: 2019-10-06 | Discharge: 2019-10-06 | Disposition: A | Discharge status: Home / Self Care | Payer: Medicare Other | Source: Ambulatory Visit | Attending: Radiation Oncology | Admitting: Radiation Oncology

## 2019-10-06 DIAGNOSIS — Z79899 Other long term (current) drug therapy: Secondary | ICD-10-CM | POA: Insufficient documentation

## 2019-10-06 DIAGNOSIS — Z170 Estrogen receptor positive status [ER+]: Secondary | ICD-10-CM | POA: Insufficient documentation

## 2019-10-06 DIAGNOSIS — D0512 Intraductal carcinoma in situ of left breast: Secondary | ICD-10-CM | POA: Diagnosis not present

## 2019-10-06 DIAGNOSIS — J301 Allergic rhinitis due to pollen: Secondary | ICD-10-CM | POA: Diagnosis not present

## 2019-10-06 DIAGNOSIS — J3089 Other allergic rhinitis: Secondary | ICD-10-CM | POA: Diagnosis not present

## 2019-10-06 DIAGNOSIS — J3081 Allergic rhinitis due to animal (cat) (dog) hair and dander: Secondary | ICD-10-CM | POA: Diagnosis not present

## 2019-10-06 MED ORDER — TAMOXIFEN CITRATE 10 MG PO TABS: 10.0000 mg | ORAL_TABLET | Freq: Every day | ORAL | 3 refills | Status: DC

## 2019-10-06 NOTE — Assessment & Plan Note (Signed)
07/28/2019:Routine screening mammogram detected a 1.9cm group of calcifications in the left breast. Biopsy showed DCIS, high grade, ER+ 95%, PR+ 90%. Tis NX stage 0 08/12/2019: Left lumpectomy: High-grade DCIS with necrosis and calcifications 1.5 cm, margins negative, ER 95%, PR 90%  Treatment plan: 1.  Adjuvant radiation therapy 09/11/2019-10/06/2019 2.  Follow-up adjuvant antiestrogen therapy with tamoxifen x5 years Discussed the pros and cons of adjuvant tamoxifen. We will plan to start at 10 mg daily. Enroll Brenda Delgado in the antiestrogen therapy compliance study  Return to clinic in 3 months for survivorship care plan visit

## 2019-10-07 ENCOUNTER — Telehealth: Payer: Self-pay | Admitting: Hematology and Oncology

## 2019-10-07 ENCOUNTER — Other Ambulatory Visit: Payer: Self-pay | Admitting: Family Medicine

## 2019-10-07 ENCOUNTER — Telehealth: Payer: Self-pay | Admitting: Family Medicine

## 2019-10-07 ENCOUNTER — Ambulatory Visit
Admission: RE | Admit: 2019-10-07 | Discharge: 2019-10-07 | Disposition: A | Discharge status: Home / Self Care | Payer: Medicare Other | Source: Ambulatory Visit | Attending: Radiation Oncology | Admitting: Radiation Oncology

## 2019-10-07 ENCOUNTER — Other Ambulatory Visit: Payer: Self-pay

## 2019-10-07 ENCOUNTER — Encounter: Payer: Self-pay | Admitting: Radiation Oncology

## 2019-10-07 DIAGNOSIS — D0512 Intraductal carcinoma in situ of left breast: Secondary | ICD-10-CM | POA: Diagnosis not present

## 2019-10-07 MED ORDER — PANTOPRAZOLE SODIUM 40 MG PO TBEC: 40.0000 mg | DELAYED_RELEASE_TABLET | Freq: Every day | ORAL | 3 refills | Status: DC

## 2019-10-07 NOTE — Telephone Encounter (Signed)
Patient is requesting a refill for Pantoprazole 90 day supply sent to [facility] in [facility]. CB is [phone]

## 2019-10-07 NOTE — Telephone Encounter (Signed)
I left a message regarding schedule  

## 2019-10-10 DIAGNOSIS — Z0120 Encounter for dental examination and cleaning without abnormal findings: Secondary | ICD-10-CM | POA: Diagnosis not present

## 2019-10-20 NOTE — Progress Notes (Signed)
  Patient Name: Brenda Delgado MRN: [id number] DOB: Nov 08, 1945 Referring Physician: Abelino Derrick Date of Service: 09/30/2019 [facility] [place]                                                        End Of Treatment Note  Diagnoses: D05.12-Intraductal carcinoma in situ of left breast  Cancer Staging: Cancer Staging Ductal carcinoma in situ (DCIS) of left breast Staging form: Breast, AJCC 8th Edition - Clinical: Stage 0 (cTis (DCIS), cN0, cM0, G3, ER+, PR+, HER2: Not Assessed) - Signed by Nicholas Lose, MD on 07/30/2019 - Pathologic stage from 08/12/2019: Stage 0 (pTis (DCIS), pN0, cM0, ER+, PR+) - Signed by Gardenia Phlegm, NP on 08/27/2019   Intent: Expedited  Radiation Treatment Dates: 09/10/2019 through 09/30/2019 Site Technique Total Dose (Gy) Dose per Fx (Gy) Completed Fx Beam Energies  Breast, Left: Breast_Lt 3D 40.05/40.05 2.67 15/15 6X, 10X   Narrative: The patient tolerated radiation therapy relatively well.   Plan: The patient will follow-up with radiation oncology in 1 month, or as needed. -----------------------------------  Eppie Gibson, MD

## 2019-10-22 DIAGNOSIS — J3089 Other allergic rhinitis: Secondary | ICD-10-CM | POA: Diagnosis not present

## 2019-10-22 DIAGNOSIS — J301 Allergic rhinitis due to pollen: Secondary | ICD-10-CM | POA: Diagnosis not present

## 2019-10-22 DIAGNOSIS — J3081 Allergic rhinitis due to animal (cat) (dog) hair and dander: Secondary | ICD-10-CM | POA: Diagnosis not present

## 2019-10-31 DIAGNOSIS — M25561 Pain in right knee: Secondary | ICD-10-CM | POA: Diagnosis not present

## 2019-10-31 DIAGNOSIS — M25512 Pain in left shoulder: Secondary | ICD-10-CM | POA: Diagnosis not present

## 2019-10-31 DIAGNOSIS — M1611 Unilateral primary osteoarthritis, right hip: Secondary | ICD-10-CM | POA: Diagnosis not present

## 2019-11-04 DIAGNOSIS — M25612 Stiffness of left shoulder, not elsewhere classified: Secondary | ICD-10-CM | POA: Diagnosis not present

## 2019-11-04 DIAGNOSIS — M25512 Pain in left shoulder: Secondary | ICD-10-CM | POA: Diagnosis not present

## 2019-11-04 DIAGNOSIS — M6281 Muscle weakness (generalized): Secondary | ICD-10-CM | POA: Diagnosis not present

## 2019-11-04 NOTE — Progress Notes (Signed)
I called the patient today about her upcoming follow-up appointment in radiation oncology.   Given the state of the COVID-19 pandemic, concerning case numbers in our community, and guidance from [facility], I offered a phone assessment with the patient to determine if coming to the clinic was necessary. She accepted.  I let the patient know that I had spoken with Dr. Isidore Moos, and she wanted them to know the importance of washing their hands for at least 20 seconds at a time, especially after going out in public, and before they eat.  Limit going out in public whenever possible. Do not touch your face, unless your hands are clean, such as when bathing. Get plenty of rest, eat well, and stay hydrated. Patient verbalized understanding and agreement.  The patient denies any symptomatic concerns.  Specifically, they report good healing of their skin in the radiation fields.  Skin is intact.  She states she saw her surgeon Dr. [name] last week who assessed her breast and stated that her skin "looked wonderful".  I recommended that she continue skin care by applying oil or lotion with vitamin E to the skin in the radiation fields, BID, for 2 more months.  Continue follow-up with medical oncology - follow-up is scheduled on 01/09/2020 with Mendel Ryder Causey-NP with Survivorship Program.  I explained that yearly mammograms are important for patients with intact breast tissue, and physical exams are important after mastectomy for patients that cannot undergo mammography. Patient verbalized understanding and agreement.  I encouraged her to call if she had further questions or concerns about her healing. Otherwise, she will follow-up PRN in radiation oncology. Patient is pleased with this plan, and we will cancel her upcoming follow-up to reduce the risk of COVID-19 transmission.

## 2019-11-05 DIAGNOSIS — M25512 Pain in left shoulder: Secondary | ICD-10-CM | POA: Diagnosis not present

## 2019-11-05 DIAGNOSIS — M25612 Stiffness of left shoulder, not elsewhere classified: Secondary | ICD-10-CM | POA: Diagnosis not present

## 2019-11-05 DIAGNOSIS — M6281 Muscle weakness (generalized): Secondary | ICD-10-CM | POA: Diagnosis not present

## 2019-11-06 ENCOUNTER — Telehealth: Payer: Self-pay | Admitting: Radiation Oncology

## 2019-11-06 DIAGNOSIS — M6281 Muscle weakness (generalized): Secondary | ICD-10-CM | POA: Diagnosis not present

## 2019-11-06 DIAGNOSIS — J3081 Allergic rhinitis due to animal (cat) (dog) hair and dander: Secondary | ICD-10-CM | POA: Diagnosis not present

## 2019-11-06 DIAGNOSIS — M25512 Pain in left shoulder: Secondary | ICD-10-CM | POA: Diagnosis not present

## 2019-11-06 DIAGNOSIS — J301 Allergic rhinitis due to pollen: Secondary | ICD-10-CM | POA: Diagnosis not present

## 2019-11-06 DIAGNOSIS — M25612 Stiffness of left shoulder, not elsewhere classified: Secondary | ICD-10-CM | POA: Diagnosis not present

## 2019-11-06 DIAGNOSIS — J3089 Other allergic rhinitis: Secondary | ICD-10-CM | POA: Diagnosis not present

## 2019-11-06 NOTE — Telephone Encounter (Signed)
Patient scheduled for follow up appointment with Dr. Isidore Moos tomorrow at 87. Phoned patient to inquire if she prefers to come into the clinic for this appointment or would rather Dr. Isidore Moos call her on the phone. No answer at either number. Left voicemail message requesting return call with her preference. Awaiting call back.

## 2019-11-07 ENCOUNTER — Ambulatory Visit
Admission: RE | Admit: 2019-11-07 | Discharge: 2019-11-07 | Disposition: A | Discharge status: Home / Self Care | Payer: BC Managed Care – PPO | Source: Ambulatory Visit | Attending: Radiation Oncology | Admitting: Radiation Oncology

## 2019-11-07 ENCOUNTER — Other Ambulatory Visit: Payer: Self-pay

## 2019-11-07 DIAGNOSIS — D0512 Intraductal carcinoma in situ of left breast: Secondary | ICD-10-CM

## 2019-11-10 DIAGNOSIS — M25612 Stiffness of left shoulder, not elsewhere classified: Secondary | ICD-10-CM | POA: Diagnosis not present

## 2019-11-10 DIAGNOSIS — M25512 Pain in left shoulder: Secondary | ICD-10-CM | POA: Diagnosis not present

## 2019-11-10 DIAGNOSIS — M6281 Muscle weakness (generalized): Secondary | ICD-10-CM | POA: Diagnosis not present

## 2019-11-12 ENCOUNTER — Telehealth: Payer: Self-pay | Admitting: Family Medicine

## 2019-11-12 NOTE — Telephone Encounter (Signed)
Left message for patient to schedule Annual Wellness Visit.  Please schedule with Nurse Health Advisor Victoria Britt, RN at [place]  

## 2019-11-13 DIAGNOSIS — M6281 Muscle weakness (generalized): Secondary | ICD-10-CM | POA: Diagnosis not present

## 2019-11-13 DIAGNOSIS — M25512 Pain in left shoulder: Secondary | ICD-10-CM | POA: Diagnosis not present

## 2019-11-13 DIAGNOSIS — M25612 Stiffness of left shoulder, not elsewhere classified: Secondary | ICD-10-CM | POA: Diagnosis not present

## 2019-11-14 DIAGNOSIS — M25612 Stiffness of left shoulder, not elsewhere classified: Secondary | ICD-10-CM | POA: Diagnosis not present

## 2019-11-14 DIAGNOSIS — M25512 Pain in left shoulder: Secondary | ICD-10-CM | POA: Diagnosis not present

## 2019-11-14 DIAGNOSIS — M6281 Muscle weakness (generalized): Secondary | ICD-10-CM | POA: Diagnosis not present

## 2019-11-17 DIAGNOSIS — M25512 Pain in left shoulder: Secondary | ICD-10-CM | POA: Diagnosis not present

## 2019-11-17 DIAGNOSIS — M6281 Muscle weakness (generalized): Secondary | ICD-10-CM | POA: Diagnosis not present

## 2019-11-17 DIAGNOSIS — M25612 Stiffness of left shoulder, not elsewhere classified: Secondary | ICD-10-CM | POA: Diagnosis not present

## 2019-11-17 DIAGNOSIS — J3089 Other allergic rhinitis: Secondary | ICD-10-CM | POA: Diagnosis not present

## 2019-11-17 DIAGNOSIS — J3081 Allergic rhinitis due to animal (cat) (dog) hair and dander: Secondary | ICD-10-CM | POA: Diagnosis not present

## 2019-11-17 DIAGNOSIS — J301 Allergic rhinitis due to pollen: Secondary | ICD-10-CM | POA: Diagnosis not present

## 2019-11-18 DIAGNOSIS — M25512 Pain in left shoulder: Secondary | ICD-10-CM | POA: Diagnosis not present

## 2019-11-18 DIAGNOSIS — M25612 Stiffness of left shoulder, not elsewhere classified: Secondary | ICD-10-CM | POA: Diagnosis not present

## 2019-11-18 DIAGNOSIS — M6281 Muscle weakness (generalized): Secondary | ICD-10-CM | POA: Diagnosis not present

## 2019-11-19 NOTE — Progress Notes (Signed)
  Patient Name: Brenda Delgado MRN: [id number] DOB: Oct 23, 1945 Referring Physician: Abelino Derrick Date of Service: 10/07/2019  [facility] [place]                                                        End Of Treatment Note  Diagnoses: D05.12-Intraductal carcinoma in situ of left breast  Cancer Staging: Cancer Staging Ductal carcinoma in situ (DCIS) of left breast Staging form: Breast, AJCC 8th Edition - Clinical: Stage 0 (cTis (DCIS), cN0, cM0, G3, ER+, PR+, HER2: Not Assessed) - Signed by Nicholas Lose, MD on 07/30/2019 - Pathologic stage from 08/12/2019: Stage 0 (pTis (DCIS), pN0, cM0, ER+, PR+) - Signed by Gardenia Phlegm, NP on 08/27/2019  Intent: curative Radiation Treatment Dates: 09/10/2019 through 10/07/2019 Site Technique Total Dose (Gy) Dose per Fx (Gy) Completed Fx Beam Energies  Breast, Left: Breast_Lt 3D 40.05/40.05 2.67 15/15 6X, 10X  Breast, Left: Breast_Lt_Bst 3D 10/10 2 5/5 6X, 10X   Narrative: The patient tolerated radiation therapy relatively well.   Plan: The patient will follow-up with radiation oncology in 1 mo or as needed.  -----------------------------------  Eppie Gibson, MD

## 2019-12-02 DIAGNOSIS — M6281 Muscle weakness (generalized): Secondary | ICD-10-CM | POA: Diagnosis not present

## 2019-12-02 DIAGNOSIS — M25512 Pain in left shoulder: Secondary | ICD-10-CM | POA: Diagnosis not present

## 2019-12-02 DIAGNOSIS — M25612 Stiffness of left shoulder, not elsewhere classified: Secondary | ICD-10-CM | POA: Diagnosis not present

## 2019-12-05 DIAGNOSIS — J3089 Other allergic rhinitis: Secondary | ICD-10-CM | POA: Diagnosis not present

## 2019-12-05 DIAGNOSIS — J301 Allergic rhinitis due to pollen: Secondary | ICD-10-CM | POA: Diagnosis not present

## 2019-12-05 DIAGNOSIS — J3081 Allergic rhinitis due to animal (cat) (dog) hair and dander: Secondary | ICD-10-CM | POA: Diagnosis not present

## 2019-12-05 DIAGNOSIS — M25512 Pain in left shoulder: Secondary | ICD-10-CM | POA: Diagnosis not present

## 2019-12-05 DIAGNOSIS — M1611 Unilateral primary osteoarthritis, right hip: Secondary | ICD-10-CM | POA: Diagnosis not present

## 2019-12-25 DIAGNOSIS — J301 Allergic rhinitis due to pollen: Secondary | ICD-10-CM | POA: Diagnosis not present

## 2019-12-25 DIAGNOSIS — J3081 Allergic rhinitis due to animal (cat) (dog) hair and dander: Secondary | ICD-10-CM | POA: Diagnosis not present

## 2019-12-25 DIAGNOSIS — J3089 Other allergic rhinitis: Secondary | ICD-10-CM | POA: Diagnosis not present

## 2020-01-08 DIAGNOSIS — J3081 Allergic rhinitis due to animal (cat) (dog) hair and dander: Secondary | ICD-10-CM | POA: Diagnosis not present

## 2020-01-08 DIAGNOSIS — J3089 Other allergic rhinitis: Secondary | ICD-10-CM | POA: Diagnosis not present

## 2020-01-08 DIAGNOSIS — J301 Allergic rhinitis due to pollen: Secondary | ICD-10-CM | POA: Diagnosis not present

## 2020-01-09 ENCOUNTER — Inpatient Hospital Stay: Payer: Medicare Other | Attending: Hematology and Oncology | Admitting: Adult Health

## 2020-01-09 ENCOUNTER — Other Ambulatory Visit: Payer: Self-pay

## 2020-01-09 VITALS — BP 119/72 | HR 72 | Temp 98.7°F | Resp 17 | Ht 59.0 in | Wt 115.4 lb

## 2020-01-09 DIAGNOSIS — I890 Lymphedema, not elsewhere classified: Secondary | ICD-10-CM | POA: Insufficient documentation

## 2020-01-09 DIAGNOSIS — Z923 Personal history of irradiation: Secondary | ICD-10-CM | POA: Insufficient documentation

## 2020-01-09 DIAGNOSIS — D0512 Intraductal carcinoma in situ of left breast: Secondary | ICD-10-CM

## 2020-01-09 DIAGNOSIS — Z79810 Long term (current) use of selective estrogen receptor modulators (SERMs): Secondary | ICD-10-CM | POA: Diagnosis not present

## 2020-01-09 DIAGNOSIS — Z170 Estrogen receptor positive status [ER+]: Secondary | ICD-10-CM | POA: Insufficient documentation

## 2020-01-09 NOTE — Progress Notes (Signed)
SURVIVORSHIP VISIT:    BRIEF ONCOLOGIC HISTORY:  Oncology History  Ductal carcinoma in situ (DCIS) of left breast  07/28/2019 Initial Diagnosis   Routine screening mammogram detected a 1.9cm group of calcifications in the left breast. Biopsy showed DCIS, high grade, ER+ 95%, PR+ 90%.   07/30/2019 Cancer Staging   Staging form: Breast, AJCC 8th Edition - Clinical: Stage 0 (cTis (DCIS), cN0, cM0, G3, ER+, PR+, HER2: Not Assessed)    08/12/2019 Genetic Testing   Negative genetic testing. No pathogenic variants identified, VUS in MSH2 Gain (Exons 11-16) identified on the [facility] Common Hereditary Cancers Panel + Breast Cancer STAT Panel. The STAT Breast cancer panel offered by [facility] includes sequencing and rearrangement analysis for the following 9 genes:  ATM, BRCA1, BRCA2, CDH1, CHEK2, PALB2, PTEN, STK11 and TP53.  The Common Hereditary Cancers Panel offered by [facility] includes sequencing and/or deletion duplication testing of the following 47 genes: APC, ATM, AXIN2, BARD1, BMPR1A, BRCA1, BRCA2, BRIP1, CDH1, CDKN2A (p14ARF), CDKN2A (p16INK4a), CKD4, CHEK2, CTNNA1, DICER1, EPCAM (Deletion/duplication testing only), GREM1 (promoter region deletion/duplication testing only), KIT, MEN1, MLH1, MSH2, MSH3, MSH6, MUTYH, NBN, NF1, NHTL1, PALB2, PDGFRA, PMS2, POLD1, POLE, PTEN, RAD50, RAD51C, RAD51D, SDHB, SDHC, SDHD, SMAD4, SMARCA4. STK11, TP53, TSC1, TSC2, and VHL.  The following genes were evaluated for sequence changes only: SDHA and HOXB13 c.251G>A variant only. The report date is 08/12/2019.   08/12/2019 Cancer Staging   Staging form: Breast, AJCC 8th Edition - Pathologic stage from 08/12/2019: Stage 0 (pTis (DCIS), pN0, cM0, ER+, PR+)   08/12/2019 Surgery   Left Breast Lumpectomy [name]) [phone]): high grade DCIS with calcifications and necrosis, spanning 1.5cm. Additional left lateral, medial, and superior magrins showed fibrocystic changes. No regional lymph nodes were examined. ER+ PR+.    09/10/2019 - 10/07/2019 Radiation Therapy   The patient initially received a dose of 40.05 Gy in 15 fractions to the breast using whole-breast tangent fields. This was delivered using a 3-D conformal technique. The pt received a boost delivering an additional 10 Gy in 5 fractions using a electron boost with 57mV electrons. The total dose was 50.05 Gy.   10/2019 - 10/2024 Anti-estrogen oral therapy   Tamoxifen     INTERVAL HISTORY:  Ms. DEckertto review her survivorship care plan detailing her treatment course for breast cancer, as well as monitoring long-term side effects of that treatment, education regarding health maintenance, screening, and overall wellness and health promotion.     Overall, Ms. DHeywardreports feeling quite well.  She completed the survivorship survey.  She screened positive for cognitive dysfunction.  She has mild fatigue, and mild hot flashes and vaginal dryness.  She is taking Tamoxifen daily and tolerating it well.  She underwent bone density with gynecology prior to the pandemic.  She has not had this repeated.    REVIEW OF SYSTEMS:  Review of Systems - Oncology Breast: Denies any new nodularity, masses, tenderness, nipple changes, or nipple discharge.      ONCOLOGY TREATMENT TEAM:  1. Surgeon[facility]  [facility] 2. Medical Oncologist: Dr. GLindi Adie 3. Radiation Oncologist: Dr. SIsidore Moos   PAST MEDICAL/SURGICAL HISTORY:  Past Medical History:  Diagnosis Date  . Allergy   . Anxiety   . Arthritis    right knee  . Cancer ([place] 07/2019   left breast DCIS  . Complication of anesthesia    per pt with surgery 05-14-2019 right facial swelling and lip/ mouth blister's  . Constipation   . Depression   .  Environmental and seasonal allergies   . Family history of colon cancer   . Family history of ovarian cancer   . GERD (gastroesophageal reflux disease)   . Hiatal hernia   . History of kidney stones   . Left ureteral stone   . Mild asthma   .  Nephrolithiasis   . Nocturia   . PONV (postoperative nausea and vomiting)   . Pre-diabetes   . Renal calculus, left   . Wears glasses   . Wears hearing aid in both ears    Past Surgical History:  Procedure Laterality Date  . ABDOMINAL HYSTERECTOMY    . ANTERIOR CERVICAL DECOMP/DISCECTOMY FUSION  01-06-2010   _0    C4--5  . BREAST LUMPECTOMY WITH RADIOACTIVE SEED LOCALIZATION Left 08/12/2019   Procedure: LEFT BREAST LUMPECTOMY WITH RADIOACTIVE SEED LOCALIZATION;  Surgeon: Alphonsa Overall, MD;  Location: [facility];  Service: General;  Laterality: Left;  . CARPAL TUNNEL RELEASE Right 2000  . cataracts  2018   bilateral  . CYSTOCELE REPAIR  09/11/2011   Procedure: ANTERIOR REPAIR (CYSTOCELE);  Surgeon: Olga Millers, MD;  Location: [facility];  Service: Gynecology;  Laterality: N/A;  . CYSTOSCOPY  09/11/2011   Procedure: CYSTOSCOPY;  Surgeon: Olga Millers, MD;  Location: [facility];  Service: Gynecology;  Laterality: N/A;  . CYSTOSCOPY WITH RETROGRADE PYELOGRAM, URETEROSCOPY AND STENT PLACEMENT Left 05/28/2019   Procedure: CYSTOSCOPY WITH RETROGRADE PYELOGRAM, URETEROSCOPY AND STENT REPLACEMENT;  Surgeon: Alexis Frock, MD;  Location: [facility];  Service: Urology;  Laterality: Left;  . CYSTOSCOPY WITH STENT PLACEMENT Left 05/14/2019   Procedure: CYSTOSCOPY WITH STENT PLACEMENT, RETROGRADE;  Surgeon: Bjorn Loser, MD;  Location: [facility];  Service: Urology;  Laterality: Left;  . ELBOW SURGERY Right   . EXCISION VULVA CYST Bilateral 01-16-2005  _1   . HOLMIUM LASER APPLICATION Left [id number]   Procedure: HOLMIUM LASER APPLICATION;  Surgeon: Alexis Frock, MD;  Location: [facility];  Service: Urology;  Laterality: Left;  . KNEE ARTHROSCOPY Right 2005;    02-11-2013  . KNEE SURGERY Right 08-20-2012   _2    peroneal nerve neurolysis proximal tibia/ fibular articular branch   . LAPAROSCOPIC CHOLECYSTECTOMY  1990s  . SACROSPINOUS LIGAMENT  FIXATION  04-05-2017   _3    UPHOLD vaginal mesh and  Anterior Repair  . SALPINGOOPHORECTOMY  09/11/2011   Procedure: SALPINGO OOPHERECTOMY;  Surgeon: Olga Millers, MD;  Location: [facility];  Service: Gynecology;  Laterality: Bilateral;  . SHOULDER SURGERY Right   . TONSILLECTOMY    . VAGINAL HYSTERECTOMY  09/11/2011   Procedure: HYSTERECTOMY VAGINAL;  Surgeon: Olga Millers, MD;  Location: [facility];  Service: Gynecology;  Laterality: N/A;  . [place] Dana Milagros Loll BIOPSY  09/11/2011   Procedure: VULVAR BIOPSY;  Surgeon: Olga Millers, MD;  Location: [facility];  Service: Gynecology;  Laterality: Left;  sebaceous cyst excision left vula     ALLERGIES:  Allergies  Allergen Reactions  . Pravastatin     Other reaction(s): Myalgias (intolerance) 2016  . Erythromycin Nausea And Vomiting    Cramps   . Iohexol Hives     Code: HIVES, Desc: Pt's husband called when they got home from pt's CEPI #1.  Pt w/ hives/itching.  Instructed on taking Benadryl now, 1hr and q6hrs prn.  Instructed to add IV Contrast to allergy list, esp for CT scans, and to get pre-med info in the future.  jkl, Onset Date: [id number]   . Amoxicillin-Pot Clavulanate Other (See Comments)  GI Upset    . Codeine Nausea And Vomiting  . Moxifloxacin Itching  . Sulfamethoxazole Itching and Rash     CURRENT MEDICATIONS:  Outpatient Encounter Medications as of 01/09/2020  Medication Sig  . ALBUTEROL IN Inhale into the lungs as needed.  Marland Kitchen aspirin EC 81 MG tablet Take 81 mg by mouth daily.  . Calcium-Vitamin D-Vitamin K 500-500-40 MG-UNT-MCG CHEW Chew 2 tablets by mouth daily.  . Cholecalciferol (VITAMIN D3) 50 MCG (2000 UT) TABS Take 50 mcg by mouth 2 (two) times daily.  . Coenzyme Q10 (COQ10 PO) Take 1 capsule by mouth daily.  Marland Kitchen EPINEPHrine 0.3 mg/0.3 mL IJ SOAJ injection SMARTSIG:1 Pre-Filled Pen Syringe IM As Needed  . pantoprazole (PROTONIX) 40 MG tablet Take 1 tablet (40 mg total) by mouth at bedtime.  Marland Kitchen PARoxetine (PAXIL) 20 MG  tablet Take 20 mg by mouth daily.  . tamoxifen (NOLVADEX) 10 MG tablet Take 1 tablet (10 mg total) by mouth daily.  . [DISCONTINUED] azithromycin (ZITHROMAX) 250 MG tablet Take 250 mg by mouth as directed.  . [DISCONTINUED] zinc gluconate 50 MG tablet Take 50 mg by mouth daily.   No facility-administered encounter medications on file as of 01/09/2020.     ONCOLOGIC FAMILY HISTORY:  Family History  Problem Relation Age of Onset  . Skin cancer Mother   . Colon cancer Paternal Uncle        dx late 58s-60s  . Ovarian cancer Paternal Aunt 47  . Cancer Cousin        unk type     GENETIC COUNSELING/TESTING: See above  SOCIAL HISTORY:  Social History   Socioeconomic History  . Marital status: Married    Spouse name: Not on file  . Number of children: Not on file  . Years of education: Not on file  . Highest education level: Not on file  Occupational History  . Not on file  Tobacco Use  . Smoking status: Never Smoker  . Smokeless tobacco: Never Used  Substance and Sexual Activity  . Alcohol use: Yes    Comment: occasional  . Drug use: Never  . Sexual activity: Not on file  Other Topics Concern  . Not on file  Social History Narrative  . Not on file   Social Determinants of Health   Financial Resource Strain:   . Difficulty of Paying Living Expenses:   Food Insecurity:   . Worried About Charity fundraiser in the Last Year:   . Arboriculturist in the Last Year:   Transportation Needs:   . Film/video editor (Medical):   Marland Kitchen Lack of Transportation (Non-Medical):   Physical Activity:   . Days of Exercise per Week:   . Minutes of Exercise per Session:   Stress:   . Feeling of Stress :   Social Connections:   . Frequency of Communication with Friends and Family:   . Frequency of Social Gatherings with Friends and Family:   . Attends Religious Services:   . Active Member of Clubs or Organizations:   . Attends Archivist Meetings:   Marland Kitchen Marital Status:    Intimate Partner Violence:   . Fear of Current or Ex-Partner:   . Emotionally Abused:   Marland Kitchen Physically Abused:   . Sexually Abused:      OBSERVATIONS/OBJECTIVE:  BP 119/72 (BP Location: Left Arm, Patient Position: Sitting)   Pulse 72   Temp 98.7 F (37.1 C)   Resp 17   Ht 4'  11" (1.499 m)   Wt 115 lb 6.4 oz (52.3 kg)   SpO2 98%   BMI 23.31 kg/m  GENERAL: Patient is a well appearing female in no acute distress HEENT:  Sclerae anicteric.  Oropharynx clear and moist. No ulcerations or evidence of oropharyngeal candidiasis. Neck is supple.  NODES:  No cervical, supraclavicular, or axillary lymphadenopathy palpated.  BREAST EXAM:  Left breast swollen, erythematous, right breast benign, no sign of local recurrence LUNGS:  Clear to auscultation bilaterally.  No wheezes or rhonchi. HEART:  Regular rate and rhythm. No murmur appreciated. ABDOMEN:  Soft, nontender.  Positive, normoactive bowel sounds. No organomegaly palpated. MSK:  No focal spinal tenderness to palpation. Full range of motion bilaterally in the upper extremities. EXTREMITIES:  No peripheral edema.   SKIN:  Clear with no obvious rashes or skin changes. No nail dyscrasia. NEURO:  Nonfocal. Well oriented.  Appropriate affect.    LABORATORY DATA:  None for this visit.  DIAGNOSTIC IMAGING:  None for this visit.      ASSESSMENT AND PLAN:  Ms.. Brenda Delgado is a pleasant 74 y.o. female with Stage 0 left breast DCIS, ER+/PR+, diagnosed in 07/2019, treated with lumpectomy, adjuvant radiation therapy, and anti-estrogen therapy with Tamoxifen beginning in 10/2019. She presents to the Survivorship Clinic for our initial meeting and routine follow-up post-completion of treatment for breast cancer.    1. Stage 0 left breast cancer:  Ms. Ruben is continuing to recover from definitive treatment for breast cancer. She will follow-up with her medical oncologist, Dr. Lindi Adie with history and physical exam per surveillance protocol.  She will  continue her anti-estrogen therapy with Tamoxifen. Thus far, she is tolerating the Tamoxifen  well, with minimal side effects. She was instructed to make Dr. Lindi Adie or myself aware if she begins to experience any worsening side effects of the medication and I could see her back in clinic to help manage those side effects, as needed. Her mammogram is due 07/2020; orders placed today. Today, a comprehensive survivorship care plan and treatment summary was reviewed with the patient today detailing her breast cancer diagnosis, treatment course, potential late/long-term effects of treatment, appropriate follow-up care with recommendations for the future, and patient education resources.  A copy of this summary, along with a letter will be sent to the patient's primary care provider via mail/fax/In Basket message after today's visit.    2. Bone health:  She undergoes bone density testing with her PCP.  I counseled her that tamoxifen often has a protective effect on the bones.  She was given education on specific activities to promote bone health.  3. Cancer screening:  Due to Ms. Albor's history and her age, she should receive screening for skin cancers, colon cancer, and gynecologic cancers.  The information and recommendations are listed on the patient's comprehensive care plan/treatment summary and were reviewed in detail with the patient.    4. Health maintenance and wellness promotion: Ms. Hyslop was encouraged to consume 5-7 servings of fruits and vegetables per day. We reviewed the "Nutrition Rainbow" handout, as well as the handout "Take Control of Your Health and Reduce Your Cancer Risk" from the [place].  She was also encouraged to engage in moderate to vigorous exercise for 30 minutes per day most days of the week. We discussed the [employer] fitness program, which is designed for cancer survivors to help them become more physically fit after cancer treatments.  She was instructed to  limit her alcohol consumption and continue  to abstain from tobacco use.     5. Support services/counseling: It is not uncommon for this period of the patient's cancer care trajectory to be one of many emotions and stressors.  We discussed how this can be increasingly difficult during the times of quarantine and social distancing due to the COVID-19 pandemic.   She was given information regarding our available services and encouraged to contact me with any questions or for help enrolling in any of our support group/programs.   6. Breast lymphedema:Referred patient to PT  7.  Cognitive dysfunction screen: This is chronic and stable.  No referral to neuro onc at this time.   Follow up instructions:    -Return to cancer center for f/u with Dr. Lindi Adie in 6 months  -Mammogram due in 07/2020 -She is welcome to return back to the Survivorship Clinic at any time; no additional follow-up needed at this time.  -Consider referral back to survivorship as a long-term survivor for continued surveillance  The patient was provided an opportunity to ask questions and all were answered. The patient agreed with the plan and demonstrated an understanding of the instructions.   Total encounter time: 30 minutes*  Wilber Bihari, NP 01/09/20 10:51 AM Medical Oncology and Hematology [facility] [place], [place] [address] Tel. [phone]    Fax. [phone]  *Total Encounter Time as defined by the Centers for Medicare and Medicaid Services includes, in addition to the face-to-face time of a patient visit (documented in the note above) non-face-to-face time: obtaining and reviewing outside history, ordering and reviewing medications, tests or procedures, care coordination (communications with other health care professionals or caregivers) and documentation in the medical record.

## 2020-01-12 ENCOUNTER — Ambulatory Visit: Payer: Medicare Other | Attending: Adult Health | Admitting: Rehabilitation

## 2020-01-12 ENCOUNTER — Encounter: Payer: Self-pay | Admitting: Rehabilitation

## 2020-01-12 ENCOUNTER — Other Ambulatory Visit: Payer: Self-pay

## 2020-01-12 ENCOUNTER — Telehealth: Payer: Self-pay | Admitting: Hematology and Oncology

## 2020-01-12 DIAGNOSIS — N6489 Other specified disorders of breast: Secondary | ICD-10-CM

## 2020-01-12 DIAGNOSIS — R600 Localized edema: Secondary | ICD-10-CM | POA: Insufficient documentation

## 2020-01-12 DIAGNOSIS — Z483 Aftercare following surgery for neoplasm: Secondary | ICD-10-CM

## 2020-01-12 NOTE — Patient Instructions (Signed)
Self manual lymph drainage: Perform this sequence once a day.  Only give enough pressure no your skin to make the skin move.  Diaphragmatic - Supine   Inhale through nose making navel move out toward hands. Exhale through puckered lips, hands follow navel in. Repeat _5__ times. Rest _10__ seconds between repeats.   Copyright  VHI. All rights reserved.  Hug yourself.  Do circles at your neck just above your collarbones.  Repeat this 10 times.  1) Axilla - One at a Time   Using full weight of flat hand and fingers at center of uninvolved armpit, make _10__ in-place circles.     Now gently stretch skin from the involved side to the uninvolved side across the chest at the shoulder line.  Repeat that 4 times.              Scar Massage  Scar massage is done to improve the mobility of scar, decrease scar tissue from building up, reduce adhesions, and prevent Keloids from forming. Start scar massage after scabs have fallen off by themselves and no open areas. The first few weeks after surgery, it is normal for a scar to appear pink or red and slightly raised. Scars can itch or have areas of numbness. Some scars may be sensitive.   Direct Scar massage: after scar is healed, no opening, no scab 1.  Place pads of two fingers together directly on the scar starting at one end of the scar. Move the fingers up and down across the scar holding 5 seconds one direction.  Then go opposite direction hold 5 seconds.  2. Move over to the next section of the scar and repeat.  Work your way along the entire length of the scar.   3. Next make diagonal movements along the scar holding 5 seconds at one direction. 4. Next movement is side to side. 5. Do not rub fingers over the scar.  Instead keep firm pressure and move scar over the tissue it is on top   Scar Lift and Roll 12 weeks after surgery. 1. Pinch a small amount of the scar between your first two fingers and thumb.  2. Roll the scar  between your fingers for 5 to 15 seconds. 3. Move along the scar and repeat until you have massaged the entire length of scar.   Stop the massage and call your doctor if you notice: 1. Increased redness 2. Bleeding from scar 3. Seepage coming from the scar 4. Scar is warmer and has increased pain

## 2020-01-12 NOTE — Telephone Encounter (Signed)
Scheduled appts per 6/4 los. Pt confirmed appt date and time.

## 2020-01-12 NOTE — Therapy (Signed)
[place], [place], [address] Phone: [phone]   Fax:  [phone]  Physical Therapy Evaluation  Patient Details  Name: Brenda Delgado MRN: [id number] Date of Birth: 19-Nov-1945 Referring Provider (PT): Wilber Bihari   Encounter Date: 01/12/2020  PT End of Session - 01/12/20 2103    Visit Number  1    Number of Visits  7    Date for PT Re-Evaluation  02/06/20    PT Start Time  1300    PT Stop Time  1352    PT Time Calculation (min)  52 min    Activity Tolerance  Patient tolerated treatment well    Behavior During Therapy  [facility] for tasks assessed/performed       Past Medical History:  Diagnosis Date  . Allergy   . Anxiety   . Arthritis    right knee  . Cancer ([place]) 07/2019   left breast DCIS  . Complication of anesthesia    per pt with surgery 05-14-2019 right facial swelling and lip/ mouth blister's  . Constipation   . Depression   . Environmental and seasonal allergies   . Family history of colon cancer   . Family history of ovarian cancer   . GERD (gastroesophageal reflux disease)   . Hiatal hernia   . History of kidney stones   . Left ureteral stone   . Mild asthma   . Nephrolithiasis   . Nocturia   . PONV (postoperative nausea and vomiting)   . Pre-diabetes   . Renal calculus, left   . Wears glasses   . Wears hearing aid in both ears     Past Surgical History:  Procedure Laterality Date  . ABDOMINAL HYSTERECTOMY    . ANTERIOR CERVICAL DECOMP/DISCECTOMY FUSION  01-06-2010   @[facility]    C4--5  . BREAST LUMPECTOMY WITH RADIOACTIVE SEED LOCALIZATION Left 08/12/2019   Procedure: LEFT BREAST LUMPECTOMY WITH RADIOACTIVE SEED LOCALIZATION;  Surgeon: Alphonsa Overall, MD;  Location: [place];  Service: General;  Laterality: Left;  . CARPAL TUNNEL RELEASE Right 2000  . cataracts  2018   bilateral  . CYSTOCELE REPAIR  09/11/2011   Procedure: ANTERIOR REPAIR (CYSTOCELE);   Surgeon: Olga Millers, MD;  Location: [facility];  Service: Gynecology;  Laterality: N/A;  . CYSTOSCOPY  09/11/2011   Procedure: CYSTOSCOPY;  Surgeon: Olga Millers, MD;  Location: [facility];  Service: Gynecology;  Laterality: N/A;  . CYSTOSCOPY WITH RETROGRADE PYELOGRAM, URETEROSCOPY AND STENT PLACEMENT Left 05/28/2019   Procedure: CYSTOSCOPY WITH RETROGRADE PYELOGRAM, URETEROSCOPY AND STENT REPLACEMENT;  Surgeon: Alexis Frock, MD;  Location: [facility];  Service: Urology;  Laterality: Left;  . CYSTOSCOPY WITH STENT PLACEMENT Left 05/14/2019   Procedure: CYSTOSCOPY WITH STENT PLACEMENT, RETROGRADE;  Surgeon: Bjorn Loser, MD;  Location: [facility];  Service: Urology;  Laterality: Left;  . ELBOW SURGERY Right   . EXCISION VULVA CYST Bilateral 01-16-2005  @[facility]   . HOLMIUM LASER APPLICATION Left 56/38/7564   Procedure: HOLMIUM LASER APPLICATION;  Surgeon: Alexis Frock, MD;  Location: [facility];  Service: Urology;  Laterality: Left;  . KNEE ARTHROSCOPY Right 2005;    02-11-2013  . KNEE SURGERY Right 08-20-2012   @[facility]    peroneal nerve neurolysis proximal tibia/ fibular articular branch   . LAPAROSCOPIC CHOLECYSTECTOMY  1990s  . SACROSPINOUS LIGAMENT FIXATION  04-05-2017   @[facility]    UPHOLD vaginal mesh and  Anterior Repair  . SALPINGOOPHORECTOMY  09/11/2011  Procedure: SALPINGO OOPHERECTOMY;  Surgeon: Olga Millers, MD;  Location: [facility];  Service: Gynecology;  Laterality: Bilateral;  . SHOULDER SURGERY Right   . TONSILLECTOMY    . VAGINAL HYSTERECTOMY  09/11/2011   Procedure: HYSTERECTOMY VAGINAL;  Surgeon: Olga Millers, MD;  Location: [place] [place] [facility];  Service: Gynecology;  Laterality: N/A;  . [place] Dana Milagros Loll BIOPSY  09/11/2011   Procedure: VULVAR BIOPSY;  Surgeon: Olga Millers, MD;  Location: [place] [facility];  Service: Gynecology;  Laterality: Left;  sebaceous cyst excision left vula    There were no vitals filed for this visit.   Subjective Assessment - 01/12/20 1304     Subjective  Just the breast is swelling.  It is just annoying.  No compression bras.    Pertinent History  Left lumpectomy on 08/12/19 by Dr. [name] with no lymph nodes removed.  Radiation completed 10/07/19 and now on Tamoxifen now.  History of hysterectomy, bladder prolapse with need for fixation, Recent surgery for kidney stones    Limitations  --   none   Patient Stated Goals  get breast swelling under control    Currently in Pain?  No/denies         [facility] PT Assessment - 01/12/20 0001      Assessment   Medical Diagnosis  Left breast cancer    Referring Provider (PT)  Wilber Bihari    Onset Date/Surgical Date  08/12/19    Hand Dominance  Right    Next MD Visit  6 months    Prior Therapy  no      Restrictions   Weight Bearing Restrictions  No      Balance Screen   Has the patient fallen in the past 6 months  No    Has the patient had a decrease in activity level because of a fear of falling?   No    Is the patient reluctant to leave their home because of a fear of falling?   No      Home Film/video editor residence    Living Arrangements  Spouse/significant other      Prior Function   Level of [place]  Retired   Education officer, museum   Leisure  walking 4x per week      Cognition   Overall Cognitive Status  Within Functional Limits for tasks assessed      Observation/Other Assessments   Observations  Lt breast larger than the Rt.  well healed lumpectomy incision, waving like fluid movement with lateral pressure making swelling seem more seroma like vs lymphedema.        Coordination   Gross Motor Movements are Fluid and Coordinated  Yes      Posture/Postural Control   Posture/Postural Control  Postural limitations    Postural Limitations  Rounded Shoulders;Forward head      ROM / Strength   AROM / PROM / Strength  AROM      AROM   AROM Assessment Site  Shoulder    Right/Left Shoulder  Right;Left    Right  Shoulder Flexion  163 Degrees    Right Shoulder ABduction  167 Degrees    Right Shoulder External Rotation  70 Degrees    Left Shoulder Flexion  150 Degrees    Left Shoulder ABduction  165 Degrees   slight breast pull   Left Shoulder External Rotation  82 Degrees        LYMPHEDEMA/ONCOLOGY QUESTIONNAIRE -  01/12/20 0001      Type   Cancer Type  left breast      Surgeries   Lumpectomy Date  08/12/19    Number Lymph Nodes Removed  0      Treatment   Active Chemotherapy Treatment  No    Past Chemotherapy Treatment  No    Active Radiation Treatment  No    Past Radiation Treatment  Yes    Current Hormone Treatment  Yes      What other symptoms do you have   Are you Having Heaviness or Tightness  Yes    Are you having Pain  No      Lymphedema Assessments   Lymphedema Assessments  Upper extremities      Right Upper Extremity Lymphedema   10 cm Proximal to Olecranon Process  26 cm    Olecranon Process  22.7 cm    10 cm Proximal to Ulnar Styloid Process  19.7 cm    Just Proximal to Ulnar Styloid Process  13.2 cm    Across Hand at [employer]  18.4 cm    At [place] of 2nd Digit  5.7 cm      Left Upper Extremity Lymphedema   10 cm Proximal to Olecranon Process  26 cm    Olecranon Process  22.5 cm    10 cm Proximal to Ulnar Styloid Process  19 cm    Just Proximal to Ulnar Styloid Process  14 cm    Across Hand at [employer]  18.3 cm    At [place] of 2nd Digit  5.5 cm               Outpatient Re[facility] from [facility]12/[facility]  Lymphedema Life Impact Scale Total Score  13.24 %      Objective measurements completed on examination: See above findings.      [facility] Adult PT Treatment/Exercise - 01/12/20 0001      Exercises   Exercises  Other Exercises    Other Exercises   encouraged pt to continue left shoulder supine dowel and wall flexion from prior PT for the shoulder.  Need to give pt handout of doorway stretch and scapular  retractions.        Manual Therapy   Manual Therapy  Manual Lymphatic Drainage (MLD);Other (comment);Edema management    Edema Management  gave pt script from Dr. Lindi Adie for a compression bra with instruction on how to obtain this [facility]    Manual Lymphatic Drainage (MLD)  In seated instruction on MLD to include, breathing, SCF bil, Lt axillary nodes, then left breast and lateral trunk directing stretch towards the left axilla and then repeating axillary cirlces.  Modified due to intact lymphatic system.  Pt performed in front of the mirror with hand over hand and tcs feedback             PT Education - 01/12/20 1400    Education Details  POC, self MLD, posture    Person(s) Educated  Patient    Methods  Explanation;Demonstration;Tactile cues;Verbal cues;Handout    Comprehension  Verbalized understanding;Returned demonstration;Verbal cues required;Tactile cues required;Need further instruction          PT Long Term Goals - 01/12/20 2114      PT LONG TERM GOAL #1   Title  Pt will be independent with self care for left breast edema including self MLD and compression    Time  4  Period  Weeks    Status  New      PT LONG TERM GOAL #2   Title  Pt will report decrease in seroma size of at least 50% to decrease breast heaviness    Time  4    Period  Weeks    Status  New      PT LONG TERM GOAL #3   Title  Pt will be ind with final HEP for continued strength and mobility of the Lt UE    Time  4    Period  Weeks    Status  New             Plan - 01/12/20 2104    Clinical Impression Statement  Pt presents post left lumpectomy with no lymph nodes removed and radiation completed with left breast seroma present.  The swelling has been present since right after surgery and has not changed much in severity.  Pt has no pitting or enlarged pores and no difference in UE circumferences which would be expected due to lymph node status.  Pt does have some decreased left  shoulder AROM with some mild pulling into the lateral breast with overhead flexion but pt also had recent PT visits for left shoulder pain and weakness and reports that is better than it used to be.  Pt will get a compression bra as soon as possible and attempt self MLD 2x per day to see if there is any difference in edema as well as start some in clinic MLD.    Personal Factors and Comorbidities  Age;Comorbidity 1    Comorbidities  radiation    Stability/Clinical Decision Making  Stable/Uncomplicated    Clinical Decision Making  Low    Rehab Potential  Excellent    PT Frequency  2x / week    PT Duration  4 weeks    PT Treatment/Interventions  ADLs/Self Care Home Management;Therapeutic exercise;Manual lymph drainage;Scar mobilization;Passive range of motion;Manual techniques;Taping    PT Next Visit Plan  give printout of doorway, retraction, wall flexion, supine dowel, any changes with compression bra and self MLD? Any follow up from MDs? start left breast MLD for seroma can use Lt intact axillary nodes    PT Home Exercise Plan  self MLD    Consulted and Agree with Plan of Care  Patient       Patient will benefit from skilled therapeutic intervention in order to improve the following deficits and impairments:  Decreased range of motion, Increased edema  Visit Diagnosis: Seroma of breast  Localized edema  Aftercare following surgery for neoplasm     Problem List Patient Active Problem List   Diagnosis Date Noted  . Genetic testing 08/13/2019  . Family history of ovarian cancer   . Family history of colon cancer   . Ductal carcinoma in situ (DCIS) of left breast 07/28/2019  . History of elevated glucose 07/22/2019  . Elevated LDL cholesterol level 07/22/2019  . Tremor 07/22/2019  . Left ureteral stone 05/14/2019  . METATARSALGIA 04/15/2008  . FOOT PAIN, BILATERAL 04/15/2008    Shan Levans, PT 01/12/2020, 9:18 PM  [place], [place], [address] Phone: [phone]   Fax:  [phone]  Name: CARLICIA LEAVENS MRN: [id number] Date of Birth: 27-Jun-1946

## 2020-01-13 DIAGNOSIS — C50912 Malignant neoplasm of unspecified site of left female breast: Secondary | ICD-10-CM | POA: Diagnosis not present

## 2020-01-19 DIAGNOSIS — J301 Allergic rhinitis due to pollen: Secondary | ICD-10-CM | POA: Diagnosis not present

## 2020-01-19 DIAGNOSIS — J3081 Allergic rhinitis due to animal (cat) (dog) hair and dander: Secondary | ICD-10-CM | POA: Diagnosis not present

## 2020-01-19 DIAGNOSIS — J3089 Other allergic rhinitis: Secondary | ICD-10-CM | POA: Diagnosis not present

## 2020-01-20 ENCOUNTER — Ambulatory Visit: Payer: Medicare Other | Admitting: Family Medicine

## 2020-01-23 ENCOUNTER — Ambulatory Visit: Payer: Medicare Other | Admitting: Rehabilitation

## 2020-01-27 ENCOUNTER — Other Ambulatory Visit: Payer: Self-pay

## 2020-01-27 ENCOUNTER — Ambulatory Visit: Payer: Medicare Other

## 2020-01-27 DIAGNOSIS — Z483 Aftercare following surgery for neoplasm: Secondary | ICD-10-CM | POA: Diagnosis not present

## 2020-01-27 DIAGNOSIS — N6489 Other specified disorders of breast: Secondary | ICD-10-CM

## 2020-01-27 DIAGNOSIS — R600 Localized edema: Secondary | ICD-10-CM | POA: Diagnosis not present

## 2020-01-27 NOTE — Therapy (Signed)
[place], [place], [address] Phone: [phone]   Fax:  [phone]  Physical Therapy Treatment  Patient Details  Name: Brenda Delgado MRN: [id number] Date of Birth: [birth year] Referring Provider (PT): Wilber Bihari   Encounter Date: 01/27/2020   PT End of Session - 01/27/20 1005    Visit Number 2    Number of Visits 7    Date for PT Re-Evaluation 02/06/20    PT Start Time 0907    PT Stop Time 1004    PT Time Calculation (min) 57 min    Activity Tolerance Patient tolerated treatment well    Behavior During Therapy [facility] for tasks assessed/performed           Past Medical History:  Diagnosis Date  . Allergy   . Anxiety   . Arthritis    right knee  . Cancer ([place]) 07/2019   left breast DCIS  . Complication of anesthesia    per pt with surgery 05-14-2019 right facial swelling and lip/ mouth blister's  . Constipation   . Depression   . Environmental and seasonal allergies   . Family history of colon cancer   . Family history of ovarian cancer   . GERD (gastroesophageal reflux disease)   . Hiatal hernia   . History of kidney stones   . Left ureteral stone   . Mild asthma   . Nephrolithiasis   . Nocturia   . PONV (postoperative nausea and vomiting)   . Pre-diabetes   . Renal calculus, left   . Wears glasses   . Wears hearing aid in both ears     Past Surgical History:  Procedure Laterality Date  . ABDOMINAL HYSTERECTOMY    . ANTERIOR CERVICAL DECOMP/DISCECTOMY FUSION  01-06-2010   @[facility]    C4--5  . BREAST LUMPECTOMY WITH RADIOACTIVE SEED LOCALIZATION Left 08/12/2019   Procedure: LEFT BREAST LUMPECTOMY WITH RADIOACTIVE SEED LOCALIZATION;  Surgeon: Alphonsa Overall, MD;  Location: [place];  Service: General;  Laterality: Left;  . CARPAL TUNNEL RELEASE Right 2000  . cataracts  2018   bilateral  . CYSTOCELE REPAIR  09/11/2011   Procedure: ANTERIOR REPAIR (CYSTOCELE);   Surgeon: Olga Millers, MD;  Location: [facility];  Service: Gynecology;  Laterality: N/A;  . CYSTOSCOPY  09/11/2011   Procedure: CYSTOSCOPY;  Surgeon: Olga Millers, MD;  Location: [facility];  Service: Gynecology;  Laterality: N/A;  . CYSTOSCOPY WITH RETROGRADE PYELOGRAM, URETEROSCOPY AND STENT PLACEMENT Left 05/28/2019   Procedure: CYSTOSCOPY WITH RETROGRADE PYELOGRAM, URETEROSCOPY AND STENT REPLACEMENT;  Surgeon: Alexis Frock, MD;  Location: [facility];  Service: Urology;  Laterality: Left;  . CYSTOSCOPY WITH STENT PLACEMENT Left 05/14/2019   Procedure: CYSTOSCOPY WITH STENT PLACEMENT, RETROGRADE;  Surgeon: Bjorn Loser, MD;  Location: [facility];  Service: Urology;  Laterality: Left;  . ELBOW SURGERY Right   . EXCISION VULVA CYST Bilateral 01-16-2005  @[facility]   . HOLMIUM LASER APPLICATION Left 40/81/4481   Procedure: HOLMIUM LASER APPLICATION;  Surgeon: Alexis Frock, MD;  Location: [facility];  Service: Urology;  Laterality: Left;  . KNEE ARTHROSCOPY Right 2005;    02-11-2013  . KNEE SURGERY Right 08-20-2012   @[facility]    peroneal nerve neurolysis proximal tibia/ fibular articular branch   . LAPAROSCOPIC CHOLECYSTECTOMY  1990s  . SACROSPINOUS LIGAMENT FIXATION  04-05-2017   @[facility]    UPHOLD vaginal mesh and  Anterior Repair  . SALPINGOOPHORECTOMY  09/11/2011   Procedure: SALPINGO OOPHERECTOMY;  Surgeon: Olga Millers, MD;  Location: [facility];  Service: Gynecology;  Laterality: Bilateral;  . SHOULDER SURGERY Right   . TONSILLECTOMY    . VAGINAL HYSTERECTOMY  09/11/2011   Procedure: HYSTERECTOMY VAGINAL;  Surgeon: Olga Millers, MD;  Location: [facility];  Service: Gynecology;  Laterality: N/A;  . [place] Dana Milagros Loll BIOPSY  09/11/2011   Procedure: VULVAR BIOPSY;  Surgeon: Olga Millers, MD;  Location: [facility];  Service: Gynecology;  Laterality: Left;  sebaceous cyst excision left vula    There were no vitals filed for this visit.   Subjective Assessment - 01/27/20 0913     Subjective I got my compression bra shortly after I was here last and have been wearing it every day now. Not sure if I can tell a difference but my husbnad says it looks a little smaller to him. I'm doing the stretches every day and I think my reaching is starting to improve.    Pertinent History Left lumpectomy on 08/12/19 by Dr. [name] with no lymph nodes removed.  Radiation completed 10/07/19 and now on Tamoxifen now.  History of hysterectomy, bladder prolapse with need for fixation, Recent surgery for kidney stones    Patient Stated Goals get breast swelling under control    Currently in Pain? No/denies                       Outpatient [facility]  Lymphedema Life Impact Scale Total Score 13.24 %            [facility] Adult PT Treatment/Exercise - 01/27/20 0001      Exercises   Other Exercises  Briefly reviewed HEP she is still doing from shoulder PT a few months ago, also issued doorway pectoralis stretch and scapular retraction      Manual Therapy   Manual Therapy Myofascial release;Manual Lymphatic Drainage (MLD);Passive ROM    Myofascial Release To Lt axilla gently during P/ROM    Manual Lymphatic Drainage (MLD) In Supine: Short neck and 5 diaphragamatic breaths, bil axillary and Lt inguinal nodes, then, as time allowed today did more advance treatment of intact lymphatic tissue to further promote fluid flow, anterior inter-axillary and Lt axillo-inguinal anastomosis, then focused on Rt breast mostly in Rt S/L for lateral aspect of breast over area where seroma was per pt report but not palpable today, just very mild edema at lateral inferior aspect of breast. Reviewed correct light pressure with pt while performing; then finished retracing all steps in supine    Passive ROM In Supine to Lt shoulder into flexion, abduction, er (painful so limited ROM), and D2 to tolerance                  PT Education -  01/27/20 1004    Education Details Scapular retraction and doorway pectoralis stretch, then to cont wall slides and supine dowel flexion as she has been doing since RTC surgery    Person(s) Educated Patient    Methods Explanation;Demonstration;Handout    Comprehension Verbalized understanding               PT Long Term Goals - 01/12/20 2114      PT LONG TERM GOAL #1   Title Pt will be independent with self care for left breast edema including self MLD and compression    Time 4    Period Weeks    Status New      PT LONG TERM  GOAL #2   Title Pt will report decrease in seroma size of at least 50% to decrease breast heaviness    Time 4    Period Weeks    Status New      PT LONG TERM GOAL #3   Title Pt will be ind with final HEP for continued strength and mobility of the Lt UE    Time 4    Period Weeks    Status New                 Plan - 01/27/20 1139    Clinical Impression Statement Pt returns after 2 weeks of reported compliance with self manual lymph drainage and HEP stretching and has tremendous reduction of seroma. It was not palpable today with pt in supine or Rt S/L and she reports did not even notice it being gone until put into S/L. Encouraged pt to continue performing self MLD focusing on mild palpable edema at lateral inferior aspect of breast. Also to cont ROM HEP for Lt shoulder. She verbalized understanding. She also did get compression bra shortly after last visit and has been wearing daily except for today as she reports was trying to get to appt and did not have time to put it on.    Personal Factors and Comorbidities Age;Comorbidity 1    Comorbidities radiation    Stability/Clinical Decision Making Stable/Uncomplicated    Rehab Potential Excellent    PT Frequency 2x / week    PT Duration 4 weeks    PT Treatment/Interventions ADLs/Self Care Home Management;Therapeutic exercise;Manual lymph drainage;Scar mobilization;Passive range of motion;Manual  techniques;Taping    PT Next Visit Plan Review HEP prn and progres to supine scapular series if tolerable for Lt shoulder; cont MLD and determine if she needs to cont this daily; Any follow up from MDs? can use Lt intact axillary nodes for MLD    PT Home Exercise Plan self MLD; Lt shoulder ROM stretching    Consulted and Agree with Plan of Care Patient           Patient will benefit from skilled therapeutic intervention in order to improve the following deficits and impairments:  Decreased range of motion, Increased edema  Visit Diagnosis: Seroma of breast  Localized edema  Aftercare following surgery for neoplasm     Problem List Patient Active Problem List   Diagnosis Date Noted  . Genetic testing 08/13/2019  . Family history of ovarian cancer   . Family history of colon cancer   . Ductal carcinoma in situ (DCIS) of left breast 07/28/2019  . History of elevated glucose 07/22/2019  . Elevated LDL cholesterol level 07/22/2019  . Tremor 07/22/2019  . Left ureteral stone 05/14/2019  . METATARSALGIA 04/15/2008  . FOOT PAIN, BILATERAL 04/15/2008    Otelia Limes, PTA 01/27/2020, 11:46 AM  [place], [place], [address] Phone: [phone]   Fax:  [phone]  Name: Brenda Delgado MRN: [id number] Date of Birth: October 29, 1945

## 2020-01-27 NOTE — Patient Instructions (Signed)
Scapular Retraction: Elbow Flexion (Standing)    With elbows bent to 90, pinch shoulder blades together and rotate arms out, keeping elbows bent. Repeat _10___ times per set. Do __1__ sets per session. Do __2-3__ sessions per day.  CHEST: Doorway, Bilateral - Standing    Standing in doorway with one foot in front of the other, place hands on wall with elbows bent at shoulder height. Lean forward. Hold _20-30__ seconds. _3__ reps per set, _3__ sets per day  Cancer Rehab [phone]

## 2020-01-29 ENCOUNTER — Ambulatory Visit: Payer: Medicare Other

## 2020-01-29 ENCOUNTER — Other Ambulatory Visit: Payer: Self-pay

## 2020-01-29 DIAGNOSIS — Z483 Aftercare following surgery for neoplasm: Secondary | ICD-10-CM | POA: Diagnosis not present

## 2020-01-29 DIAGNOSIS — R600 Localized edema: Secondary | ICD-10-CM | POA: Diagnosis not present

## 2020-01-29 DIAGNOSIS — N6489 Other specified disorders of breast: Secondary | ICD-10-CM

## 2020-01-29 NOTE — Therapy (Signed)
[place], [place], [address] Phone: [phone]   Fax:  [phone]  Physical Therapy Treatment  Patient Details  Name: Brenda Delgado MRN: [id number] Date of Birth: 27-Dec-1945 Referring Provider (PT): Wilber Bihari   Encounter Date: 01/29/2020   PT End of Session - 01/29/20 1357    Visit Number 3    Number of Visits 7    Date for PT Re-Evaluation 02/06/20    PT Start Time 0272    PT Stop Time 1452    PT Time Calculation (min) 55 min    Activity Tolerance Patient tolerated treatment well    Behavior During Therapy [facility] for tasks assessed/performed           Past Medical History:  Diagnosis Date  . Allergy   . Anxiety   . Arthritis    right knee  . Cancer ([place]) 07/2019   left breast DCIS  . Complication of anesthesia    per pt with surgery 05-14-2019 right facial swelling and lip/ mouth blister's  . Constipation   . Depression   . Environmental and seasonal allergies   . Family history of colon cancer   . Family history of ovarian cancer   . GERD (gastroesophageal reflux disease)   . Hiatal hernia   . History of kidney stones   . Left ureteral stone   . Mild asthma   . Nephrolithiasis   . Nocturia   . PONV (postoperative nausea and vomiting)   . Pre-diabetes   . Renal calculus, left   . Wears glasses   . Wears hearing aid in both ears     Past Surgical History:  Procedure Laterality Date  . ABDOMINAL HYSTERECTOMY    . ANTERIOR CERVICAL DECOMP/DISCECTOMY FUSION  01-06-2010   @[facility]    C4--5  . BREAST LUMPECTOMY WITH RADIOACTIVE SEED LOCALIZATION Left 08/12/2019   Procedure: LEFT BREAST LUMPECTOMY WITH RADIOACTIVE SEED LOCALIZATION;  Surgeon: Alphonsa Overall, MD;  Location: [facility];  Service: General;  Laterality: Left;  . CARPAL TUNNEL RELEASE Right 2000  . cataracts  2018   bilateral  . CYSTOCELE REPAIR  09/11/2011   Procedure: ANTERIOR REPAIR (CYSTOCELE);   Surgeon: Olga Millers, MD;  Location: [facility];  Service: Gynecology;  Laterality: N/A;  . CYSTOSCOPY  09/11/2011   Procedure: CYSTOSCOPY;  Surgeon: Olga Millers, MD;  Location: [facility];  Service: Gynecology;  Laterality: N/A;  . CYSTOSCOPY WITH RETROGRADE PYELOGRAM, URETEROSCOPY AND STENT PLACEMENT Left 05/28/2019   Procedure: CYSTOSCOPY WITH RETROGRADE PYELOGRAM, URETEROSCOPY AND STENT REPLACEMENT;  Surgeon: Alexis Frock, MD;  [facility];  Service: Urology;  Laterality: Left;  . CYSTOSCOPY WITH STENT PLACEMENT Left 05/14/2019   Procedure: CYSTOSCOPY WITH STENT PLACEMENT, RETROGRADE;  Surgeon: Bjorn Loser, MD;  Location: [facility];  Service: Urology;  Laterality: Left;  . ELBOW SURGERY Right   . EXCISION VULVA CYST Bilateral 01-16-2005  @[facility]   . HOLMIUM LASER APPLICATION Left 53/66/4403   Procedure: HOLMIUM LASER APPLICATION;  Surgeon: Alexis Frock, MD;  Location: [facility];  Service: Urology;  Laterality: Left;  . KNEE ARTHROSCOPY Right 2005;    02-11-2013  . KNEE SURGERY Right 08-20-2012   @[facility]    peroneal nerve neurolysis proximal tibia/ fibular articular branch   . LAPAROSCOPIC CHOLECYSTECTOMY  1990s  . SACROSPINOUS LIGAMENT FIXATION  04-05-2017   @[facility]    UPHOLD vaginal mesh and  Anterior Repair  . SALPINGOOPHORECTOMY  09/11/2011   Procedure: SALPINGO OOPHERECTOMY;  Surgeon: Olga Millers, MD;  Location: [facility];  Service: Gynecology;  Laterality: Bilateral;  . SHOULDER SURGERY Right   . TONSILLECTOMY    . VAGINAL HYSTERECTOMY  09/11/2011   Procedure: HYSTERECTOMY VAGINAL;  Surgeon: Olga Millers, MD;  Location: [facility];  Service: Gynecology;  Laterality: N/A;  . Brenda Delgado BIOPSY  09/11/2011   Procedure: VULVAR BIOPSY;  Surgeon: Olga Millers, MD;  Location: [facility];  Service: Gynecology;  Laterality: Left;  sebaceous cyst excision left vula    There were no vitals filed for this visit.   Subjective Assessment - 01/29/20 1357     Subjective Pt states that her swelling is doing better. She continues to wear the compression bra. Pt reports dull pain every once in a while below the incision on the lateral aspect of her L breast.    Pertinent History Left lumpectomy on 08/12/19 by Dr. [name] with no lymph nodes removed.  Radiation completed 10/07/19 and now on Tamoxifen now.  History of hysterectomy, bladder prolapse with need for fixation, Recent surgery for kidney stones    Patient Stated Goals get breast swelling under control    Currently in Pain? No/denies    Pain Score 0-No pain                       Outpatient Rehab from 01/12/2020 in [place]  Lymphedema Life Impact Scale Total Score 13.24 %            [facility] Adult PT Treatment/Exercise - 01/29/20 0001      Exercises   Exercises Shoulder      Shoulder Exercises: Supine   Horizontal ABduction Strengthening;Both;10 reps    Theraband Level (Shoulder Horizontal ABduction) Level 1 (Yellow)    Horizontal ABduction Limitations Modified to sitting due to the wall was in the way in the clinic room 10x VC and demonstration for correct movement and scapular squeeze     External Rotation Strengthening;Both;10 reps    Theraband Level (Shoulder External Rotation) Level 1 (Yellow)    External Rotation Limitations Demonstratoin and VC for keeping elbows tucked and squeezing shoulder blades at end range.     Flexion Strengthening;Both;10 reps    Theraband Level (Shoulder Flexion) Level 1 (Yellow)    Flexion Limitations Demonstration and VC to avoid pain as well as maintain tension throughout movement on theraband     Diagonals Strengthening;Left;10 reps    Theraband Level (Shoulder Diagonals) Level 1 (Yellow)    Diagonals Limitations Demonstration and tactlie cueing for correct movement with VC to avoid pain.       Manual Therapy   Manual Therapy Myofascial release;Manual Lymphatic Drainage (MLD);Passive ROM;Edema  management    Edema Management 1/2 inch gray foam for the L lateral inferior breast over incision area to facilitate greater fluid flow from that area    Myofascial Release to the inferior L breast over incision easy cross fricion and then cross hand myofascial release along the L lateral trunk.     Manual Lymphatic Drainage (MLD) In supine: short neck, swimming in the terminus, bil shoulder collectors, bil axillary and L inguinal nodes, anterior interaxillary and L axillo-inguinal anastomosis, superior/inferior breast toward corressponding anastomosis then re-worked anasotmosis, in R side-lying re-worked L axillo-inguinal anastomosis, extra time spent at inferior breast then posterior inter-axillary anastomosis, re-worked L axillo-inguinal anastomosis then anterior inter-axillary anastomosis in supine; deep abdominals.     Passive ROM In supine into flexion and abduction with good mobility  and easy end range stretch 5x each direction.                   PT Education - 01/29/20 1445    Education Details Pt will continue with stretching and will start working on the supine scapular exercises at home with yellow theraband that she was provided with in the clinic today.    Person(s) Educated Patient    Methods Explanation;Demonstration;Verbal cues;Handout;Tactile cues    Comprehension Verbalized understanding;Returned demonstration               PT Long Term Goals - 01/12/20 2114      PT LONG TERM GOAL #1   Title Pt will be independent with self care for left breast edema including self MLD and compression    Time 4    Period Weeks    Status New      PT LONG TERM GOAL #2   Title Pt will report decrease in seroma size of at least 50% to decrease breast heaviness    Time 4    Period Weeks    Status New      PT LONG TERM GOAL #3   Title Pt will be ind with final HEP for continued strength and mobility of the Lt UE    Time 4    Period Weeks    Status New                  Plan - 01/29/20 1356    Clinical Impression Statement Pt presents to physical therapy with continues edema in her L breast and lateral L upper quadrant; slight improvement following MLD with pt in supine and R side-lying with extra time spent on the L lateral/inferior breast. Pt was able to tolerate an increase in resistance with ther-ex this session w/o an increase in pain in her L shoulder due to previous Rotator cuff surgery. Pt educated to make sure that her pain is not increasing and to give herself rest days with just stretching if she has significant muscle soreness. Myofascial release was perofrmed over the incision on the L breast and along the L lateral trunk to help break up adhesions to assist in fluid flow out of the L breast and upper quadrant as well as provided with a piece of 1/2 inch gray foam. Pt will benefit from continued POC at this time.    Personal Factors and Comorbidities Age;Comorbidity 1    Comorbidities radiation    Rehab Potential Excellent    PT Frequency 2x / week    PT Duration 4 weeks    PT Treatment/Interventions ADLs/Self Care Home Management;Therapeutic exercise;Manual lymph drainage;Scar mobilization;Passive range of motion;Manual techniques;Taping    PT Next Visit Plan assess scapular series; cont MLD and determine if she needs to cont this daily; Any follow up from MDs? can use Lt intact axillary nodes for MLD    PT Home Exercise Plan self MLD; Lt shoulder ROM stretching    Consulted and Agree with Plan of Care Patient           Patient will benefit from skilled therapeutic intervention in order to improve the following deficits and impairments:  Decreased range of motion, Increased edema  Visit Diagnosis: Seroma of breast  Localized edema  Aftercare following surgery for neoplasm     Problem List Patient Active Problem List   Diagnosis Date Noted  . Genetic testing 08/13/2019  . Family history of ovarian cancer   . Family  history of colon cancer   . Ductal carcinoma in situ (DCIS) of left breast 07/28/2019  . History of elevated glucose 07/22/2019  . Elevated LDL cholesterol level 07/22/2019  . Tremor 07/22/2019  . Left ureteral stone 05/14/2019  . METATARSALGIA 04/15/2008  . FOOT PAIN, BILATERAL 04/15/2008    Ander Purpura, PT 01/29/2020, 3:02 PM  [place], [place], [address] Phone: [phone]   Fax:  [phone]  Name: Brenda Delgado MRN: [id number] Date of Birth: 12/29/1945

## 2020-01-29 NOTE — Patient Instructions (Signed)

## 2020-02-02 ENCOUNTER — Ambulatory Visit (INDEPENDENT_AMBULATORY_CARE_PROVIDER_SITE_OTHER): Payer: BC Managed Care – PPO | Admitting: Family Medicine

## 2020-02-02 ENCOUNTER — Encounter: Payer: Self-pay | Admitting: Family Medicine

## 2020-02-02 ENCOUNTER — Other Ambulatory Visit: Payer: Self-pay

## 2020-02-02 VITALS — BP 116/68 | HR 66 | Temp 98.6°F | Ht 59.0 in | Wt 114.8 lb

## 2020-02-02 DIAGNOSIS — R4189 Other symptoms and signs involving cognitive functions and awareness: Secondary | ICD-10-CM | POA: Insufficient documentation

## 2020-02-02 DIAGNOSIS — R251 Tremor, unspecified: Secondary | ICD-10-CM | POA: Diagnosis not present

## 2020-02-02 DIAGNOSIS — S0031XA Abrasion of nose, initial encounter: Secondary | ICD-10-CM

## 2020-02-02 DIAGNOSIS — R413 Other amnesia: Secondary | ICD-10-CM | POA: Diagnosis not present

## 2020-02-02 MED ORDER — MUPIROCIN 2 % EX OINT: 1.0000 "application " | TOPICAL_OINTMENT | Freq: Two times a day (BID) | CUTANEOUS | 0 refills | Status: DC

## 2020-02-02 NOTE — Progress Notes (Signed)
Established Patient Office Visit  Subjective:  Patient ID: Brenda Delgado, female    DOB: May 04, 1946  Age: 74 y.o. MRN: [id number]  CC:  Chief Complaint  Patient presents with  . Follow-up    6 month follow up, patient has concerns about head and hands very shaky becomig mroe frequent.     HPI CHANNA HAZELETT presents for longstanding history of tremor involving her head and hands.  More recently she seems to have had some problems with her memory.  Has been forgetting little things here and there.  Her husband is not particularly concerned.  She does not get lost.  There is no wandering.  She is unaware of any inappropriate behavior.  She is heading towards the [place] end of the breast lumpectomy with follow-up radiation treatment.  Everything seems to be going well there.  She is on tamoxifen.  Past Medical History:  Diagnosis Date  . Allergy   . Anxiety   . Arthritis    right knee  . Cancer ([place]) 07/2019   left breast DCIS  . Complication of anesthesia    per pt with surgery 05-14-2019 right facial swelling and lip/ mouth blister's  . Constipation   . Depression   . Environmental and seasonal allergies   . Family history of colon cancer   . Family history of ovarian cancer   . GERD (gastroesophageal reflux disease)   . Hiatal hernia   . History of kidney stones   . Left ureteral stone   . Mild asthma   . Nephrolithiasis   . Nocturia   . PONV (postoperative nausea and vomiting)   . Pre-diabetes   . Renal calculus, left   . Wears glasses   . Wears hearing aid in both ears     Past Surgical History:  Procedure Laterality Date  . ABDOMINAL HYSTERECTOMY    . ANTERIOR CERVICAL DECOMP/DISCECTOMY FUSION  01-06-2010   @[facility]    C4--5  . BREAST LUMPECTOMY WITH RADIOACTIVE SEED LOCALIZATION Left 08/12/2019   Procedure: LEFT BREAST LUMPECTOMY WITH RADIOACTIVE SEED LOCALIZATION;  Surgeon: Alphonsa Overall, MD;  Location: [facility];  Service: General;  Laterality: Left;    . CARPAL TUNNEL RELEASE Right 2000  . cataracts  2018   bilateral  . CYSTOCELE REPAIR  09/11/2011   Procedure: ANTERIOR REPAIR (CYSTOCELE);  Surgeon: Olga Millers, MD;  Location: [facility];  Service: Gynecology;  Laterality: N/A;  . CYSTOSCOPY  09/11/2011   Procedure: CYSTOSCOPY;  Surgeon: Olga Millers, MD;  Location: [facility];  Service: Gynecology;  Laterality: N/A;  . CYSTOSCOPY WITH RETROGRADE PYELOGRAM, URETEROSCOPY AND STENT PLACEMENT Left 05/28/2019   Procedure: CYSTOSCOPY WITH RETROGRADE PYELOGRAM, URETEROSCOPY AND STENT REPLACEMENT;  Surgeon: Alexis Frock, MD;  [facility];  Service: Urology;  Laterality: Left;  . CYSTOSCOPY WITH STENT PLACEMENT Left 05/14/2019   Procedure: CYSTOSCOPY WITH STENT PLACEMENT, RETROGRADE;  Surgeon: Bjorn Loser, MD;  Location: [facility];  Service: Urology;  Laterality: Left;  . ELBOW SURGERY Right   . EXCISION VULVA CYST Bilateral 01-16-2005  @[facility]   . HOLMIUM LASER APPLICATION Left 85/46/2703   Procedure: HOLMIUM LASER APPLICATION;  Surgeon: Alexis Frock, MD;  [facility];  Service: Urology;  Laterality: Left;  . KNEE ARTHROSCOPY Right 2005;    02-11-2013  . KNEE SURGERY Right 08-20-2012   @[facility]    peroneal nerve neurolysis proximal tibia/ fibular articular branch   . LAPAROSCOPIC CHOLECYSTECTOMY  1990s  . SACROSPINOUS LIGAMENT FIXATION  04-05-2017   @[facility]    UPHOLD vaginal mesh and  Anterior Repair  . SALPINGOOPHORECTOMY  09/11/2011   Procedure: SALPINGO OOPHERECTOMY;  Surgeon: Olga Millers, MD;  Location: [facility];  Service: Gynecology;  Laterality: Bilateral;  . SHOULDER SURGERY Right   . TONSILLECTOMY    . VAGINAL HYSTERECTOMY  09/11/2011   Procedure: HYSTERECTOMY VAGINAL;  Surgeon: Olga Millers, MD;  Location: [facility];  Service: Gynecology;  Laterality: N/A;  . [place] Milagros Loll BIOPSY  09/11/2011   Procedure: VULVAR BIOPSY;  Surgeon: Olga Millers, MD;  Location: [facility];  Service: Gynecology;   Laterality: Left;  sebaceous cyst excision left vula    Family History  Problem Relation Age of Onset  . Skin cancer Mother   . Colon cancer Paternal Uncle        dx late 55s-60s  . Ovarian cancer Paternal Aunt 64  . Cancer Cousin        unk type    Social History   Socioeconomic History  . Marital status: Married    Spouse name: Not on file  . Number of children: Not on file  . Years of education: Not on file  . Highest education level: Not on file  Occupational History  . Not on file  Tobacco Use  . Smoking status: Never Smoker  . Smokeless tobacco: Never Used  Vaping Use  . Vaping Use: Never used  Substance and Sexual Activity  . Alcohol use: Yes    Comment: occasional  . Drug use: Never  . Sexual activity: Not on file  Other Topics Concern  . Not on file  Social History Narrative  . Not on file   Social Determinants of Health   Financial Resource Strain:   . Difficulty of Paying Living Expenses:   Food Insecurity:   . Worried About Charity fundraiser in the Last Year:   . Arboriculturist in the Last Year:   Transportation Needs:   . Film/video editor (Medical):   Marland Kitchen Lack of Transportation (Non-Medical):   Physical Activity:   . Days of Exercise per Week:   . Minutes of Exercise per Session:   Stress:   . Feeling of Stress :   Social Connections:   . Frequency of Communication with Friends and Family:   . Frequency of Social Gatherings with Friends and Family:   . Attends Religious Services:   . Active Member of Clubs or Organizations:   . Attends Archivist Meetings:   Marland Kitchen Marital Status:   Intimate Partner Violence:   . Fear of Current or Ex-Partner:   . Emotionally Abused:   Marland Kitchen Physically Abused:   . Sexually Abused:     Outpatient Medications Prior to Visit  Medication Sig Dispense Refill  . ALBUTEROL IN Inhale into the lungs as needed.    Marland Kitchen aspirin EC 81 MG tablet Take 81 mg by mouth daily.    . Calcium-Vitamin D-Vitamin K  500-500-40 MG-UNT-MCG CHEW Chew 2 tablets by mouth daily.    . Cholecalciferol (VITAMIN D3) 50 MCG (2000 UT) TABS Take 50 mcg by mouth 2 (two) times daily.    . Coenzyme Q10 (COQ10 PO) Take 1 capsule by mouth daily.    Marland Kitchen EPINEPHrine 0.3 mg/0.3 mL IJ SOAJ injection SMARTSIG:1 Pre-Filled Pen Syringe IM As Needed    . pantoprazole (PROTONIX) 40 MG tablet Take 1 tablet (40 mg total) by mouth at bedtime. 90 tablet 3  . PARoxetine (PAXIL) 20  MG tablet Take 20 mg by mouth daily.    . tamoxifen (NOLVADEX) 10 MG tablet Take 1 tablet (10 mg total) by mouth daily. 90 tablet 3   No facility-administered medications prior to visit.    Allergies  Allergen Reactions  . Pravastatin     Other reaction(s): Myalgias (intolerance) 2016  . Erythromycin Nausea And Vomiting    Cramps   . Iohexol Hives     Code: HIVES, Desc: Pt's husband called when they got home from pt's CEPI #1.  Pt w/ hives/itching.  Instructed on taking Benadryl now, 1hr and q6hrs prn.  Instructed to add IV Contrast to allergy list, esp for CT scans, and to get pre-med info in the future.  jkl, Onset Date: [id number]   . Amoxicillin-Pot Clavulanate Other (See Comments)    GI Upset    . Codeine Nausea And Vomiting  . Moxifloxacin Itching  . Sulfamethoxazole Itching and Rash    ROS Review of Systems  Constitutional: Negative.   HENT: Negative.  Negative for nosebleeds and postnasal drip.   Respiratory: Negative.   Cardiovascular: Negative.   Gastrointestinal: Negative.   Genitourinary: Negative.   Neurological: Positive for tremors. Negative for seizures, weakness, light-headedness, numbness and headaches.  Psychiatric/Behavioral: Negative for agitation, behavioral problems, confusion and decreased concentration.      Objective:    Physical Exam Vitals and nursing note reviewed.  Constitutional:      General: She is not in acute distress.    Appearance: Normal appearance. She is not ill-appearing, toxic-appearing or  diaphoretic.  HENT:     Head: Normocephalic and atraumatic.     Right Ear: External ear normal.     Left Ear: External ear normal.     Nose: No congestion.     Mouth/Throat:     Mouth: Mucous membranes are moist.     Pharynx: Oropharynx is clear. No oropharyngeal exudate or posterior oropharyngeal erythema.  Eyes:     General: No scleral icterus.       Right eye: No discharge.        Left eye: No discharge.     Extraocular Movements: Extraocular movements intact.     Conjunctiva/sclera: Conjunctivae normal.  Cardiovascular:     Rate and Rhythm: Normal rate and regular rhythm.  Pulmonary:     Effort: Pulmonary effort is normal.     Breath sounds: Normal breath sounds.  Skin:    General: Skin is warm and dry.  Neurological:     Mental Status: She is alert and oriented to person, place, and time.     Motor: Tremor present. No weakness, atrophy, abnormal muscle tone, seizure activity or pronator drift.     Coordination: Romberg sign negative. Coordination normal. Finger-Nose-Finger Test normal.     Gait: Gait normal.     Comments: Memory recall was 2 out of 3.  Was able to draw clock face at 6:50 perfectly.  Observed gait was quite normal.  Resting tremor in both hands noted.  Head tremor did not start until after she attempted to perform finger-nose testing.  She did quite well with finger-to-nose testing.  Psychiatric:        Mood and Affect: Mood normal.        Behavior: Behavior normal.     BP 116/68   Pulse 66   Temp 98.6 F (37 C) (Tympanic)   Ht 4\' 11"  (1.499 m)   Wt 114 lb 12.8 oz (52.1 kg)   SpO2 97%   BMI  23.19 kg/m  Wt Readings from Last 3 Encounters:  02/02/20 114 lb 12.8 oz (52.1 kg)  01/09/20 115 lb 6.4 oz (52.3 kg)  10/06/19 116 lb 14.4 oz (53 kg)     Health Maintenance Due  Topic Date Due  . Hepatitis C Screening  Never done  . COVID-19 Vaccine (1) Never done  . MAMMOGRAM  Never done  . COLONOSCOPY  Never done  . DEXA SCAN  Never done  .  TETANUS/TDAP  02/16/2018    There are no preventive care reminders to display for this patient.  Lab Results  Component Value Date   TSH 1.30 07/22/2019   Lab Results  Component Value Date   WBC 4.0 07/30/2019   HGB 13.9 07/30/2019   HCT 43.9 07/30/2019   MCV 92.2 07/30/2019   PLT 305 07/30/2019   Lab Results  Component Value Date   NA 143 07/30/2019   K 4.4 07/30/2019   CO2 30 07/30/2019   GLUCOSE 97 07/30/2019   BUN 16 07/30/2019   CREATININE 0.67 07/30/2019   BILITOT 0.4 07/30/2019   ALKPHOS 72 07/30/2019   AST 18 07/30/2019   ALT 12 07/30/2019   PROT 6.5 07/30/2019   ALBUMIN 3.9 07/30/2019   CALCIUM 8.9 07/30/2019   ANIONGAP 9 07/30/2019   GFR 96.17 07/22/2019   Lab Results  Component Value Date   CHOL 193 07/22/2019   Lab Results  Component Value Date   HDL 54.80 07/22/2019   Lab Results  Component Value Date   LDLCALC 122 (H) 07/22/2019   Lab Results  Component Value Date   TRIG 84.0 07/22/2019   Lab Results  Component Value Date   CHOLHDL 4 07/22/2019   Lab Results  Component Value Date   HGBA1C 5.7 07/22/2019      Assessment & Plan:   Problem List Items Addressed This Visit      Musculoskeletal and Integument   Abrasion of nose   Relevant Medications   mupirocin ointment (BACTROBAN) 2 %     Other   Tremor of face and hands - Primary   Relevant Orders   Ambulatory referral to Neurology   Memory change   Relevant Orders   Ambulatory referral to Neurology   CBC   Comprehensive metabolic panel      Meds ordered this encounter  Medications  . mupirocin ointment (BACTROBAN) 2 %    Sig: Apply 1 application topically 2 (two) times daily. To right nare.    Dispense:  22 g    Refill:  0    Follow-up: Return in about 3 months (around 05/04/2020).    Libby Maw, MD

## 2020-02-03 ENCOUNTER — Ambulatory Visit: Payer: Medicare Other | Admitting: Rehabilitation

## 2020-02-03 ENCOUNTER — Encounter: Payer: Self-pay | Admitting: Rehabilitation

## 2020-02-03 ENCOUNTER — Telehealth: Payer: Self-pay | Admitting: Family Medicine

## 2020-02-03 DIAGNOSIS — N6489 Other specified disorders of breast: Secondary | ICD-10-CM

## 2020-02-03 DIAGNOSIS — Z483 Aftercare following surgery for neoplasm: Secondary | ICD-10-CM | POA: Diagnosis not present

## 2020-02-03 DIAGNOSIS — R600 Localized edema: Secondary | ICD-10-CM

## 2020-02-03 LAB — COMPREHENSIVE METABOLIC PANEL
ALT: 12 U/L (ref 0–35)
AST: 19 U/L (ref 0–37)
Albumin: 3.8 g/dL (ref 3.5–5.2)
Alkaline Phosphatase: 49 U/L (ref 39–117)
BUN: 19 mg/dL (ref 6–23)
CO2: 32 mEq/L (ref 19–32)
Calcium: 9.2 mg/dL (ref 8.4–10.5)
Chloride: 103 mEq/L (ref 96–112)
Creatinine, Ser: 0.67 mg/dL (ref 0.40–1.20)
GFR: 86.18 mL/min (ref >60.00)
Glucose, Bld: 85 mg/dL (ref 70–99)
Potassium: 4.8 mEq/L (ref 3.5–5.1)
Sodium: 141 mEq/L (ref 135–145)
Total Bilirubin: 0.3 mg/dL (ref 0.2–1.2)
Total Protein: 6.5 g/dL (ref 6.0–8.3)

## 2020-02-03 LAB — CBC
HCT: 39.8 % (ref 36.0–46.0)
Hemoglobin: 13 g/dL (ref 12.0–15.0)
MCHC: 32.6 g/dL (ref 30.0–36.0)
MCV: 92.2 fl (ref 78.0–100.0)
Platelets: 281 10*3/uL (ref 150.0–400.0)
RBC: 4.31 Mil/uL (ref 3.87–5.11)
RDW: 14.6 % (ref 11.5–15.5)
WBC: 3.6 10*3/uL — ABNORMAL LOW (ref 4.0–10.5)

## 2020-02-03 NOTE — Progress Notes (Signed)
Assessment/Plan:   1.  Cervical Dystonia  -I talked to the patient about the nature and pathophysiology.  The patient is having trouble with ADL's and with rotation of the head in daily life for driving.  The primary muscles involved are the R SCM and L splenius capitus.  We talked about treatments.  We talked about the value of botox.  The patient was educated on the botulinum toxin the black blox warning and given a copy of the botox patient medication guide.  The patient understands that this warning states that there have been reported cases of the Botox extending beyond the injection site and creating adverse effects, similar to those of botulism. This included loss of strength, trouble walking, hoarseness, trouble saying words clearly, loss of bladder control, trouble breathing, trouble swallowing, diplopia, blurry vision and ptosis. Most of the distant spread of Botox was happening in patients, primarily children, who received medication for spasticity or for cervical dystonia. The patient doesn't wish to proceed.  Not that bothersome right now.  2.  Memory change  -suspect MCI  -do neurocog testing  -do B12 testing  3.  F/u depending on results of the above   Subjective:   Brenda Delgado was seen in consultation in the movement disorder clinic at the request of Libby Maw,*.  The evaluation is for tremor. This patient is accompanied in the office by her spouse who supplements the history. Records indicate that patient had longstanding tremor involving head and hands.  Tremor started about 15 years ago.  It started in the head.  It is in the hands as well.  Sometimes it is irritating and sometimes it is not noticeable.  Its more bothersome in the hands than head.  There is no family hx of tremor.    States that a few years ago she had trouble turning head to the right and Dr. Rita Ohara went in and did surgery.  "it helped but didn't make it completely perfect."  Affected by  caffeine:  Yes.   (drinks about 2 cups coffee in AM) Affected by alcohol: unknown (drinks 1 time per month) Affected by stress:  Yes.   Affected by fatigue:  No. Spills soup if on spoon:  No. Spills glass of liquid if full:  No. Affects ADL's (tying shoes, brushing teeth, etc):  No.  Current/Previously tried tremor medications: none  Current medications that may exacerbate tremor:  Albuterol (uses it for seasonal allergies) - doesn't know if changes tremor  Outside reports reviewed: historical medical records, lab reports, office notes and referral letter/letters.  Neuroimaging of the brain was last completed in October, 2017.  It was unremarkable.  This is a CT brain.  I personally reviewed it.  Also c/o some memory change.  Patient has been under increased stress.  She was diagnosed with breast cancer this year.  She was treated with lumpectomy, adjuvant radiation and tamoxifen starting in March, 2021.  She is doing better with this now.    Pt notes biggest issues with memory is word finding issues/naming issues.  Pts husband will sometimes note slow retrieval.   Living situation:  Pt lives with their spouse.  The patient does not do the finances in the home.  Husband has always done the finances.  The patient does drive.   There have not been any motor vehicle accidents in the recent years.  She has been able to get to common locations without trouble.  The patient does cook.  There is  able difficulty remembering common recipes.  The stovetop has been left on accidentally.  Her husband has had to remind her about that.    ADL's:  The patient is able to perform her own ADL's. The patient self medicates.  Takes them herself and takes them out of the pill bottles the pharmacy gives.  Has some bladder urgency but doesn't have to wear anything underneath.    Behavior:   There have been no behavioral changes over the years.     Allergies  Allergen Reactions  . Pravastatin     Other  reaction(s): Myalgias (intolerance) 2016  . Erythromycin Nausea And Vomiting    Cramps   . Iohexol Hives     Code: HIVES, Desc: Pt's husband called when they got home from pt's CEPI #1.  Pt w/ hives/itching.  Instructed on taking Benadryl now, 1hr and q6hrs prn.  Instructed to add IV Contrast to allergy list, esp for CT scans, and to get pre-med info in the future.  jkl, Onset Date: [id number]   . Amoxicillin-Pot Clavulanate Other (See Comments)    GI Upset    . Codeine Nausea And Vomiting  . Moxifloxacin Itching  . Sulfamethoxazole Itching and Rash    Current Outpatient Medications  Medication Instructions  . ALBUTEROL IN Inhalation, As needed  . aspirin EC 81 mg, Oral, Daily  . Calcium-Vitamin D-Vitamin K [id number] MG-UNT-MCG CHEW 2 tablets, Daily  . Coenzyme Q10 (COQ10 PO) 1 capsule, Oral, Daily  . EPINEPHrine 0.3 mg/0.3 mL IJ SOAJ injection SMARTSIG:1 Pre-Filled Pen Syringe IM As Needed  . mupirocin ointment (BACTROBAN) 2 % 1 application, Topical, 2 times daily, To right nare.  . pantoprazole (PROTONIX) 40 mg, Oral, Daily at bedtime  . PARoxetine (PAXIL) 20 mg, Oral, Daily  . tamoxifen (NOLVADEX) 10 mg, Oral, Daily  . Vitamin D3 50 mcg, Oral, 2 times daily     Objective:   VITALS:   Vitals:   02/04/20 0846  BP: 125/77  Pulse: 73  SpO2: 96%  Weight: 115 lb (52.2 kg)  Height: 4\' 11"  (1.499 m)   Gen:  Appears stated age and in NAD. HEENT:  Normocephalic, atraumatic. The mucous membranes are moist. The superficial temporal arteries are without ropiness or tenderness. Cardiovascular: Regular rate and rhythm. Lungs: Clear to auscultation bilaterally. Neck: There are no carotid bruits noted bilaterally.  Head is turned to the left with irregular head tremor.    NEUROLOGICAL:  Orientation:  The patient is alert and oriented x 3.   Cranial nerves: There is good facial symmetry. Extraocular muscles are intact and visual fields are full to confrontational testing. Speech  is fluent and clear. Soft palate rises symmetrically and there is no tongue deviation. Hearing is intact to conversational tone. Tone: Tone is good throughout. Sensation: Sensation is intact to light touch touch throughout (facial, trunk, extremities). Vibration is intact at the bilateral big toe but slightly decreased. There is no extinction with double simultaneous stimulation. There is no sensory dermatomal level identified. Coordination:  The patient has no dysdiadichokinesia or dysmetria. Motor: Strength is 5/5 in the bilateral upper and lower extremities.  Shoulder shrug is equal bilaterally.  There is no pronator drift.  There are no fasciculations noted. DTR's: Deep tendon reflexes are 2/4 at the bilateral biceps, triceps, brachioradialis, 3/4 at the bilateral patella.  Plantar responses are downgoing bilaterally. Gait and Station: The patient is able to ambulate without difficulty. The patient is able to heel toe walk without any difficulty. The patient  is able to ambulate in a tandem fashion. The patient is able to stand in the Romberg position.   MOVEMENT EXAM: Tremor:  There is minimal  tremor in the UE, noted most significantly with action. No tremor when given a weight    I have reviewed and interpreted the following labs independently   Chemistry      Component Value Date/Time   NA 141 02/02/2020 1521   K 4.8 02/02/2020 1521   CL 103 02/02/2020 1521   CO2 32 02/02/2020 1521   BUN 19 02/02/2020 1521   CREATININE 0.67 02/02/2020 1521   CREATININE 0.67 07/30/2019 0844      Component Value Date/Time   CALCIUM 9.2 02/02/2020 1521   ALKPHOS 49 02/02/2020 1521   AST 19 02/02/2020 1521   AST 18 07/30/2019 0844   ALT 12 02/02/2020 1521   ALT 12 07/30/2019 0844   BILITOT 0.3 02/02/2020 1521   BILITOT 0.4 07/30/2019 0844      Lab Results  Component Value Date   WBC 3.6 (L) 02/02/2020   HGB 13.0 02/02/2020   HCT 39.8 02/02/2020   MCV 92.2 02/02/2020   PLT 281.0 02/02/2020     Lab Results  Component Value Date   TSH 1.30 07/22/2019   No results found for: VITAMINB12  No results found for: FOLATE    Total time spent on today's visit was 60 minutes, including both face-to-face time and nonface-to-face time.  Time included that spent on review of records (prior notes available to me/labs/imaging if pertinent), discussing treatment and goals, answering patient's questions and coordinating care.  CC:  Libby Maw, MD

## 2020-02-03 NOTE — Therapy (Signed)
[place], [place], [address] Phone: [phone]   Fax:  [phone]  Physical Therapy Treatment  Patient Details  Name: Brenda Delgado MRN: [id number] Date of Birth: 1946-03-06 Referring Provider (PT): Wilber Bihari   Encounter Date: 02/03/2020   PT End of Session - 02/03/20 1154    Visit Number 4    Number of Visits 7    Date for PT Re-Evaluation 02/06/20    PT Start Time 1103    PT Stop Time 1148    PT Time Calculation (min) 45 min    Activity Tolerance Patient tolerated treatment well    Behavior During Therapy [facility] for tasks assessed/performed           Past Medical History:  Diagnosis Date  . Allergy   . Anxiety   . Arthritis    right knee  . Cancer ([place]) 07/2019   left breast DCIS  . Complication of anesthesia    per pt with surgery 05-14-2019 right facial swelling and lip/ mouth blister's  . Constipation   . Depression   . Environmental and seasonal allergies   . Family history of colon cancer   . Family history of ovarian cancer   . GERD (gastroesophageal reflux disease)   . Hiatal hernia   . History of kidney stones   . Left ureteral stone   . Mild asthma   . Nephrolithiasis   . Nocturia   . PONV (postoperative nausea and vomiting)   . Pre-diabetes   . Renal calculus, left   . Wears glasses   . Wears hearing aid in both ears     Past Surgical History:  Procedure Laterality Date  . ABDOMINAL HYSTERECTOMY    . ANTERIOR CERVICAL DECOMP/DISCECTOMY FUSION  01-06-2010   @[facility]    C4--5  . BREAST LUMPECTOMY WITH RADIOACTIVE SEED LOCALIZATION Left 08/12/2019   Procedure: LEFT BREAST LUMPECTOMY WITH RADIOACTIVE SEED LOCALIZATION;  Surgeon: Alphonsa Overall, MD;  Location: [place];  Service: General;  Laterality: Left;  . CARPAL TUNNEL RELEASE Right 2000  . cataracts  2018   bilateral  . CYSTOCELE REPAIR  09/11/2011   Procedure: ANTERIOR REPAIR (CYSTOCELE);   Surgeon: Olga Millers, MD;  Location: [facility];  Service: Gynecology;  Laterality: N/A;  . CYSTOSCOPY  09/11/2011   Procedure: CYSTOSCOPY;  Surgeon: Olga Millers, MD;  Location: [facility];  Service: Gynecology;  Laterality: N/A;  . CYSTOSCOPY WITH RETROGRADE PYELOGRAM, URETEROSCOPY AND STENT PLACEMENT Left 05/28/2019   Procedure: CYSTOSCOPY WITH RETROGRADE PYELOGRAM, URETEROSCOPY AND STENT REPLACEMENT;  Surgeon: Alexis Frock, MD;  Location: [facility];  Service: Urology;  Laterality: Left;  . CYSTOSCOPY WITH STENT PLACEMENT Left 05/14/2019   Procedure: CYSTOSCOPY WITH STENT PLACEMENT, RETROGRADE;  Surgeon: Bjorn Loser, MD;  Location: [facility];  Service: Urology;  Laterality: Left;  . ELBOW SURGERY Right   . EXCISION VULVA CYST Bilateral 01-16-2005  @[facility]   . HOLMIUM LASER APPLICATION Left 11/57/2620   Procedure: HOLMIUM LASER APPLICATION;  Surgeon: Alexis Frock, MD;  Location: [facility];  Service: Urology;  Laterality: Left;  . KNEE ARTHROSCOPY Right 2005;    02-11-2013  . KNEE SURGERY Right 08-20-2012   @[facility]    peroneal nerve neurolysis proximal tibia/ fibular articular branch   . LAPAROSCOPIC CHOLECYSTECTOMY  1990s  . SACROSPINOUS LIGAMENT FIXATION  04-05-2017   @[facility]    UPHOLD vaginal mesh and  Anterior Repair  . SALPINGOOPHORECTOMY  09/11/2011   Procedure: SALPINGO OOPHERECTOMY;  Surgeon: Olga Millers, MD;  Location: [facility];  Service: Gynecology;  Laterality: Bilateral;  . SHOULDER SURGERY Right   . TONSILLECTOMY    . VAGINAL HYSTERECTOMY  09/11/2011   Procedure: HYSTERECTOMY VAGINAL;  Surgeon: Olga Millers, MD;  Location: [facility];  Service: Gynecology;  Laterality: N/A;  . [place] Dana Milagros Loll BIOPSY  09/11/2011   Procedure: VULVAR BIOPSY;  Surgeon: Olga Millers, MD;  Location: [facility];  Service: Gynecology;  Laterality: Left;  sebaceous cyst excision left vula    There were no vitals filed for this visit.   Subjective Assessment - 02/03/20 1101     Subjective I am doing better but some of the band exercises hurt the shoulder when they go overhead.  The breast has been doing better.    Pertinent History Left lumpectomy on 08/12/19 by Dr. [name] with no lymph nodes removed.  Radiation completed 10/07/19 and now on Tamoxifen now.  History of hysterectomy, bladder prolapse with need for fixation, Recent surgery for kidney stones    Currently in Pain? No/denies                       Outpatient [facility]12/[facility]  Lymphedema Life Impact Scale Total Score 13.24 %            [facility] Adult PT Treatment/Exercise - 02/03/20 0001      Manual Therapy   Manual Therapy Manual Lymphatic Drainage (MLD)    Edema Management resent message to Mendel Ryder about possible aspiration per pt request as she would prefer this over many therapy visits if possible.      Manual Lymphatic Drainage (MLD) In supine: short neck, shoulder collectors, upper trap, bil axillary and L inguinal nodes, anterior interaxillary and L axillo-inguinal anastomosis, superior/inferior breast toward corressponding anastomosis then re-worked anasotmosis, in R side-lying re-worked L axillo-inguinal anastomosis, extra time spent at inferior breast then posterior inter-axillary anastomosis, re-worked L axillo-inguinal anastomosis then anterior inter-axillary anastomosis in supine; deep abdominals.     Passive ROM avoided today due to impingement type pain                       PT Long Term Goals - 01/12/20 2114      PT LONG TERM GOAL #1   Title Pt will be independent with self care for left breast edema including self MLD and compression    Time 4    Period Weeks    Status New      PT LONG TERM GOAL #2   Title Pt will report decrease in seroma size of at least 50% to decrease breast heaviness    Time 4    Period Weeks    Status New      PT LONG TERM GOAL #3   Title Pt will be ind with final HEP for  continued strength and mobility of the Lt UE    Time 4    Period Weeks    Status New                 Plan - 02/03/20 1155    Clinical Impression Statement Seroma slightly deflated compared to evaluation visit with this PT but still evident.  Continued MLD to the left breast.  Pt noting some impingement type pain getting the arm overhead with the band and now with some ADLs so she was instructed to keep the arm below shoulder height for her activities.  Pt noted no help with gray foam and reports if aspiration is possible she would be interested due to schedule.  Note sent to [facility] about referral to Dr. Marlou Starks, etc.    PT Frequency 2x / week    PT Duration 4 weeks    PT Treatment/Interventions ADLs/Self Care Home Management;Therapeutic exercise;Manual lymph drainage;Scar mobilization;Passive range of motion;Manual techniques;Taping    PT Next Visit Plan cont MLD to the left breast; Any follow up from [place] Re aspiration? can use Lt intact axillary nodes for MLD    PT Home Exercise Plan self MLD; Lt shoulder ROM stretching           Patient will benefit from skilled therapeutic intervention in order to improve the following deficits and impairments:     Visit Diagnosis: Seroma of breast  Localized edema  Aftercare following surgery for neoplasm     Problem List Patient Active Problem List   Diagnosis Date Noted  . Abrasion of nose 02/02/2020  . Memory change 02/02/2020  . Genetic testing 08/13/2019  . Family history of ovarian cancer   . Family history of colon cancer   . Ductal carcinoma in situ (DCIS) of left breast 07/28/2019  . History of elevated glucose 07/22/2019  . Elevated LDL cholesterol level 07/22/2019  . Tremor of face and hands 07/22/2019  . Left ureteral stone 05/14/2019  . METATARSALGIA 04/15/2008  . FOOT PAIN, BILATERAL 04/15/2008    Stark Bray 02/03/2020, 11:57 AM  [place], [place], [address] Phone: [phone]   Fax:  [phone]  Name: KARIANNA GUSMAN MRN: [id number] Date of Birth: 1945/11/28

## 2020-02-03 NOTE — Telephone Encounter (Signed)
Patient is calling to see if lab results were back. CB is [phone]

## 2020-02-04 ENCOUNTER — Ambulatory Visit (INDEPENDENT_AMBULATORY_CARE_PROVIDER_SITE_OTHER): Payer: BC Managed Care – PPO | Admitting: Neurology

## 2020-02-04 ENCOUNTER — Encounter: Payer: Self-pay | Admitting: Neurology

## 2020-02-04 ENCOUNTER — Other Ambulatory Visit (INDEPENDENT_AMBULATORY_CARE_PROVIDER_SITE_OTHER): Payer: BC Managed Care – PPO

## 2020-02-04 ENCOUNTER — Other Ambulatory Visit: Payer: Self-pay

## 2020-02-04 VITALS — BP 125/77 | HR 73 | Ht 59.0 in | Wt 115.0 lb

## 2020-02-04 DIAGNOSIS — G243 Spasmodic torticollis: Secondary | ICD-10-CM

## 2020-02-04 DIAGNOSIS — R413 Other amnesia: Secondary | ICD-10-CM

## 2020-02-04 LAB — VITAMIN B12: Vitamin B-12: 250 pg/mL (ref 211–911)

## 2020-02-04 NOTE — Telephone Encounter (Signed)
Called patient

## 2020-02-04 NOTE — Patient Instructions (Addendum)
1.  You have been referred for a neurocognitive evaluation in our office.   The evaluation has two parts.   . The first part of the evaluation is a clinical interview with the neuropsychologist (Dr. Melvyn Novas or Dr. Nicole Kindred). Please bring someone with you to this appointment if possible, as it is helpful for the doctor to hear from both you and another adult who knows you well.   . The second part of the evaluation is testing with the doctor's technician Hinton Dyer or Maudie Mercury). The testing includes a variety of tasks- mostly question-and-answer, some paper-and-pencil. There is nothing you need to do to prepare for this appointment, but having a good night's sleep prior to the testing, taking medications as you normally would, and bringing eyeglasses and hearing aids (if you wear them), is advised. Please make sure that you wear a mask to the appointment.  Please note: We have to reserve several hours of the neuropsychologist's time and the psychometrician's time for your evaluation appointment. As such, please note that there is a No-Show fee of $100. If you are unable to attend any of your appointments, please contact our office as soon as possible to reschedule.  2.  Your provider has requested that you have labwork completed today. Please go to [facility] ([id number]) on the second floor of this building before leaving the office today. You do not need to check in. If you are not called within 15 minutes please check with the front desk.

## 2020-02-05 ENCOUNTER — Ambulatory Visit: Payer: Medicare Other | Attending: Adult Health

## 2020-02-05 DIAGNOSIS — N6489 Other specified disorders of breast: Secondary | ICD-10-CM | POA: Diagnosis not present

## 2020-02-05 DIAGNOSIS — R600 Localized edema: Secondary | ICD-10-CM | POA: Diagnosis not present

## 2020-02-05 DIAGNOSIS — Z483 Aftercare following surgery for neoplasm: Secondary | ICD-10-CM | POA: Diagnosis not present

## 2020-02-05 NOTE — Therapy (Addendum)
[place], [place], [address] Phone: [phone]   Fax:  [phone]  Physical Therapy Treatment  Patient Details  Name: Brenda Delgado MRN: [id number] Date of Birth: 1946/03/13 Referring Provider (PT): Wilber Bihari   Encounter Date: 02/05/2020   PT End of Session - 02/05/20 0904    Visit Number 5    Number of Visits 7    Date for PT Re-Evaluation 02/06/20    PT Start Time 0903    PT Stop Time 0945    PT Time Calculation (min) 42 min    Activity Tolerance Patient tolerated treatment well    Behavior During Therapy [facility] for tasks assessed/performed           Past Medical History:  Diagnosis Date  . Allergy   . Anxiety   . Arthritis    right knee  . Cancer ([place]) 07/2019   left breast DCIS  . Complication of anesthesia    per pt with surgery 05-14-2019 right facial swelling and lip/ mouth blister's  . Constipation   . Depression   . Environmental and seasonal allergies   . Family history of colon cancer   . Family history of ovarian cancer   . GERD (gastroesophageal reflux disease)   . Hiatal hernia   . History of kidney stones   . Left ureteral stone   . Mild asthma   . Nephrolithiasis   . Nocturia   . PONV (postoperative nausea and vomiting)   . Pre-diabetes   . Renal calculus, left   . Wears glasses   . Wears hearing aid in both ears     Past Surgical History:  Procedure Laterality Date  . ABDOMINAL HYSTERECTOMY    . ANTERIOR CERVICAL DECOMP/DISCECTOMY FUSION  01-06-2010   '@[facility]'    C4--5  . BREAST LUMPECTOMY WITH RADIOACTIVE SEED LOCALIZATION Left 08/12/2019   Procedure: LEFT BREAST LUMPECTOMY WITH RADIOACTIVE SEED LOCALIZATION;  Surgeon: Alphonsa Overall, MD;  Location: [facility];  Service: General;  Laterality: Left;  . CARPAL TUNNEL RELEASE Right 2000  . cataracts  2018   bilateral  . CYSTOCELE REPAIR  09/11/2011   Procedure: ANTERIOR REPAIR (CYSTOCELE);  Surgeon:  Olga Millers, MD;  Location: [facility];  Service: Gynecology;  Laterality: N/A;  . CYSTOSCOPY  09/11/2011   Procedure: CYSTOSCOPY;  Surgeon: Olga Millers, MD;  Location: [facility];  Service: Gynecology;  Laterality: N/A;  . CYSTOSCOPY WITH RETROGRADE PYELOGRAM, URETEROSCOPY AND STENT PLACEMENT Left 05/28/2019   Procedure: CYSTOSCOPY WITH RETROGRADE PYELOGRAM, URETEROSCOPY AND STENT REPLACEMENT;  Surgeon: Alexis Frock, MD;  Location: [facility];  Service: Urology;  Laterality: Left;  . CYSTOSCOPY WITH STENT PLACEMENT Left 05/14/2019   Procedure: CYSTOSCOPY WITH STENT PLACEMENT, RETROGRADE;  Surgeon: Bjorn Loser, MD;  Location: [facility];  Service: Urology;  Laterality: Left;  . ELBOW SURGERY Right   . EXCISION VULVA CYST Bilateral 01-16-2005  '@[facility]'   . HOLMIUM LASER APPLICATION Left 53/64/6803   Procedure: HOLMIUM LASER APPLICATION;  Surgeon: Alexis Frock, MD;  Location: [facility];  Service: Urology;  Laterality: Left;  . KNEE ARTHROSCOPY Right 2005;    02-11-2013  . KNEE SURGERY Right 08-20-2012   '@[facility]'    peroneal nerve neurolysis proximal tibia/ fibular articular branch   . LAPAROSCOPIC CHOLECYSTECTOMY  1990s  . SACROSPINOUS LIGAMENT FIXATION  04-05-2017   '@[facility]'    UPHOLD vaginal mesh and  Anterior Repair  . SALPINGOOPHORECTOMY  09/11/2011   Procedure: SALPINGO OOPHERECTOMY;  Surgeon: Olga Millers, MD;  Location: [facility];  Service: Gynecology;  Laterality: Bilateral;  . SHOULDER SURGERY Right   . TONSILLECTOMY    . VAGINAL HYSTERECTOMY  09/11/2011   Procedure: HYSTERECTOMY VAGINAL;  Surgeon: Olga Millers, MD;  Location: [facility];  Service: Gynecology;  Laterality: N/A;  . [place] Dana Milagros Loll BIOPSY  09/11/2011   Procedure: VULVAR BIOPSY;  Surgeon: Olga Millers, MD;  Location: [facility];  Service: Gynecology;  Laterality: Left;  sebaceous cyst excision left vula    There were no vitals filed for this visit.   Subjective Assessment - 02/05/20 0904     Subjective Pt states that she is doing 5 instead of 10 repetitions and going a little over head with her band exercises avoiding pain.    Pertinent History Left lumpectomy on 08/12/19 by Dr. [name] with no lymph nodes removed.  Radiation completed 10/07/19 and now on Tamoxifen now.  History of hysterectomy, bladder prolapse with need for fixation, Recent surgery for kidney stones    Patient Stated Goals get breast swelling under control    Currently in Pain? No/denies    Pain Score 0-No pain                       [facility]patient [facility]12/[facility]  Lymphedema Life Impact Scale Total Score 13.24 %            [facility] Adult PT Treatment/Exercise - 02/05/20 0001      Manual Therapy   Manual Therapy Manual Lymphatic Drainage (MLD);Joint mobilization    Joint Mobilization P-A joints mots at distal clavicle and proximal humerals 8x8 2x each intermittently with P/ROM    Manual Lymphatic Drainage (MLD) In supine: short neck, shoulder collectors, upper trap, bil axillary and L inguinal nodes, anterior interaxillary and L axillo-inguinal anastomosis, superior/inferior breast toward corressponding anastomosis then re-worked anasotmosis, in R side-lying re-worked L axillo-inguinal anastomosis, extra time spent at inferior breast then posterior inter-axillary anastomosis, re-worked L axillo-inguinal anastomosis then anterior inter-axillary anastomosis in supine; deep abdominals.     Passive ROM Pain around 120 degrees flexion and 110 abduction; joint mobs at [facility] joint continued with slight pain around 120 for both, external rotation with initial pain, joint mobs to humerus, external rotation with less pain that decreased with repetitions. Pt was then able to reach ~150 degrees flexion/abduction pain-free. 10x each direction                  PT Education - 02/05/20 0953    Education Details Pt will continue with her home exercises. She will try  to perform AA/ROM activities to help decrease stiffness at the [facility] joint prior to exercise. She will not perform exercises into pain.    Person(s) Educated Patient    Methods Explanation    Comprehension Verbalized understanding               PT Long Term Goals - 01/12/20 2114      PT LONG TERM GOAL #1   Title Pt will be independent with self care for left breast edema including self MLD and compression    Time 4    Period Weeks    Status New      PT LONG TERM GOAL #2   Title Pt will report decrease in seroma size of at least 50% to decrease breast heaviness    Time 4    Period Weeks    Status New  PT LONG TERM GOAL #3   Title Pt will be ind with final HEP for continued strength and mobility of the Lt UE    Time 4    Period Weeks    Status New                 Plan - 02/05/20 0904    Clinical Impression Statement Pt continues with edema in her L breast though it is very soft and responding well to MLD and compression. MLD was performed for the L breast utilizing posterior inter-axillary anastomosis as well as anterior and L axillo-inguinal in order to move fluid from the L breast. Pt continued with impingment at the L shoulder discussed the importance of joint movement to decrease stiffness/pain in the L AC joint. Pt pain decreased considerably with significant improvement in P/ROM into flexion/abduction with joint mobilizations and external rotation P/ROM. Pt will benefit from continued POC at this time.    Personal Factors and Comorbidities Age;Comorbidity 1    Comorbidities radiation    Stability/Clinical Decision Making Stable/Uncomplicated    Rehab Potential Excellent    PT Frequency 2x / week    PT Duration 4 weeks    PT Treatment/Interventions ADLs/Self Care Home Management;Therapeutic exercise;Manual lymph drainage;Scar mobilization;Passive range of motion;Manual techniques;Taping    PT Next Visit Plan cont MLD to the left breast; Any follow up from [place]  Re aspiration? can use Lt intact axillary nodes for MLD    PT Home Exercise Plan self MLD; Lt shoulder ROM stretching    Consulted and Agree with Plan of Care Patient           Patient will benefit from skilled therapeutic intervention in order to improve the following deficits and impairments:  Decreased range of motion, Increased edema  Visit Diagnosis: Seroma of breast  Localized edema  Aftercare following surgery for neoplasm     Problem List Patient Active Problem List   Diagnosis Date Noted  . Abrasion of nose 02/02/2020  . Memory change 02/02/2020  . Genetic testing 08/13/2019  . Family history of ovarian cancer   . Family history of colon cancer   . Ductal carcinoma in situ (DCIS) of left breast 07/28/2019  . History of elevated glucose 07/22/2019  . Elevated LDL cholesterol level 07/22/2019  . Tremor of face and hands 07/22/2019  . Left ureteral stone 05/14/2019  . METATARSALGIA 04/15/2008  . FOOT PAIN, BILATERAL 04/15/2008   PHYSICAL THERAPY DISCHARGE SUMMARY  Plan: Patient agrees to discharge.  Patient goals were not met. Patient is being discharged due to the patient's request.  ?????       Ander Purpura, PT 02/05/2020, 10:01 AM  [place], [place], [address] Phone: [phone]   Fax:  [phone]  Name: Brenda Delgado MRN: [id number] Date of Birth: Jun 03, 1946

## 2020-02-06 ENCOUNTER — Telehealth: Payer: Self-pay | Admitting: Family Medicine

## 2020-02-06 NOTE — Telephone Encounter (Signed)
Patient is calling with questions/concerns regarding the Mutirocin Ointment that she was prescribed. Please call her back at [phone] and advise.

## 2020-02-06 NOTE — Telephone Encounter (Signed)
Returned patients call, no answer LMTCB 

## 2020-02-11 DIAGNOSIS — R251 Tremor, unspecified: Secondary | ICD-10-CM | POA: Diagnosis not present

## 2020-02-11 DIAGNOSIS — D0592 Unspecified type of carcinoma in situ of left breast: Secondary | ICD-10-CM | POA: Diagnosis not present

## 2020-02-12 NOTE — Telephone Encounter (Signed)
Returned patients call no answer LMTCB 

## 2020-02-13 ENCOUNTER — Encounter: Payer: BC Managed Care – PPO | Admitting: Rehabilitation

## 2020-02-16 NOTE — Telephone Encounter (Signed)
Patient called back to let the nurse that know she has done fine with the ointment and the abrasion inside her nose has gone away.

## 2020-02-23 DIAGNOSIS — L7634 Postprocedural seroma of skin and subcutaneous tissue following other procedure: Secondary | ICD-10-CM | POA: Diagnosis not present

## 2020-02-23 DIAGNOSIS — D0592 Unspecified type of carcinoma in situ of left breast: Secondary | ICD-10-CM | POA: Diagnosis not present

## 2020-02-26 DIAGNOSIS — D0592 Unspecified type of carcinoma in situ of left breast: Secondary | ICD-10-CM | POA: Diagnosis not present

## 2020-02-26 DIAGNOSIS — N6489 Other specified disorders of breast: Secondary | ICD-10-CM | POA: Diagnosis not present

## 2020-03-05 ENCOUNTER — Encounter: Payer: BC Managed Care – PPO | Admitting: Counselor

## 2020-03-05 DIAGNOSIS — L7634 Postprocedural seroma of skin and subcutaneous tissue following other procedure: Secondary | ICD-10-CM | POA: Diagnosis not present

## 2020-03-08 ENCOUNTER — Telehealth: Payer: Self-pay | Admitting: Family Medicine

## 2020-03-08 DIAGNOSIS — F418 Other specified anxiety disorders: Secondary | ICD-10-CM

## 2020-03-08 MED ORDER — PAROXETINE HCL 20 MG PO TABS: 20.0000 mg | ORAL_TABLET | Freq: Every day | ORAL | 0 refills | Status: DC

## 2020-03-08 NOTE — Telephone Encounter (Signed)
Pt called in and said that her pharmacy said they had sent in a refill request for PARoxetine (PAXIL) 20 MG tablet a few times and havent heard anything, Pt said she is now out. Please advise

## 2020-03-08 NOTE — Telephone Encounter (Signed)
Rx sent in called patient to inform, no answer unable to leave message.

## 2020-03-17 ENCOUNTER — Encounter: Payer: BC Managed Care – PPO | Admitting: Counselor

## 2020-04-02 ENCOUNTER — Encounter: Payer: Self-pay | Admitting: Counselor

## 2020-04-02 ENCOUNTER — Ambulatory Visit: Payer: BC Managed Care – PPO

## 2020-04-02 ENCOUNTER — Other Ambulatory Visit: Payer: Self-pay

## 2020-04-02 ENCOUNTER — Ambulatory Visit (INDEPENDENT_AMBULATORY_CARE_PROVIDER_SITE_OTHER): Payer: BC Managed Care – PPO | Admitting: Counselor

## 2020-04-02 DIAGNOSIS — G3184 Mild cognitive impairment, so stated: Secondary | ICD-10-CM | POA: Diagnosis not present

## 2020-04-02 DIAGNOSIS — F09 Unspecified mental disorder due to known physiological condition: Secondary | ICD-10-CM

## 2020-04-02 DIAGNOSIS — G243 Spasmodic torticollis: Secondary | ICD-10-CM

## 2020-04-02 NOTE — Progress Notes (Signed)
[facility]  Patient Name: Brenda Delgado MRN: [id number] Date of Birth: [birth year] Age: 74 y.o. Education: 16 years  Measurement properties of test scores: IQ, Index, and Standard Scores (SS): Mean = 100; Standard Deviation = 15 Scaled Scores (Ss): Mean = 10; Standard Deviation = 3 Z scores (Z): Mean = 0; Standard Deviation = 1 T scores (T); Mean = 50; Standard Deviation = 10  TEST SCORES:    Note: This summary of test scores accompanies the interpretive report and should not be interpreted by unqualified individuals or in isolation without reference to the report. Test scores are relative to age, gender, and educational history as available and appropriate.   Performance Validity        "A" Random Letter Test Raw  Descriptor      Errors 0 Within Expectation  The Dot Counting Test: 8 Within Expectation      Mental Status Screening     Total Score Descriptor  MoCA 23 MCI      Expected Functioning        Wide Range Achievement Test: Standard/Scaled Score Percentile      Word Reading 102 55      Attention/Processing Speed        Neuropsychological Assessment Battery (Attention Module, Form 1): T-score Percentile      Digits Forward 41 18      Digits Backwards 45 31      Repeatable Battery for the Assessment of Neuropsychological Status (Form A): Scaled Score Percentile      Coding 9 37      Language        Neuropsychological Assessment Battery (Language Module, Form 1): T-score Percentile      Naming   (30) 57 75      Verbal Fluency: T-score Percentile      Controlled Oral Word Association (F-A-S) 47 38      Semantic Fluency (Animals) 41 18      Memory:        Neuropsychological Assessment Battery (Memory Module, Form 1): T-score Percentile      List Learning           List A Immediate Recall   (2, 6, 8) 32 4         List B Immediate Recall   (4) 45 31         List A Short Delayed Recall   (6) 40 16         List A Long  Delayed Recall   (4) 32 4         List A Percent Retention   (67 %) --- 21         List A Long Delayed Yes/No Recognition Hits   (10) --- 21         List A Long Delayed Yes/No Recognition False Alarms   (1) --- 82         List A Recognition Discriminability Index --- 73     Story Learning           Immediate Recall   (20, 32) 36 8         Delayed Recall   (33) 47 38         Percent Retention   (103 %) --- 66      Daily Living Memory            Immediate Recall   (23, 14) 37 9  Delayed Recall   (7, 6) 42 21          Percent Retention (81 %) --- 42          Recognition Hits    (8) --- 27      Repeatable Battery for the Assessment of Neuropsychological Status (Form A): Scaled Score Percentile         Figure Recall   (15) 12 75      Visuospatial/Constructional Functioning        Repeatable Battery for the Assessment of Neuropsychological Status (Form A): Standard/Scaled Score Percentile     Visuospatial/Constructional Index 102 55         Figure Copy   (17) 9 37         Judgment of Line Orientation   (18) --- 51-75      Executive Functioning        [employer] Test (MWCST): Standard/T-Score Percentile      Number of Categories Correct 55 69      Number of Perseverative Errors 38 12      Number of Total Errors 41 18      Percent Perseverative Errors 38 12  Executive Function Composite 94 34      Trail Making Test: T-Score Percentile      Part A 56 73      Part B 41 18      Boston Diagnostic Aphasia Exam: Raw Score Scaled Score      Complex Ideational Material 12 12      Clock Drawing Raw Score Descriptor      Command 10 WNL      Rating Scales        Clinical Dementia Rating Raw Score Descriptor      Sum of Boxes 0.5 MCI      Global Score 0.5 MCI      Quick Dementia Rating System Raw Score Descriptor      Sum of Boxes 0.5 MCI      Total Score 1 Normal  Geriatric Depression Scale - Short Form 1 Negative   Karla Pavone V. Nicole Kindred PsyD,  [place] Clinical Neuropsychologist

## 2020-04-02 NOTE — Progress Notes (Signed)
[facility]  Patient Name: Brenda Delgado MRN: [id number] Date of Birth: Jan 23, 1946 Age: 74 y.o. Education: 16 years  Referral Circumstances and Background Information  [name] is a 74 y.o., right-hand dominant, married woman with a history of breast cancer s/p lumpectomy, adjuvent radiation therapy, and tamoxifen. She was recently evaluated by Dr.Tat with our movement disorders program for tremor (head and hand) that started 15 years ago and was found to have cervical dystonia. MCI was suspected and she was referred for evaluation to clarify her current cognitive status.    On interview, the patient reported that she is concerned about her memory and thinking problems, she has noticed them over the past year but her husband has noticed them after the past 2 years. She notices word finding problems, and her husband notices some latency in her responses when he asks her questions, although she does have hearing problems and he wonders if that's the culprit. They don't appreciate any very significant memory memory problems, any difficulties with orientation, and she doesn't have significant difficulties with decision making and problem solving (although her husband does most of that). The patient reported that her mood is "good." She is on Paxil, which she said is because she taught school for 40 years and because Dr. Alfonso Ramus said it was "a good drug," but she's not sure if she's had any depressive issues. She largely denied much in the way of anxiety. She reported that her energy is fairly good. She wasn't able to summarize her sleep well, she is in bed for about 10 hours usually although she is often up throughout the night.   They are largely denying today that she is having any functional difficulties related to her memory problems. She is functional at her normal level around the house, she does the cooking, takes care of things around the house, and  does well with it. She manages her own medications and is reliable about doing so. She is still driving and has no accidents, near misses, or getting lost although she is anxious to go places she is not familiar with, which is a bit worse than in the past. Her husband manages all their money and has always done that. She is independent with respect to community utilization, grocery shopping, and the like.   Past Medical History and Review of Relevant Studies   Patient Active Problem List   Diagnosis Date Noted  . Abrasion of nose 02/02/2020  . Memory change 02/02/2020  . Genetic testing 08/13/2019  . Family history of ovarian cancer   . Family history of colon cancer   . Ductal carcinoma in situ (DCIS) of left breast 07/28/2019  . History of elevated glucose 07/22/2019  . Elevated LDL cholesterol level 07/22/2019  . Tremor of face and hands 07/22/2019  . Left ureteral stone 05/14/2019  . METATARSALGIA 04/15/2008  . FOOT PAIN, BILATERAL 04/15/2008   Review of Neuroimaging and Relevant Medical History: The patient has a CT head from 2017 but no more recent neuroimaging. That study shows reasonable brain volume and morphology for age without any significant leukoaraiosis.   Current Outpatient Medications  Medication Sig Dispense Refill  . ALBUTEROL IN Inhale into the lungs as needed.    Marland Kitchen aspirin EC 81 MG tablet Take 81 mg by mouth daily.    . Calcium-Vitamin D-Vitamin K 500-500-40 MG-UNT-MCG CHEW Chew 2 tablets by mouth daily.    . Cholecalciferol (VITAMIN D3) 50 MCG (2000 UT)  TABS Take 50 mcg by mouth 2 (two) times daily.    . Coenzyme Q10 (COQ10 PO) Take 1 capsule by mouth daily.    Marland Kitchen EPINEPHrine 0.3 mg/0.3 mL IJ SOAJ injection SMARTSIG:1 Pre-Filled Pen Syringe IM As Needed    . mupirocin ointment (BACTROBAN) 2 % Apply 1 application topically 2 (two) times daily. To right nare. 22 g 0  . pantoprazole (PROTONIX) 40 MG tablet Take 1 tablet (40 mg total) by mouth at bedtime. 90 tablet 3    . PARoxetine (PAXIL) 20 MG tablet Take 1 tablet (20 mg total) by mouth daily. 90 tablet 0  . tamoxifen (NOLVADEX) 10 MG tablet Take 1 tablet (10 mg total) by mouth daily. 90 tablet 3   No current facility-administered medications for this visit.   Family History  Problem Relation Age of Onset  . Skin cancer Mother   . Colon cancer Paternal Uncle        dx late 56s-60s  . Ovarian cancer Paternal Aunt 52  . Cancer Cousin        unk type   There is no  family history of dementia. Her father died when he was younger (83) and her mother lived into her 57s and none of them had dementia.There is a family history of psychiatric illness, she had a great uncle on her mothers side that committed suicide.  Psychosocial History  Developmental, Educational and Employment History: The patient is a native of [place] and reported a normal childhood with no history of abuse. She earned mainly A's and B's and was never held back, she didn't have learning problems. She earned a Environmental consultant. from [employer], elementary education. 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   Psychiatric History: The patient denied any psychiatric history apart from being on Paxil.   Substance Use History: The patient doesn't use substances.   Relationship History and Living Cimcumstances: The patient and her husband have been married for 50 years. The patient has two children, one lives in [place] and one lives in [place]. They have three grandchildren.   Mental Status and Behavioral Observations  Sensorium/Arousal: The patient's level of arousal was awake and alert. Hearing and vision were adequate for testing purposes, although the patient does typically use hearing aids and did not wear them because they need to be adjusted. Orientation: The patient was well oriented to person, place, time, and date.  Appearance: Dressed in appropriate, casual clothing with  reasonable grooming and hygiene.  Behavior: Pleasant, appropriate, was able to provide a fairly detailed history and personal timeline Speech/language: Speech was normal in rate, rhythm, volume, and prosody. Gait/Posture: Normal on exam with Dr. Carles Collet Movement: No signs of Parkinsonism on exam with Dr. Carles Collet Social Comportment: The patient was pleasant and appropriate Mood: Good Affect: Mainly euthymic, perhaps with a slight undercurrent of anxiety Thought process/content: Thought process was logical, linear, and goal-oriented. There was no loosening of associations or flight of ideas. Thought content was appropriate to the topics discussed Safety: There were no safety concerns at today's visit Insight: Brenda Delgado Cognitive Assessment  04/02/2020  Visuospatial/ Executive (0/5) 3  Naming (0/3) 3  Attention: Read list of digits (0/2) 2  Attention: Read list of letters (0/1) 1  Attention: Serial 7 subtraction starting at 100 (0/3) 3  Language: Repeat phrase (0/2) 0  Language : Fluency (0/1) 1  Abstraction (0/2) 2  Delayed Recall (0/5) 2  Orientation (0/6) 6  Total 23  Adjusted Score (based on education) 23   Test Procedures  Wide Range Achievement Test - 4             Word Reading Neuropsychological Assessment Battery  List Learning  Story Learning  Daily Living Memory  Naming  Digit Span Repeatable Battery for the Assessment of Neuropsychological Status (Form A)  Figure Copy  Judgment of Line Orientation  Coding  Figure Recall The Dot Counting Test A Random Letter Test Controlled Oral Word Association (F-A-S) Semantic Fluency (Animals) Trail Making Test A & B Complex Ideational Material Modified [place] Card Sorting Test Geriatric Depression Scale - Short Form Quick Dementia Rating System (completed by husband [name])  Plan  Brenda Delgado was seen for a psychiatric diagnostic evaluation and neuropsychological testing. She is a pleasant, 74 year old, left-hand  dominant woman with cervical dystonia and subjective memory changes over the past 2 years or so. On interview, she and her husband are reporting no more than very mild difficulties (mainly problems with word finding) and these do not appreciably impact her day to day functioning. Her husband does manage the finances, he always has. She is screening in the normal to perhaps mildly impaired range today for an individual with 16 years of education on the MoCA. Differential is MCI vs. Normal cognitive functioning. Full and complete note with impressions, recommendations, and interpretation of test data to follow.   Brenda Delgado Nicole Kindred, PsyD, [place] Clinical Neuropsychologist  Informed Consent and Coding/Compliance  Risks and benefits of the evaluation were discussed with the patient prior to all testing procedures. I conducted a clinical interview and neuropsychological testing (at least two tests) with Cydney Ok and Lamar Benes, B.S. (Technician) administered additional test procedures. The patient was able to tolerate the testing procedures and the patient (and/or family if applicable) is likely to benefit from further follow up to receive the diagnosis and treatment recommendations, which will be rendered at the next encounter. Billing below reflects technician time, my direct face-to-face time with the patient, time spent in test administration, and time spent in professional activities including but not limited to: neuropsychological test interpretation, integration of neuropsychological test data with clinical history, report preparation, treatment planning, care coordination, and review of diagnostically pertinent medical history or studies.   Services associated with this encounter: Clinical Interview [id number]) plus 60 minutes (60454; Neuropsychological Evaluation by Professional)  100 minutes (09811; Neuropsychological Evaluation by Professional, Adl.) 19 minutes (91478; Test Administration  by Professional) 30 minutes (29562; Neuropsychological Testing by Technician) 65 minutes (13086; Neuropsychological Testing by Technician, Adl.)

## 2020-04-02 NOTE — Progress Notes (Signed)
   Psychometrist Note   Cognitive testing was administered to Brenda Delgado by Lamar Benes, B.S. (Technician) under the supervision of Alphonzo Severance, Psy.D., ABN. Brenda Delgado was able to tolerate all test procedures. Dr. Nicole Kindred met with the patient as needed to manage any emotional reactions to the testing procedures. Rest breaks were offered.    The battery of tests administered was selected by Dr. Nicole Kindred with consideration to the patient's current level of functioning, the nature of her symptoms, emotional and behavioral responses during the interview, level of literacy, observed level of motivation/effort, and the nature of the referral question. This battery was communicated to the psychometrist. Communication between Dr. Nicole Kindred and the psychometrist was ongoing throughout the evaluation and Dr. Nicole Kindred was immediately accessible at all times. Dr. Nicole Kindred provided supervision to the technician on the date of this service, to the extent necessary to assure the quality of all services provided.    Brenda Delgado will return in approximately one week for an interactive feedback session with Dr. Nicole Kindred, at which time test performance, clinical impressions, and treatment recommendations will be reviewed in detail. The patient understands she can contact our office should she require our assistance before this time.   A total of 95 minutes of billable time were spent with Brenda Delgado by the technician, including test administration and scoring time. Billing for these services is reflected in Dr. Les Pou note.   This note reflects time spent with the psychometrician and does not include test scores, clinical history, or any interpretations made by Dr. Nicole Kindred. The full report will follow in a separate note.

## 2020-04-06 NOTE — Progress Notes (Signed)
[facility]  Patient Name: Brenda Delgado MRN: [id number] Date of Birth: September 03, 1945 Age: 74 y.o. Education: 26 years  Clinical Impressions  Brenda Delgado is a 74 y.o., right-hand dominant, married woman with a history of tremor and cervical dystonia who was recently evaluated by Dr. Carles Collet with our movement disorders program. She complained of memory problems, and MCI was suspected. She reported very mild symptoms that do not appreciably impact her functioning day-to-day. She has no recent neuroimaging but does have a CT head from 2018 that appears normal to my eye. On neuropsychological assessment, she demonstrated less than expected performance on memory measures. The profile shows mainly encoding issues although developing storage problems can present similarly. There were no other areas of abnormal performance. She screened negative for the presence of depression and her Global CDR is 0.5, which is mild cognitive impairment. Presentation viewed as consistent with a very mild level cognitive disorder. This could certainly be due to her chemotherapeutic regimen (long-term treatment with tamoxifen has been shown to cause verbal fluency and verbal memory deficits) or her mildly anticholinergic SSRI, but a prodromal progressive condition cannot be entirely excluded.    Diagnostic Impressions: Mild Cognitive Disorder  Recommendations to be discussed with patient  Your performance and presentation on assessment today were consistent with a very mild level cognitive problem involving mainly difficulties with memory encoding (I.e., getting information in). I would like to highlight that this is a mild problem at this point in time. You are still functioning at your normal level day-to-day and therefore, the best diagnosis is Mild Cognitive Impairment.   The major difference between mild cognitive impairment (MCI) and dementia is in severity and potential prognosis.  Once someone reaches a level of severity adequate to be diagnosed with a dementia, there is usually progression over time, though this may be years. On the other hand, mild cognitive impairment, while a significant risk for dementia in future, does not always progress to dementia, and in some instances stays the same or can even revert to normal. It is important to realize that if MCI is due to underlying Alzheimer's disease, it will most likely progress to dementia eventually. The rate of conversion to Alzheimer's dementia from amnestic MCI is about 15% per year versus the general population risk of conversion of 2% per year.   At this point, I am not certain what is causing your mild cognitive impairment, but my guess is that it may be related to your medication regimen. Treatment with Tamoxifen has been shown to potentially cause mild verbal memory and verbal fluency problems in the literature. You are also taking the SSRI medication Paxil, which is mildly anticholinergic. Given that you reported no depressive symptoms whatsoever, I would discuss with the providing provider the need for your Paxil. Given the need for your Tamoxifen to prevent substantial morbidity and mortality and the fact that many medications used to treat breast cancer have been shown to cause cognitive decline, there may not be alternatives. I would also like to highlight that I am not a medical provider nor am I licensed to prescribe medications or provide advice about medications and you should not make any changes to your medication regimen without talking to the providers who prescribed them.   MRI of the brain could be considered to better assess any structural changes since her CT scan in 2018, such as progressive volume loss, and to assess vascular load.   One other possibility is that this  is the start of a condition that may get worse over time. Past the age of 81, 20-30% of individuals have a clinically manifest dementia  syndrome, and therefore I would suggest that you engage in dietary and other lifestyle changes that have been shown to mitigate the chances of cognitive decline in the future.   There is now good quality evidence from at least one large scale study that a modified mediterranean diet may help slow cognitive decline. This is known as the "MIND" diet. The Mind diet is not so much a specific diet as it is a set of recommendations for things that you should and should not eat.   Foods that are ENCOURAGED on the MIND Diet:  Green, leafy vegetables: Aim for six or more servings per week. This includes kale, spinach, cooked greens and salads.  All other vegetables: Try to eat another vegetable in addition to the green leafy vegetables at least once a day. It is best to choose non-starchy vegetables because they have a lot of nutrients with a low number of calories.  Berries: Eat berries at least twice a week. There is a plethora of research on strawberries, and other berries such as blueberries, raspberries and blackberries have also been found to have antioxidant and brain health benefits.  Nuts: Try to get five servings of nuts or more each week. The creators of the [place] don't specify what kind of nuts to consume, but it is probably best to vary the type of nuts you eat to obtain a variety of nutrients. Peanuts are a legume and do not fall into this category.  Olive oil: Use olive oil as your main cooking oil. There may be other heart-healthy alternatives such as algae oil, though there is not yet sufficient research upon which to base a formal recommendation.  Whole grains: Aim for at least three servings daily. Choose minimally processed grains like oatmeal, quinoa, brown rice, whole-wheat pasta and 100% whole-wheat bread.  Fish: Eat fish at least once a week. It is best to choose fatty fish like salmon, sardines, trout, tuna and mackerel for their high amounts of omega-3 fatty acids.  Beans: Include  beans in at least four meals every week. This includes all beans, lentils and soybeans.  Poultry: Try to eat chicken or [place] at least twice a week. Note that fried chicken is not encouraged on the MIND diet.  Wine: Aim for no more than one glass of alcohol daily. Both red and white wine may benefit the brain. However, much research has focused on the red wine compound resveratrol, which may help protect against Alzheimer's disease.  Foods that are DISCOURAGED on the MIND Diet: Butter and margarine: Try to eat less than 1 tablespoon (about 14 grams) daily. Instead, try using olive oil as your primary cooking fat, and dipping your bread in olive oil with herbs.  Cheese: The MIND diet recommends limiting your cheese consumption to less than once per week.  Red meat: Aim for no more than three servings each week. This includes all beef, pork, lamb and products made from these meats.  [place]ace[place] Pro food: The MIND diet highly discourages fried food, especially the kind from fast-food restaurants. Limit your consumption to less than once per week.  Pastries and sweets: This includes most of the processed junk food and desserts you can think of. Ice cream, cookies, brownies, snack cakes, donuts, candy and more. Try to limit these to no more than four times a week.  Exercise  is one of the best medicines for promoting health and maintaining cognitive fitness at all stages in life. Exercise probably has the largest documented effect on brain health and performance of any lifestyle intervention. Studies have shown that even previously sedentary individuals who start exercising as late as age 38 show a significant survival benefit as compared to their non-exercising peers. In the [place], the current guidelines are for 30 minutes of moderate exercise per day, but increasing your activity level less than that may also be helpful. You do not have to get your 30 minutes of exercise in one shot and exercising for short  periods of time spread throughout the day can be helpful. Go for several walks, learn to dance, or do something else you enjoy that gets your body moving. Of course, if you have an underlying medical condition or there is any question about whether it is safe for you to exercise, you should consult a medical treatment provider prior to beginning exercise.   Test Findings  Test scores are summarized in additional documentation associated with this encounter. Test scores are relative to age, gender, and educational history as available and appropriate. There were no concerns about performance validity as all findings fell within normal expectations.   General Intellectual Functioning/Achievement:  Performance on single word reading presented at an average level, and was used as a basis of comparison for the patient's cognitive test performance.   Attention and Processing Efficiency: Indicators of attention reflected low average scores for digit repetition forward and an average score for digit repetition backward.   With respect to processing speed, timed number-symbol coding and simple numeric sequencing were average.  Language: Performance was normal on the fundamental ability of visual object confrontation naming. Generation of words was average in response to a phonemic prompt and low average in response to a semantic prompt.   Visuospatial Function: Performance on visuospatial and constructional indicators fell at an average level, overall, with average figure copy and average to high average judgment of angular line orientations.   Learning and Memory: Performance on measures of learning and memory was below expectations for this patient, with difficulties primarily on tasks emphasizing verbal memory encoding. She did seem to retain information well across time and as such, she is not manifesting a clear storage deficit at this time.   In the verbal realm, Ms. Milliron learned 2, 6, and 8  words of a 12-item word list across three learning trials, which is unusually low mainly due to low single trial learning. She retained 6 words on short delayed recall, which is low average, but then only 4 on long delayed recall, which is unusually low. Yes/no recognition discriminability for the words from the list versus false choices was good, near the high average range. Immediate recall for a brief short story was unusually low but then delayed recall was average. Memory for brief daily-living type information was low average (almost unusually low) on immediate recall with low average delayed recall. Recognition for this information was average.   Delayed recall for a modestly complex geometric figure was average.   Executive Functions: Performance on executive indicators was within normal limits with an average score overall on the EF Composite of the [place] of words in response to given letters was average. Alternating sequencing of numbers and letters of the alphabet was low average. Reasoning with complex verbal information on th Complex Ideational Material was errorless. Clock drawing was within normal limits with  reasonable face formation and placement of the hands and numbers.   Rating Scale(s): Ms. Ovitt screened negative for the presence of depression. Her husband characterized her as falling more at a normal than an MCI level problem. I was able to rate a CDR for her and while her Sum of Boxes is 0.5, her global score is also 0.5 because her difficulties involve memory loss, and that places her at an MCI level problem.   Viviano Simas Nicole Kindred PsyD, [place] Clinical Neuropsychologist

## 2020-04-09 ENCOUNTER — Encounter: Payer: BC Managed Care – PPO | Admitting: Counselor

## 2020-04-19 ENCOUNTER — Encounter: Payer: BC Managed Care – PPO | Admitting: Counselor

## 2020-04-20 ENCOUNTER — Encounter: Payer: BC Managed Care – PPO | Admitting: Counselor

## 2020-05-04 ENCOUNTER — Other Ambulatory Visit: Payer: Self-pay

## 2020-05-04 ENCOUNTER — Encounter: Payer: Self-pay | Admitting: Counselor

## 2020-05-04 ENCOUNTER — Ambulatory Visit (INDEPENDENT_AMBULATORY_CARE_PROVIDER_SITE_OTHER): Payer: BC Managed Care – PPO | Admitting: Counselor

## 2020-05-04 DIAGNOSIS — G3184 Mild cognitive impairment, so stated: Secondary | ICD-10-CM | POA: Diagnosis not present

## 2020-05-04 NOTE — Patient Instructions (Addendum)
Your performance and presentation on assessment today were consistent with a very mild level cognitive problem involving mainly difficulties with memory encoding (I.e., getting information in). I would like to highlight that this is a mild problem at this point in time. You are still functioning at your normal level day-to-day and therefore, the best diagnosis is Mild Cognitive Impairment.   The major difference between mild cognitive impairment (MCI) and dementia is in severity and potential prognosis. Once someone reaches a level of severity adequate to be diagnosed with a dementia, there is usually progression over time, though this may be years. On the other hand, mild cognitive impairment, while a significant risk for dementia in future, does not always progress to dementia, and in some instances stays the same or can even revert to normal.It is important to realize that if MCI is due to underlying Alzheimer's disease, it will most likely progress to dementia eventually. The rate of conversion to Alzheimer's dementiafrom amnestic MCI is about 15% per year versus the general population risk of conversion of 2% per year.  At this point, I am not certain what is causing your mild cognitive impairment, but my guess is that it may be related to your medication regimen. Treatment with Tamoxifen has been shown to potentially cause mild verbal memory and verbal fluency problems in the literature. You are also taking the SSRI medication Paxil, which is mildly anticholinergic. Given that you reported no depressive symptoms whatsoever, I would discuss with the providing provider the need for your Paxil. Given the need for your Tamoxifen to prevent substantial morbidity and mortality and the fact that many medications used to treat breast cancer have been shown to cause cognitive decline, there may not be alternatives. I would also like to highlight that I am not a medical provider nor am I licensed to prescribe  medications or provide advice about medications and you should not make any changes to your medication regimen without talking to the providers who prescribed them.   If you are concerned about your cognitive functioning, there are a number of different things that you can do to minimize your chance of decline in the future. I tell you this not because I think you are at particular risk, but simply for informational purposes:  There is now good quality evidence from at least one large scale study that a modified mediterranean diet may help slow cognitive decline. This is known as the "MIND" diet. The Mind diet is not so much a specific diet as it is a set of recommendations for things that you should and should not eat.   Foods that are ENCOURAGED on the MIND Diet:  Green, leafy vegetables: Aim for six or more servings per week. This includes kale, spinach, cooked greens and salads.  All other vegetables: Try to eat another vegetable in addition to the green leafy vegetables at least once a day. It is best to choose non-starchy vegetables because they have a lot of nutrients with a low number of calories.  Berries: Eat berries at least twice a week. There is a plethora of research on strawberries, and other berries such as blueberries, raspberries and blackberries have also been found to have antioxidant and brain health benefits.  Nuts: Try to get five servings of nuts or more each week. The creators of the MIND diet don't specify what kind of nuts to consume, but it is probably best to vary the type of nuts you eat to obtain a variety of  nutrients. Peanuts are a legume and do not fall into this category.  Olive oil: Use olive oil as your main cooking oil. There may be other heart-healthy alternatives such as algae oil, though there is not yet sufficient research upon which to base a formal recommendation.  Whole grains: Aim for at least three servings daily. Choose minimally processed grains like  oatmeal, quinoa, brown rice, whole-wheat pasta and 100% whole-wheat bread.  Fish: Eat fish at least once a week. It is best to choose fatty fish like salmon, sardines, trout, tuna and mackerel for their high amounts of omega-3 fatty acids.  Beans: Include beans in at least four meals every week. This includes all beans, lentils and soybeans.  Poultry: Try to eat chicken or [place] at least twice a week. Note that fried chicken is not encouraged on the MIND diet.  Wine: Aim for no more than one glass of alcohol daily. Both red and white wine may benefit the brain. However, much research has focused on the red wine compound resveratrol, which may help protect against Alzheimer's disease.  Foods that are DISCOURAGED on the MIND Diet: Butter and margarine: Try to eat less than 1 tablespoon (about 14 grams) daily. Instead, try using olive oil as your primary cooking fat, and dipping your bread in olive oil with herbs.  Cheese: The MIND diet recommends limiting your cheese consumption to less than once per week.  Red meat: Aim for no more than three servings each week. This includes all beef, pork, lamb and products made from these meats.  Mace[place] Pro food: The MIND diet highly discourages fried food, especially the kind from fast-food restaurants. Limit your consumption to less than once per week.  Pastries and sweets: This includes most of the processed junk food and desserts you can think of. Ice cream, cookies, brownies, snack cakes, donuts, candy and more. Try to limit these to no more than four times a week.  Exercise is one of the best medicines for promoting health and maintaining cognitive fitness at all stages in life. Exercise probably has the largest documented effect on brain health and performance of any lifestyle intervention. Studies have shown that even previously sedentary individuals who start exercising as late as age 41 show a significant survival benefit as compared to their non-exercising  peers. In the [place], the current guidelines are for 30 minutes of moderate exercise per day, but increasing your activity level less than that may also be helpful. You do not have to get your 30 minutes of exercise in one shot and exercising for short periods of time spread throughout the day can be helpful. Go for several walks, learn to dance, or do something else you enjoy that gets your body moving. Of course, if you have an underlying medical condition or there is any question about whether it is safe for you to exercise, you should consult a medical treatment provider prior to beginning exercise.

## 2020-05-04 NOTE — Progress Notes (Signed)
NEUROPSYCHOLOGY FEEDBACK NOTE [facility]  Feedback Note: I met with Cydney Ok to review the findings resulting from her neuropsychological evaluation. Since the last appointment, she has been busy. She was helping to care for grandbabies. Her husband was unable to make the appointment related to a sinus infection. Time was spent reviewing the impressions and recommendations that are detailed in the evaluation report. We discussed impression of very mild memory problems, and while the possibility that these will get worse cannot be entirely excluded, I think it is more likely that they are due to reversible causes. I took time to explain the findings and answer all the patient's questions. I encouraged Ms. Waltman to contact me should she have any further questions or if further follow up is desired.   Current Medications and Medical History   Current Outpatient Medications  Medication Sig Dispense Refill  . ALBUTEROL IN Inhale into the lungs as needed.    Marland Kitchen aspirin EC 81 MG tablet Take 81 mg by mouth daily.    . Calcium-Vitamin D-Vitamin K 500-500-40 MG-UNT-MCG CHEW Chew 2 tablets by mouth daily.    . Cholecalciferol (VITAMIN D3) 50 MCG (2000 UT) TABS Take 50 mcg by mouth 2 (two) times daily.    . Coenzyme Q10 (COQ10 PO) Take 1 capsule by mouth daily.    Marland Kitchen EPINEPHrine 0.3 mg/0.3 mL IJ SOAJ injection SMARTSIG:1 Pre-Filled Pen Syringe IM As Needed    . mupirocin ointment (BACTROBAN) 2 % Apply 1 application topically 2 (two) times daily. To right nare. 22 g 0  . pantoprazole (PROTONIX) 40 MG tablet Take 1 tablet (40 mg total) by mouth at bedtime. 90 tablet 3  . PARoxetine (PAXIL) 20 MG tablet Take 1 tablet (20 mg total) by mouth daily. 90 tablet 0  . tamoxifen (NOLVADEX) 10 MG tablet Take 1 tablet (10 mg total) by mouth daily. 90 tablet 3   No current facility-administered medications for this visit.   Patient Active Problem List   Diagnosis Date Noted  . Abrasion of nose  02/02/2020  . Memory change 02/02/2020  . Genetic testing 08/13/2019  . Family history of ovarian cancer   . Family history of colon cancer   . Ductal carcinoma in situ (DCIS) of left breast 07/28/2019  . History of elevated glucose 07/22/2019  . Elevated LDL cholesterol level 07/22/2019  . Tremor of face and hands 07/22/2019  . Left ureteral stone 05/14/2019  . METATARSALGIA 04/15/2008  . FOOT PAIN, BILATERAL 04/15/2008   Mental Status and Behavioral Observations  BONETTA MOSTEK presented to the clinic on time. She was alert and oriented to person, time, place, and situation. Speech was normal in rate, rhythm, volume, and prosody. Self-reported mood was "good" and affect was congruent with reported mood. Thought process was logical and goal-oriented and thought content was appropriate to the topics discussed. There were no safety concerns identified at today's encounter, such as thoughts of harming self or others.   Plan  Feedback provided regarding the patient's neuropsychological evaluation. She has very mild memory problems that could be due to a number of different factors including medication side effects. She also is having some hip pain at night that does interfere with the quality of her sleep, which may be a factor. Index of suspicion for a progressive condition is low but cannot be entirely excluded and I counseled her about MIND diet, exercise, and importance of managing cerebrovascular risk factors. MITCHELL EPLING was encouraged to contact me if  any questions arise or if further follow up is desired.   Viviano Simas Nicole Kindred, PsyD, ABN Clinical Neuropsychologist  Service(s) Provided at This Encounter: 27 minutes [phone]; Psychotherapy with patient/family)

## 2020-05-05 DIAGNOSIS — Z0120 Encounter for dental examination and cleaning without abnormal findings: Secondary | ICD-10-CM | POA: Diagnosis not present

## 2020-05-13 DIAGNOSIS — Z961 Presence of intraocular lens: Secondary | ICD-10-CM | POA: Diagnosis not present

## 2020-05-13 DIAGNOSIS — H353132 Nonexudative age-related macular degeneration, bilateral, intermediate dry stage: Secondary | ICD-10-CM | POA: Diagnosis not present

## 2020-05-13 DIAGNOSIS — H43812 Vitreous degeneration, left eye: Secondary | ICD-10-CM | POA: Diagnosis not present

## 2020-05-17 DIAGNOSIS — R69 Illness, unspecified: Secondary | ICD-10-CM | POA: Diagnosis not present

## 2020-05-19 ENCOUNTER — Telehealth: Payer: Self-pay | Admitting: *Deleted

## 2020-05-19 NOTE — Telephone Encounter (Signed)
Pt called with c/o redness to left lumpectomy scar and "fluttering". No fever or swelling noted by pt.  Pt did note seroma had to be drained "a couple of times". Msg sent to Dr. [name] and nurse April regarding pt complaints. No further needs voiced.

## 2020-05-20 DIAGNOSIS — L7634 Postprocedural seroma of skin and subcutaneous tissue following other procedure: Secondary | ICD-10-CM | POA: Diagnosis not present

## 2020-05-20 DIAGNOSIS — N6489 Other specified disorders of breast: Secondary | ICD-10-CM | POA: Diagnosis not present

## 2020-06-05 ENCOUNTER — Other Ambulatory Visit: Payer: Self-pay | Admitting: Family Medicine

## 2020-06-05 DIAGNOSIS — F418 Other specified anxiety disorders: Secondary | ICD-10-CM

## 2020-06-07 NOTE — Telephone Encounter (Signed)
Please see message and advise.  Thank you. Last OV 02/02/20 Last fill 03/08/20  #90/0

## 2020-06-09 DIAGNOSIS — L821 Other seborrheic keratosis: Secondary | ICD-10-CM | POA: Diagnosis not present

## 2020-06-09 DIAGNOSIS — L820 Inflamed seborrheic keratosis: Secondary | ICD-10-CM | POA: Diagnosis not present

## 2020-06-09 DIAGNOSIS — L814 Other melanin hyperpigmentation: Secondary | ICD-10-CM | POA: Diagnosis not present

## 2020-06-09 DIAGNOSIS — L570 Actinic keratosis: Secondary | ICD-10-CM | POA: Diagnosis not present

## 2020-06-15 ENCOUNTER — Other Ambulatory Visit: Payer: Self-pay | Admitting: Family Medicine

## 2020-06-15 NOTE — Telephone Encounter (Signed)
Patient is wanting to see if prescription can be sent in for a 90 day supply instead of 30 days. Please call [phone] if you have any questions.

## 2020-07-06 ENCOUNTER — Encounter: Payer: Self-pay | Admitting: Family Medicine

## 2020-07-06 DIAGNOSIS — D0592 Unspecified type of carcinoma in situ of left breast: Secondary | ICD-10-CM | POA: Diagnosis not present

## 2020-07-06 DIAGNOSIS — R251 Tremor, unspecified: Secondary | ICD-10-CM | POA: Diagnosis not present

## 2020-07-06 DIAGNOSIS — R928 Other abnormal and inconclusive findings on diagnostic imaging of breast: Secondary | ICD-10-CM | POA: Diagnosis not present

## 2020-07-09 ENCOUNTER — Telehealth: Payer: Self-pay | Admitting: *Deleted

## 2020-07-09 NOTE — Telephone Encounter (Signed)
VM left by pt stating she needs to reschedule her appointment on 12/6 with Dr Lindi Adie - " because I just saw Dr [name] this past Tuesday and I am supposed to alternate between him and Dr Lindi Adie so I am seen every 6 months "  This RN reviewed chart- and sent an Urgent scheduling request to reschedule appointment to first week in June 2022.  Called pt and spoke with husband per above.  No further needs at this time.

## 2020-07-11 NOTE — Progress Notes (Incomplete)
Patient Care Team: Libby Maw, MD as PCP - General (Family Medicine) Mauro Kaufmann, RN as Oncology Nurse Navigator Rockwell Germany, RN as Oncology Nurse Navigator Alphonsa Overall, MD as Consulting Physician (General Surgery) Nicholas Lose, MD as Consulting Physician (Hematology and Oncology) Eppie Gibson, MD as Attending Physician (Radiation Oncology)  DIAGNOSIS: No diagnosis found.  SUMMARY OF ONCOLOGIC HISTORY: Oncology History  Ductal carcinoma in situ (DCIS) of left breast  07/28/2019 Initial Diagnosis   Routine screening mammogram detected a 1.9cm group of calcifications in the left breast. Biopsy showed DCIS, high grade, ER+ 95%, PR+ 90%.   07/30/2019 Cancer Staging   Staging form: Breast, AJCC 8th Edition - Clinical: Stage 0 (cTis (DCIS), cN0, cM0, G3, ER+, PR+, HER2: Not Assessed)    08/12/2019 Genetic Testing   Negative genetic testing. No pathogenic variants identified, VUS in MSH2 Gain (Exons 11-16) identified on the [facility] Common Hereditary Cancers Panel + Breast Cancer STAT Panel. The STAT Breast cancer panel offered by [facility] includes sequencing and rearrangement analysis for the following 9 genes:  ATM, BRCA1, BRCA2, CDH1, CHEK2, PALB2, PTEN, STK11 and TP53.  The Common Hereditary Cancers Panel offered by [facility] includes sequencing and/or deletion duplication testing of the following 47 genes: APC, ATM, AXIN2, BARD1, BMPR1A, BRCA1, BRCA2, BRIP1, CDH1, CDKN2A (p14ARF), CDKN2A (p16INK4a), CKD4, CHEK2, CTNNA1, DICER1, EPCAM (Deletion/duplication testing only), GREM1 (promoter region deletion/duplication testing only), KIT, MEN1, MLH1, MSH2, MSH3, MSH6, MUTYH, NBN, NF1, NHTL1, PALB2, PDGFRA, PMS2, POLD1, POLE, PTEN, RAD50, RAD51C, RAD51D, SDHB, SDHC, SDHD, SMAD4, SMARCA4. STK11, TP53, TSC1, TSC2, and VHL.  The following genes were evaluated for sequence changes only: SDHA and HOXB13 c.251G>A variant only. The report date is 08/12/2019.   08/12/2019 Cancer Staging    Staging form: Breast, AJCC 8th Edition - Pathologic stage from 08/12/2019: Stage 0 (pTis (DCIS), pN0, cM0, ER+, PR+)   08/12/2019 Surgery   Left Breast Lumpectomy [name]) [phone]): high grade DCIS with calcifications and necrosis, spanning 1.5cm. Additional left lateral, medial, and superior magrins showed fibrocystic changes. No regional lymph nodes were examined. ER+ PR+.   09/10/2019 - 10/07/2019 Radiation Therapy   The patient initially received a dose of 40.05 Gy in 15 fractions to the breast using whole-breast tangent fields. This was delivered using a 3-D conformal technique. The pt received a boost delivering an additional 10 Gy in 5 fractions using a electron boost with 82mV electrons. The total dose was 50.05 Gy.   10/2019 - 10/2024 Anti-estrogen oral therapy   Tamoxifen     CHIEF COMPLIANT: Follow-up of left breast DCIS on tamoxifen  INTERVAL HISTORY: Brenda ZYNDAis a 74y.o. with above-mentioned history of left breast DCIS for which she underwent a lumpectomy, radiation, and is currently on antiestrogen therapy with tamoxifen. She presents to the clinic today for follow-up.   ALLERGIES:  is allergic to pravastatin, erythromycin, iohexol, amoxicillin-pot clavulanate, codeine, moxifloxacin, and sulfamethoxazole.  MEDICATIONS:  Current Outpatient Medications  Medication Sig Dispense Refill  . ALBUTEROL IN Inhale into the lungs as needed.    .Marland Kitchenaspirin EC 81 MG tablet Take 81 mg by mouth daily.    . Calcium-Vitamin D-Vitamin K 500-500-40 MG-UNT-MCG CHEW Chew 2 tablets by mouth daily.    . Cholecalciferol (VITAMIN D3) 50 MCG (2000 UT) TABS Take 50 mcg by mouth 2 (two) times daily.    . Coenzyme Q10 (COQ10 PO) Take 1 capsule by mouth daily.    .Marland KitchenEPINEPHrine 0.3 mg/0.3 mL IJ SOAJ injection SMARTSIG:1 Pre-Filled Pen  Syringe IM As Needed    . mupirocin ointment (BACTROBAN) 2 % Apply 1 application topically 2 (two) times daily. To right nare. 22 g 0  . pantoprazole (PROTONIX) 40 MG  tablet TAKE 1 TABLET BY MOUTH AT BEDTIME 30 tablet 0  . PARoxetine (PAXIL) 20 MG tablet Take 1 tablet by mouth once daily 90 tablet 0  . tamoxifen (NOLVADEX) 10 MG tablet Take 1 tablet (10 mg total) by mouth daily. 90 tablet 3   No current facility-administered medications for this visit.    PHYSICAL EXAMINATION: ECOG PERFORMANCE STATUS: {CHL ONC ECOG PS:[id number]}  There were no vitals filed for this visit. There were no vitals filed for this visit.  BREAST:*** No palpable masses or nodules in either right or left breasts. No palpable axillary supraclavicular or infraclavicular adenopathy no breast tenderness or nipple discharge. (exam performed in the presence of a chaperone)  LABORATORY DATA:  I have reviewed the data as listed CMP Latest Ref Rng & Units 02/02/2020 07/30/2019 07/22/2019  Glucose 70 - 99 mg/dL 85 97 98  BUN 6 - 23 mg/dL _0 Creatinine 0.40 - 1.20 mg/dL 0.67 0.67 0.61  Sodium 135 - 145 mEq/L 141 143 143  Potassium 3.5 - 5.1 mEq/L 4.8 4.4 4.8  Chloride 96 - 112 mEq/L 103 104 105  CO2 19 - 32 mEq/L 32 30 31  Calcium 8.4 - 10.5 mg/dL 9.2 8.9 9.5  Total Protein 6.0 - 8.3 g/dL 6.5 6.5 6.4  Total Bilirubin 0.2 - 1.2 mg/dL 0.3 0.4 0.5  Alkaline Phos 39 - 117 U/L 49 72 67  AST 0 - 37 U/L _1 ALT 0 - 35 U/L _2 Lab Results  Component Value Date   WBC 3.6 (L) 02/02/2020   HGB 13.0 02/02/2020   HCT 39.8 02/02/2020   MCV 92.2 02/02/2020   PLT 281.0 02/02/2020   NEUTROABS 1.7 07/30/2019    ASSESSMENT & PLAN:  No problem-specific Assessment & Plan notes found for this encounter.    No orders of the defined types were placed in this encounter.  The patient has a good understanding of the overall plan. she agrees with it. she will call with any problems that may develop before the next visit here.  Total time spent: *** mins including face to face time and time spent for planning, charting and coordination of care  Nicholas Lose, MD  07/11/2020  I, Cloyde Reams Dorshimer, am acting as scribe for Dr. Nicholas Lose.  {insert scribe attestation}

## 2020-07-12 ENCOUNTER — Telehealth: Payer: Self-pay | Admitting: Hematology and Oncology

## 2020-07-12 ENCOUNTER — Inpatient Hospital Stay: Payer: BC Managed Care – PPO | Admitting: Hematology and Oncology

## 2020-07-12 NOTE — Assessment & Plan Note (Deleted)
07/28/2019:Routine screening mammogram detected a 1.9cm group of calcifications in the left breast. Biopsy showed DCIS, high grade, ER+ 95%, PR+ 90%. Tis NX stage 0 08/12/2019: Left lumpectomy: High-grade DCIS with necrosis and calcifications 1.5 cm, margins negative, ER 95%, PR 90%  Treatment plan: 1.Adjuvant radiation therapy 09/11/2019-10/06/2019 2.Follow-up adjuvant antiestrogen therapy with tamoxifen x5 years  Tamoxifen toxicities:  Breast cancer surveillance: 1.  Breast exam 07/12/2020: Benign 2. mammogram at [facility] 07/06/2020: Benign breast density category B  Return to clinic in 1 year for follow-up

## 2020-07-12 NOTE — Telephone Encounter (Signed)
Called pt per 12/3 sch msg - pt is aware of new appt date and time .

## 2020-07-13 ENCOUNTER — Other Ambulatory Visit: Payer: Self-pay | Admitting: Family

## 2020-08-16 ENCOUNTER — Other Ambulatory Visit: Payer: Self-pay | Admitting: Family

## 2020-08-17 DIAGNOSIS — M1711 Unilateral primary osteoarthritis, right knee: Secondary | ICD-10-CM | POA: Diagnosis not present

## 2020-08-17 DIAGNOSIS — S139XXA Sprain of joints and ligaments of unspecified parts of neck, initial encounter: Secondary | ICD-10-CM | POA: Diagnosis not present

## 2020-08-18 ENCOUNTER — Other Ambulatory Visit: Payer: Self-pay

## 2020-08-18 NOTE — Progress Notes (Signed)
Brenda Delgado is a 75 y.o. female  Chief Complaint  Patient presents with   Acute Visit    C/o having pain, unable to hear out of them, is unbalanced in both ears x 2 weeks.   Also having ST, HA x 2 weeks.  She has taken Tylenol and Dramamine with little relief.      HPI: Brenda Delgado is a 75 y.o. female patient of my colleague Dr. Ethelene Hal who complains of 2wk h/o B/L ear pain, decreased hearing, and issues with balance. She also endorses sore throat, PND, and headache x 2 wks. Symptoms started with Lt sided headache just before Christmas.  No fever, chills but pt states in the past few weeks she fluctuates between hot and cold constantly. No body aches. No dizziness. She endorses lightheadedness if she bends over or sits up from laying down.  She did not get tested for covid for these symptoms.   She has tried taking tylenol and dramamine x 1 dose with minimal relief. She has been trying to drink more water.  Home pulse ox in high 90's. No cough, SOB, wheeze.   Past Medical History:  Diagnosis Date   Allergy    Anxiety    Arthritis    right knee   Cancer ([place]) 07/2019   left breast DCIS   Complication of anesthesia    per pt with surgery 05-14-2019 right facial swelling and lip/ mouth blister's   Constipation    Depression    Environmental and seasonal allergies    Family history of colon cancer    Family history of ovarian cancer    GERD (gastroesophageal reflux disease)    Hiatal hernia    History of kidney stones    Left ureteral stone    Mild asthma    Nephrolithiasis    Nocturia    PONV (postoperative nausea and vomiting)    Pre-diabetes    Renal calculus, left    Wears glasses    Wears hearing aid in both ears     Past Surgical History:  Procedure Laterality Date   ABDOMINAL HYSTERECTOMY     ANTERIOR CERVICAL DECOMP/DISCECTOMY FUSION  01-06-2010   @[facility]    C4--5   BREAST LUMPECTOMY WITH RADIOACTIVE SEED LOCALIZATION Left 08/12/2019    Procedure: LEFT BREAST LUMPECTOMY WITH RADIOACTIVE SEED LOCALIZATION;  Surgeon: Alphonsa Overall, MD;  Location: [place];  Service: General;  Laterality: Left;   CARPAL TUNNEL RELEASE Right 2000   cataracts  2018   bilateral   CYSTOCELE REPAIR  09/11/2011   Procedure: ANTERIOR REPAIR (CYSTOCELE);  Surgeon: Olga Millers, MD;  Location: [facility];  Service: Gynecology;  Laterality: N/A;   CYSTOSCOPY  09/11/2011   Procedure: CYSTOSCOPY;  Surgeon: Olga Millers, MD;  Location: [facility];  Service: Gynecology;  Laterality: N/A;   CYSTOSCOPY WITH RETROGRADE PYELOGRAM, URETEROSCOPY AND STENT PLACEMENT Left 05/28/2019   Procedure: CYSTOSCOPY WITH RETROGRADE PYELOGRAM, URETEROSCOPY AND STENT REPLACEMENT;  Surgeon: Alexis Frock, MD;  Location[facility];  Service: Urology;  Laterality: Left;   CYSTOSCOPY WITH STENT PLACEMENT Left 05/14/2019   Procedure: CYSTOSCOPY WITH STENT PLACEMENT, RETROGRADE;  Surgeon: Bjorn Loser, MD;  Location: [facility];  Service: Urology;  Laterality: Left;   ELBOW SURGERY Right    EXCISION VULVA CYST Bilateral 01-16-2005  @[facility]    HOLMIUM LASER APPLICATION Left [id number]   Procedure: HOLMIUM LASER APPLICATION;  Surgeon: Alexis Frock, MD;  Location: [facility];  Service: Urology;  Laterality: Left;   KNEE ARTHROSCOPY Right 2005;    02-11-2013   KNEE SURGERY Right 08-20-2012   @[facility]    peroneal nerve neurolysis proximal tibia/ fibular articular branch    LAPAROSCOPIC CHOLECYSTECTOMY  1990s   SACROSPINOUS LIGAMENT FIXATION  04-05-2017   @[facility]    UPHOLD vaginal mesh and  Anterior Repair   SALPINGOOPHORECTOMY  09/11/2011   Procedure: SALPINGO OOPHERECTOMY;  Surgeon: Olga Millers, MD;  Location: [facility];  Service: Gynecology;  Laterality: Bilateral;   SHOULDER SURGERY Right    TONSILLECTOMY     VAGINAL HYSTERECTOMY  09/11/2011   Procedure: HYSTERECTOMY VAGINAL;  Surgeon: Olga Millers, MD;  Location: [facility];   Service: Gynecology;  Laterality: N/A;   VULVA Milagros Loll BIOPSY  09/11/2011   Procedure: VULVAR BIOPSY;  Surgeon: Olga Millers, MD;  Location: [facility];  Service: Gynecology;  Laterality: Left;  sebaceous cyst excision left vula    Social History   Socioeconomic History   Marital status: Married    Spouse name: Not on file   Number of children: Not on file   Years of education: Not on file   Highest education level: Not on file  Occupational History   Not on file  Tobacco Use   Smoking status: Never Smoker   Smokeless tobacco: Never Used  Vaping Use   Vaping Use: Never used  Substance and Sexual Activity   Alcohol use: Yes    Comment: occasional   Drug use: Never   Sexual activity: Not on file  Other Topics Concern   Not on file  Social History Narrative   Right handed   Social Determinants of Health   Financial Resource Strain: Not on file  Food Insecurity: Not on file  Transportation Needs: Not on file  Physical Activity: Not on file  Stress: Not on file  Social Connections: Not on file  Intimate Partner Violence: Not on file    Family History  Problem Relation Age of Onset   Skin cancer Mother    Colon cancer Paternal Uncle        dx late 58s-60s   Ovarian cancer Paternal Aunt 37   Cancer Cousin        unk type     Immunization History  Administered Date(s) Administered   Influenza-Unspecified 05/19/2020   PFIZER SARS-COV-2 Vaccination 08/19/2019, 09/17/2019, 06/15/2020    Outpatient Encounter Medications as of 08/19/2020  Medication Sig   ALBUTEROL IN Inhale into the lungs as needed.   Ascorbic Acid (VITAMIN C) 1000 MG tablet Take 1,000 mg by mouth daily.   aspirin EC 81 MG tablet Take 81 mg by mouth daily.   Calcium-Vitamin D-Vitamin K 161-096-04 MG-UNT-MCG CHEW Chew 2 tablets by mouth daily.   Cholecalciferol (VITAMIN D3) 50 MCG (2000 UT) TABS Take 50 mcg by mouth 2 (two) times daily.   Coenzyme Q10 (COQ10 PO) Take 1 capsule  by mouth daily.   EPINEPHrine 0.3 mg/0.3 mL IJ SOAJ injection SMARTSIG:1 Pre-Filled Pen Syringe IM As Needed   pantoprazole (PROTONIX) 40 MG tablet TAKE 1 TABLET BY MOUTH AT BEDTIME   PARoxetine (PAXIL) 20 MG tablet Take 1 tablet by mouth once daily   tamoxifen (NOLVADEX) 10 MG tablet Take 1 tablet (10 mg total) by mouth daily.   zinc gluconate 50 MG tablet Take 50 mg by mouth daily.   mupirocin ointment (BACTROBAN) 2 % Apply 1 application topically 2 (two) times daily. To right nare. (Patient not taking: Reported on 08/19/2020)   No facility-administered  encounter medications on file as of 08/19/2020.     ROS: Pertinent positives and negatives noted in HPI. Remainder of ROS non-contributory    Allergies  Allergen Reactions   Pravastatin     Other reaction(s): Myalgias (intolerance) 2016   Erythromycin Nausea And Vomiting    Cramps    Iohexol Hives     Code: HIVES, Desc: Pt's husband called when they got home from pt's CEPI #1.  Pt w/ hives/itching.  Instructed on taking Benadryl now, 1hr and q6hrs prn.  Instructed to add IV Contrast to allergy list, esp for CT scans, and to get pre-med info in the future.  jkl, Onset Date: [id number]    Amoxicillin-Pot Clavulanate Other (See Comments)    GI Upset     Codeine Nausea And Vomiting   Moxifloxacin Itching   Sulfamethoxazole Itching and Rash    BP 120/66    Pulse 86    Temp (!) 97.4 F (36.3 C) (Temporal)    Ht 4\' 11"  (1.499 m)    Wt 116 lb 9.6 oz (52.9 kg)    SpO2 98%    BMI 23.55 kg/m   Physical Exam Constitutional:      General: She is not in acute distress.    Appearance: Normal appearance. She is not ill-appearing.  HENT:     Right Ear: Tympanic membrane, ear canal and external ear normal. There is no impacted cerumen.     Left Ear: Tympanic membrane, ear canal and external ear normal. There is no impacted cerumen.     Nose: Rhinorrhea present.     Right Turbinates: Swollen.     Left Turbinates: Swollen.      Right Sinus: Maxillary sinus tenderness present.     Left Sinus: Maxillary sinus tenderness and frontal sinus tenderness present.     Mouth/Throat:     Pharynx: Posterior oropharyngeal erythema present. No pharyngeal swelling or oropharyngeal exudate.  Pulmonary:     Effort: Pulmonary effort is normal. No respiratory distress.     Breath sounds: Normal breath sounds.  Neurological:     Mental Status: She is alert and oriented to person, place, and time.  Psychiatric:        Mood and Affect: Mood normal.        Behavior: Behavior normal.      A/P:  1. Acute non-recurrent maxillary sinusitis - cont supportive care - strongly recommend covid testing and quarantine until result available Rx: - azithromycin (ZITHROMAX) 250 MG tablet; 2 tabs po x 1 day then 1 tab po daily  Dispense: 6 tablet; Refill: 0 - predniSONE (DELTASONE) 20 MG tablet; Take 2 tablets (40 mg total) by mouth daily with breakfast for 5 days.  Dispense: 10 tablet; Refill: 0 - f/u if symptoms worsen or do not improve in 7-10 days   This visit occurred during the SARS-CoV-2 public health emergency.  Safety protocols were in place, including screening questions prior to the visit, additional usage of staff PPE, and extensive cleaning of exam room while observing appropriate contact time as indicated for disinfecting solutions.

## 2020-08-19 ENCOUNTER — Encounter: Payer: Self-pay | Admitting: Family Medicine

## 2020-08-19 ENCOUNTER — Ambulatory Visit (INDEPENDENT_AMBULATORY_CARE_PROVIDER_SITE_OTHER): Payer: Medicare Other | Admitting: Family Medicine

## 2020-08-19 VITALS — BP 120/66 | HR 86 | Temp 97.4°F | Ht 59.0 in | Wt 116.6 lb

## 2020-08-19 DIAGNOSIS — J0100 Acute maxillary sinusitis, unspecified: Secondary | ICD-10-CM | POA: Diagnosis not present

## 2020-08-19 MED ORDER — PREDNISONE 20 MG PO TABS: 40.0000 mg | ORAL_TABLET | Freq: Every day | ORAL | 0 refills | Status: AC

## 2020-08-19 MED ORDER — AZITHROMYCIN 250 MG PO TABS: ORAL_TABLET | ORAL | 0 refills | Status: DC

## 2020-08-22 ENCOUNTER — Ambulatory Visit (INDEPENDENT_AMBULATORY_CARE_PROVIDER_SITE_OTHER): Payer: Medicare Other | Admitting: Family Medicine

## 2020-08-22 VITALS — BP 128/74 | HR 74 | Temp 99.2°F | Resp 16

## 2020-08-22 DIAGNOSIS — N390 Urinary tract infection, site not specified: Secondary | ICD-10-CM

## 2020-08-22 DIAGNOSIS — R300 Dysuria: Secondary | ICD-10-CM

## 2020-08-22 DIAGNOSIS — R110 Nausea: Secondary | ICD-10-CM

## 2020-08-22 LAB — U/A AUTO DIPSTICK ONLY, ONSITE
Bilirubin, Urine: NEGATIVE
Glucose, Urine: NEGATIVE mg/dL
Ketones, URN: NEGATIVE mg/dL
Nitrite, URN: POSITIVE — AB
Protein: NEGATIVE mg/dL
Specific Gravity, Urine: 1.02 (ref 1.005–1.030)
Urobilinogen, URN: 0.2 E.U./dL (ref 0.2–1.0)
pH, URN: 6 (ref 5.0–8.0)

## 2020-08-22 MED ORDER — SULFAMETHOXAZOLE-TRIMETHOPRIM 800-160 MG OR TABS
1.0000 | ORAL_TABLET | Freq: Two times a day (BID) | ORAL | 0 refills | Status: AC
Start: 2020-08-22 — End: 2020-09-05

## 2020-08-22 MED ORDER — ONDANSETRON 4 MG OR TBDP
4.0000 mg | ORAL_TABLET | Freq: Two times a day (BID) | ORAL | 0 refills | Status: AC | PRN
Start: 2020-08-22 — End: ?

## 2020-08-22 NOTE — Progress Notes (Signed)
Destiny Osborne is a 75 year old here for the following:    Complains of 3 days of worsening burning and pain with urination  Now frequency and cloudy urine   Some blood and foul smell.  No flank pain or fever   Associated nausea  No vaginitis or concern for sti     Has had recurrent UTI in the last few years following pelvic floor surgery     PMH:  HTN, CAD   Surgery: pelvic floor mesh/sling for prolapse  Social: no smoking     Vitals:    08/22/20 0952   BP: 128/74   Pulse: 74   Resp: 16   Temp: 37.3 C   SpO2: 98%     PHYSICAL EXAM:  General: alert, no distress  Skin: normal turgor   Eyes: Lids/periorbital skin normal, Conjunctivae/corneas clear  Back: no CVAT     A/P:      (N39.0) Complicated UTI (urinary tract infection)  Plan: sulfamethoxazole-trimethoprim (Bactrim DS)         800-160 MG tablet, Culture Urine, Bact, Culture        Urine, Bact    complicated by age and recurrence- will culture         Start bactrim per patient request that this is her preferred and best tolerated. Some risk for resistance but culture is pending   In past has needed longer courses sometimes up to 2 weeks   risks and side effects of medication reviewed  Advised patient on signs and symptoms that should prompt urgent or emergent recheck    (R11.0) Nausea  Plan: ondansetron 4 MG disintegrating tablet          risks and side effects of medication reviewed  Push fluids

## 2020-08-22 NOTE — Patient Instructions (Signed)
Patient Education     Bladder Infection,Female (Adult)    Urine normally doesn't have any germs (bacteria) in it. But bacteria can get into the urinary tract from the skin around the rectum. Or they can travel in the blood from other parts of the body. Once they are in your urinary tract, they can cause infection in these areas:   The urethra (urethritis)   The bladder (cystitis)   The kidneys (pyelonephritis)  The most common place for an infection is in the bladder. This is called a bladder infection. This is one of the most common infections in women. Most bladder infections are easily treated. They are not serious unless the infection spreads to the kidney.  The terms bladder infection, UTI, and cystitis are often used to describe the same thing. But they are not always the same. Cystitis is an inflammation of the bladder. Themost common cause of cystitis is an infection.  Symptoms  The infection causes inflammation in the urethra and bladder. This causes many of the symptoms. The most common symptoms of a bladder infection are:   Pain or burning when urinating   Having to urinate more often than normal   Urgent need to urinate   Only a small amount of urine comes out   Blood in urine   Belly (abdominal) discomfort. This is often in the lower belly above the pubic bone.   Cloudy urine   Strong- or bad-smelling urine   Unable to urinate (urinary retention)   Unable to hold urine in (urinary incontinence)   Fever   Loss of appetite   Confusion (in older adults)  Causes  Bladder infections are not contagious. You can't get one from someone else, from a toilet seat, or from sharing a bath.  The most common cause of bladder infections is bacteria from the bowels. The bacteria get onto the skin around the opening of the urethra. From there, they can get into the urine. Then they travel up to the bladder, causing inflammation and infection. This often happens because of:   Wiping incorrectly after  urinating. Always wipe from front to back.   Bowel incontinence   Pregnancy   Procedures such as having a catheter put in   Older age   Not emptying your bladder. This can give bacteria a chance to grow in your urine.   Fluid loss (dehydration)   Constipation   Having sex   Using a diaphragm for birth control  Treatment  Bladder infections are diagnosed by a urine test and urine culture. They are treated with antibiotics. They oftenclear up quickly without problems. Treatment helps prevent a more serious kidney infection.  Medicines  Medicines can help in the treatment of a bladder infection:   Take antibiotics until they are used up, even if you feel better. It's important to finish them to make sure the infection has cleared.   You can use acetaminophen or ibuprofen for pain, fever, or discomfort, unless another medicine was prescribed. If you have long-term (chronic) liver or kidney disease, talk with your healthcareprovider before usingthese medicines. Also talk with your provider if you've ever had a stomach ulcer or GI (gastrointestinal) bleeding, or are taking blood-thinner medicines.   If you are givenphenazopydridine to reduce burning with urination, it will make your urine a bright orange color. This can stain clothing.  Care and prevention  These self-care steps can help prevent future infections:   Drink plenty of fluids. This helps to prevent dehydration and   flush out your bladder. Do thisunless you must restrict fluids for other health reasons, or your healthcare provider told you not to.   Clean yourself correctly after going to the bathroom. Wipe from front to back after using the toilet. This helps prevent the spread of bacteria.   Urinate more often. Don't try to hold urine in for a long time.   Wear loose-fitting clothes and cotton underwear. Don't wear tight-fitting pants.   Improve your diet and prevent constipation. Eat more fresh fruits and vegetables, andfiber. Eat  less junk foods and fatty foods.   Don't have sex until your symptoms are gone.   Don't have caffeine, alcohol, and spicy foods. These can irritate your bladder.   Urinate right after you have sex to flush out your bladder.   If you use birth control pills and have frequent bladder infections, discuss it with your healthcare provider.  Follow-up care  Call your healthcare provider if all symptoms are not gone after 3 days of treatment. This is especially important if you have repeat infections.  If a culture was done, you will be told if your treatment needs to be changed. If directed, you can callto find out the results.  If X-rays were done, you will be told if the results will affect yourtreatment.  Call 911  Call 911 if any of the following occur:   Trouble breathing   Hard to wake up orconfusion   Fainting (loss of consciousness)   Fast heart rate  When to get medical advice  Call your healthcare provider right away if any of these occur:   Fever of 100.4F (38.0C) or higher, or as directed by your healthcare provider   Symptoms are not betterafter 3 days of treatment   Back or belly pain that gets worse   Repeated vomiting, or unable to keep medicine down   Weakness or dizziness   Vaginal discharge   Pain, redness, or swelling in the outer vaginal area (labia)  [employer] last reviewed this educational content on 06/07/2018   2000-2020 [employer] [address], [place] [address]. All rights reserved. This information is not intended as a substitute for professional medical care. Always follow your healthcare professional's instructions.

## 2020-08-24 ENCOUNTER — Telehealth (INDEPENDENT_AMBULATORY_CARE_PROVIDER_SITE_OTHER): Payer: Self-pay

## 2020-08-24 ENCOUNTER — Other Ambulatory Visit (INDEPENDENT_AMBULATORY_CARE_PROVIDER_SITE_OTHER): Payer: Self-pay | Admitting: Family Medicine

## 2020-08-24 DIAGNOSIS — N390 Urinary tract infection, site not specified: Secondary | ICD-10-CM

## 2020-08-24 LAB — URINE C/S: Culture: >100000 — AB

## 2020-08-24 MED ORDER — CIPROFLOXACIN HCL 500 MG OR TABS
500.0000 mg | ORAL_TABLET | Freq: Two times a day (BID) | ORAL | 0 refills | Status: AC
Start: 2020-08-24 — End: 2020-08-31

## 2020-08-24 NOTE — Result Encounter Note (Signed)
Ecoli resistant to bactrim. Will change to cipro. Let patient know.

## 2020-08-24 NOTE — Telephone Encounter (Signed)
-----   Message from Gillis Ends, MD sent at 08/24/2020  2:27 PM PST -----  Ecoli resistant to bactrim. Will change to cipro. Let patient know.

## 2020-08-24 NOTE — Telephone Encounter (Signed)
Pt informed, verbalized understanding.     Closing.

## 2020-09-02 DIAGNOSIS — J3089 Other allergic rhinitis: Secondary | ICD-10-CM | POA: Diagnosis not present

## 2020-09-02 DIAGNOSIS — J301 Allergic rhinitis due to pollen: Secondary | ICD-10-CM | POA: Diagnosis not present

## 2020-09-02 DIAGNOSIS — J4520 Mild intermittent asthma, uncomplicated: Secondary | ICD-10-CM | POA: Diagnosis not present

## 2020-09-02 DIAGNOSIS — J3081 Allergic rhinitis due to animal (cat) (dog) hair and dander: Secondary | ICD-10-CM | POA: Diagnosis not present

## 2020-09-15 ENCOUNTER — Other Ambulatory Visit: Payer: Self-pay | Admitting: Family

## 2020-09-15 ENCOUNTER — Other Ambulatory Visit: Payer: Self-pay | Admitting: Family Medicine

## 2020-09-15 DIAGNOSIS — F418 Other specified anxiety disorders: Secondary | ICD-10-CM

## 2020-09-21 DIAGNOSIS — N202 Calculus of kidney with calculus of ureter: Secondary | ICD-10-CM | POA: Diagnosis not present

## 2020-10-01 DIAGNOSIS — N6489 Other specified disorders of breast: Secondary | ICD-10-CM | POA: Diagnosis not present

## 2020-10-01 DIAGNOSIS — D0592 Unspecified type of carcinoma in situ of left breast: Secondary | ICD-10-CM | POA: Diagnosis not present

## 2020-10-03 ENCOUNTER — Other Ambulatory Visit: Payer: Self-pay | Admitting: Family

## 2020-10-04 ENCOUNTER — Telehealth: Payer: Self-pay | Admitting: Family Medicine

## 2020-10-04 NOTE — Telephone Encounter (Addendum)
Sent email to [place]  Patient: Brenda Delgado DOB [birth year] DOS 08/19/2020  Pt husband called stating the patient saw Dr. Letta Median instead of Dr. Ethelene Hal as he is on leave of absence (acute visit). [employer] rep, Neoma Laming, advised the patient's husband and myself that Dr. Bryan Lemma, Dr. Gena Fray, and Wilfred Lacy NP are not on his "on call provider list" for [facility] and therefore the patient was billed a copay of $25.   (1) Are we able to deduct the $25 copay for Dr. Letta Median that patient will be billed for having seen a different provider within the same office? (2) Who would be responsible for contacting [employer]BS [facility] to update the oncall provider list?

## 2020-10-11 NOTE — Telephone Encounter (Signed)
Another email sent to enrollment team, no response yet from charge correction team  From: Inocente Salles [email or web address]>  Sent: Monday, October 11, 2020 1:57 PM To: Orlene [email or web address]>; Falish, [email or web address]> Subject: RE: MRN [id number]   I called multiple phone numbers for [employer] and endured multiple transfers each time. No one would provide me the call ref number as the call was initiated by the patient & her spouse. I called [name], her husband, and he was kind enough to call [place]. He also had to hold/transfer. He spoke today with [place] and was advised the call ref # from 10/04/2020 with agent Neoma Laming was [id number].  I hope this is what is needed. Please let me know the outcome as [name], pts husband, is very anxious about the $25 charge.  Thank you, Drue Dun

## 2020-10-18 NOTE — Telephone Encounter (Signed)
Per email from [facility]... I reviewed the account and the 25.00 that the patient is referring to in the email is not being billed to the patient.  It currently shows pending with insurance.   Per email from [employer]... I called [place] Management [phone], opt 6, [address] [place], ref # [id number]) who confirmed there is nothing about an on call provider list that is managed by Network Management or any other area of [place]. He confirmed that both Dr. Ethelene Hal and Dr. Bryan Lemma are linked with [employer] with [place]. At this point, I think someone needs to call from our end on the claim to get details from the claims department at [facility] to shed some light.  I notified pt spouse of progress above.

## 2020-10-18 NOTE — Progress Notes (Signed)
Subjective:   Brenda Delgado is a 75 y.o. female who presents for Medicare Annual (Subsequent) preventive examination.  I connected with Josi today by telephone and verified that I am speaking with the correct person using two identifiers. Location patient: home Location provider: work Persons participating in the virtual visit: patient, Marine scientist.    I discussed the limitations, risks, security and privacy concerns of performing an evaluation and management service by telephone and the availability of in person appointments. I also discussed with the patient that there may be a patient responsible charge related to this service. The patient expressed understanding and verbally consented to this telephonic visit.    Interactive audio and video telecommunications were attempted between this provider and patient, however failed, due to patient having technical difficulties OR patient did not have access to video capability.  We continued and completed visit with audio only.  Some vital signs may be absent or patient reported.   Time Spent with patient on telephone encounter: 25 minutes   Review of Systems     Cardiac Risk Factors include: advanced age (>74men, >55 women);dyslipidemia     Objective:    Today's Vitals   10/19/20 0946  Weight: 116 lb (52.6 kg)  Height: 4\' 11"  (1.499 m)  PainSc: 5    Body mass index is 23.43 kg/m.  Advanced Directives 10/19/2020 02/04/2020 01/12/2020 11/07/2019 09/02/2019 08/12/2019 08/05/2019  Does Patient Have a Medical Advance Directive? Yes Yes No No Yes Yes No  Type of Paramedic of [place];Living will [place];Living will - - [place];Living will [place];Living will -  Does patient want to make changes to medical advance directive? - - - - - - -  Copy of [place] in Chart? No - copy requested - - - No - copy requested No - copy requested -  Would  patient like information on creating a medical advance directive? - - No - Patient declined No - Patient declined No - Patient declined - No - Patient declined  Pre-existing out of facility DNR order (yellow form or pink MOST form) - - - - - - -    Current Medications (verified) Outpatient Encounter Medications as of 10/19/2020  Medication Sig  . ALBUTEROL IN Inhale into the lungs as needed.  . Ascorbic Acid (VITAMIN C) 1000 MG tablet Take 1,000 mg by mouth daily.  Marland Kitchen aspirin EC 81 MG tablet Take 81 mg by mouth daily.  . Calcium-Vitamin D-Vitamin K 500-500-40 MG-UNT-MCG CHEW Chew 2 tablets by mouth daily.  . Cholecalciferol (VITAMIN D3) 50 MCG (2000 UT) TABS Take 50 mcg by mouth 2 (two) times daily.  . Coenzyme Q10 (COQ10 PO) Take 1 capsule by mouth daily.  Marland Kitchen EPINEPHrine 0.3 mg/0.3 mL IJ SOAJ injection SMARTSIG:1 Pre-Filled Pen Syringe IM As Needed  . pantoprazole (PROTONIX) 40 MG tablet TAKE 1 TABLET BY MOUTH AT BEDTIME  . PARoxetine (PAXIL) 20 MG tablet Take 1 tablet by mouth once daily  . tamoxifen (NOLVADEX) 10 MG tablet Take 1 tablet (10 mg total) by mouth daily.  Marland Kitchen zinc gluconate 50 MG tablet Take 50 mg by mouth daily.  . mupirocin ointment (BACTROBAN) 2 % Apply 1 application topically 2 (two) times daily. To right nare. (Patient not taking: No sig reported)  . [DISCONTINUED] azithromycin (ZITHROMAX) 250 MG tablet 2 tabs po x 1 day then 1 tab po daily   No facility-administered encounter medications on file as  of 10/19/2020.    Allergies (verified) Pravastatin, Erythromycin, Iohexol, Amoxicillin-pot clavulanate, Codeine, Moxifloxacin, and Sulfamethoxazole   History: Past Medical History:  Diagnosis Date  . Allergy   . Anxiety   . Arthritis    right knee  . Cancer (Home) 07/2019   left breast DCIS  . Complication of anesthesia    per pt with surgery 05-14-2019 right facial swelling and lip/ mouth blister's  . Constipation   . Depression   . Environmental and seasonal  allergies   . Family history of colon cancer   . Family history of ovarian cancer   . GERD (gastroesophageal reflux disease)   . Hiatal hernia   . History of kidney stones   . Left ureteral stone   . Mild asthma   . Nephrolithiasis   . Nocturia   . PONV (postoperative nausea and vomiting)   . Pre-diabetes   . Renal calculus, left   . Wears glasses   . Wears hearing aid in both ears    Past Surgical History:  Procedure Laterality Date  . ABDOMINAL HYSTERECTOMY    . ANTERIOR CERVICAL DECOMP/DISCECTOMY FUSION  01-06-2010   @[facility]    C4--5  . BREAST LUMPECTOMY WITH RADIOACTIVE SEED LOCALIZATION Left 08/12/2019   Procedure: LEFT BREAST LUMPECTOMY WITH RADIOACTIVE SEED LOCALIZATION;  Surgeon: Alphonsa Overall, MD;  Location: [facility];  Service: General;  Laterality: Left;  . CARPAL TUNNEL RELEASE Right 2000  . cataracts  2018   bilateral  . CYSTOCELE REPAIR  09/11/2011   Procedure: ANTERIOR REPAIR (CYSTOCELE);  Surgeon: Olga Millers, MD;  Location: [facility];  Service: Gynecology;  Laterality: N/A;  . CYSTOSCOPY  09/11/2011   Procedure: CYSTOSCOPY;  Surgeon: Olga Millers, MD;  Location: [facility];  Service: Gynecology;  Laterality: N/A;  . CYSTOSCOPY WITH RETROGRADE PYELOGRAM, URETEROSCOPY AND STENT PLACEMENT Left 05/28/2019   Procedure: CYSTOSCOPY WITH RETROGRADE PYELOGRAM, URETEROSCOPY AND STENT REPLACEMENT;  Surgeon: Alexis Frock, MD;  Location: [facility];  Service: Urology;  Laterality: Left;  . CYSTOSCOPY WITH STENT PLACEMENT Left 05/14/2019   Procedure: CYSTOSCOPY WITH STENT PLACEMENT, RETROGRADE;  Surgeon: Bjorn Loser, MD;  Location: [facility];  Service: Urology;  Laterality: Left;  . ELBOW SURGERY Right   . EXCISION VULVA CYST Bilateral 01-16-2005  @[facility]   . HOLMIUM LASER APPLICATION Left 62/22/9798   Procedure: HOLMIUM LASER APPLICATION;  Surgeon: Alexis Frock, MD;  Location: [facility];  Service: Urology;  Laterality: Left;  . KNEE  ARTHROSCOPY Right 2005;    02-11-2013  . KNEE SURGERY Right 08-20-2012   @[facility]    peroneal nerve neurolysis proximal tibia/ fibular articular branch   . LAPAROSCOPIC CHOLECYSTECTOMY  1990s  . SACROSPINOUS LIGAMENT FIXATION  04-05-2017   @[facility]    UPHOLD vaginal mesh and  Anterior Repair  . SALPINGOOPHORECTOMY  09/11/2011   Procedure: SALPINGO OOPHERECTOMY;  Surgeon: Olga Millers, MD;  Location: [facility];  Service: Gynecology;  Laterality: Bilateral;  . SHOULDER SURGERY Right   . TONSILLECTOMY    . VAGINAL HYSTERECTOMY  09/11/2011   Procedure: HYSTERECTOMY VAGINAL;  Surgeon: Olga Millers, MD;  Location: [facility];  Service: Gynecology;  Laterality: N/A;  . [place] Dana Milagros Loll BIOPSY  09/11/2011   Procedure: VULVAR BIOPSY;  Surgeon: Olga Millers, MD;  Location: [facility];  Service: Gynecology;  Laterality: Left;  sebaceous cyst excision left vula   Family History  Problem Relation Age of Onset  . Skin cancer Mother   . Colon cancer Paternal Uncle  dx late 50s-60s  . Ovarian cancer Paternal Aunt 59  . Cancer Cousin        unk type   Social History   Socioeconomic History  . Marital status: Married    Spouse name: Not on file  . Number of children: Not on file  . Years of education: Not on file  . Highest education level: Not on file  Occupational History  . Not on file  Tobacco Use  . Smoking status: Never Smoker  . Smokeless tobacco: Never Used  Vaping Use  . Vaping Use: Never used  Substance and Sexual Activity  . Alcohol use: Yes    Comment: occasional  . Drug use: Never  . Sexual activity: Not on file  Other Topics Concern  . Not on file  Social History Narrative   Right handed   Social Determinants of Health   Financial Resource Strain: Low Risk   . Difficulty of Paying Living Expenses: Not hard at all  Food Insecurity: No Food Insecurity  . Worried About Charity fundraiser in the Last Year: Never true  . Ran Out of Food in the Last Year: Never true   Transportation Needs: No Transportation Needs  . Lack of Transportation (Medical): No  . Lack of Transportation (Non-Medical): No  Physical Activity: Sufficiently Active  . Days of Exercise per Week: 5 days  . Minutes of Exercise per Session: 30 min  Stress: No Stress Concern Present  . Feeling of Stress : Not at all  Social Connections: Socially Integrated  . Frequency of Communication with Friends and Family: More than three times a week  . Frequency of Social Gatherings with Friends and Family: More than three times a week  . Attends Religious Services: 1 to 4 times per year  . Active Member of Clubs or Organizations: Yes  . Attends Archivist Meetings: 1 to 4 times per year  . Marital Status: Married    Tobacco Counseling Counseling given: Not Answered   Clinical Intake:  Pre-visit preparation completed: Yes  Pain : 0-10 Pain Score: 5  Pain Location: Neck (and left shoulder) Pain Onset: More than a month ago Pain Frequency: Constant     Nutritional Status: BMI of 19-24  Normal Nutritional Risks: None Diabetes: No  How often do you need to have someone help you when you read instructions, pamphlets, or other written materials from your doctor or pharmacy?: 1 - Never  Diabetic?No  Interpreter Needed?: No  Information entered by :: Caroleen Hamman LPN   Activities of Daily Living In your present state of health, do you have any difficulty performing the following activities: 10/19/2020  Hearing? Y  Vision? N  Difficulty concentrating or making decisions? N  Walking or climbing stairs? N  Dressing or bathing? N  Doing errands, shopping? N  Preparing Food and eating ? N  Using the Toilet? N  In the past six months, have you accidently leaked urine? N  Do you have problems with loss of bowel control? N  Managing your Medications? N  Managing your Finances? N  Housekeeping or managing your Housekeeping? N  Some recent data might be hidden     Patient Care Team: Libby Maw, MD as PCP - General (Family Medicine) Mauro Kaufmann, RN as Oncology Nurse Navigator Rockwell Germany, RN as Oncology Nurse Navigator Alphonsa Overall, MD as Consulting Physician (General Surgery) Nicholas Lose, MD as Consulting Physician (Hematology and Oncology) Eppie Gibson, MD as Attending Physician (  Radiation Oncology)  Indicate any recent Medical Services you may have received from other than [facility] providers in the past year (date may be approximate).     Assessment:   This is a routine wellness examination for Shanera.  Hearing/Vision screen  Hearing Screening   125Hz  250Hz  500Hz  1000Hz  2000Hz  3000Hz  4000Hz  6000Hz  8000Hz   Right ear:           Left ear:           Comments: Patient states she has some hearing loss  Vision Screening Comments: Wears glasses Last eye exam-2021-Dr.Digby  Dietary issues and exercise activities discussed: Current Exercise Habits: Home exercise routine, Type of exercise: walking, Time (Minutes): 25, Frequency (Times/Week): 5, Weekly Exercise (Minutes/Week): 125, Intensity: Mild, Exercise limited by: None identified  Goals    . Patient Stated     Drink more water & eat healthier      Depression Screen PHQ 2/9 Scores 10/19/2020 07/22/2019  PHQ - 2 Score 0 0    Fall Risk Fall Risk  10/19/2020 02/04/2020 07/22/2019  Falls in the past year? 0 0 0  Number falls in past yr: 0 0 -  Injury with Fall? 0 0 -  Follow up Falls prevention discussed - -    FALL RISK PREVENTION PERTAINING TO THE HOME:  Any stairs in or around the home? Yes  If so, are there any without handrails? No  Home free of loose throw rugs in walkways, pet beds, electrical cords, etc? Yes  Adequate lighting in your home to reduce risk of falls? Yes   ASSISTIVE DEVICES UTILIZED TO PREVENT FALLS:  Life alert? No  Use of a cane, walker or w/c? No  Grab bars in the bathroom? No  Shower chair or bench in shower? No  Elevated toilet  seat or a handicapped toilet? No   TIMED UP AND GO:  Was the test performed? No . Phone visit   Cognitive Function:Normal cognitive status assessed by this Nurse Health Advisor. No abnormalities found.     Montreal Cognitive Assessment  04/02/2020  Visuospatial/ Executive (0/5) 3  Naming (0/3) 3  Attention: Read list of digits (0/2) 2  Attention: Read list of letters (0/1) 1  Attention: Serial 7 subtraction starting at 100 (0/3) 3  Language: Repeat phrase (0/2) 0  Language : Fluency (0/1) 1  Abstraction (0/2) 2  Delayed Recall (0/5) 2  Orientation (0/6) 6  Total 23  Adjusted Score (based on education) 23      Immunizations Immunization History  Administered Date(s) Administered  . Influenza-Unspecified 05/19/2020  . PFIZER(Purple Top)SARS-COV-2 Vaccination 08/19/2019, 09/17/2019, 06/15/2020  . Pneumococcal Conjugate-13 06/25/2015  . Pneumococcal Polysaccharide-23 09/12/2012  . Tdap 02/17/2008    TDAP status: Due, Education has been provided regarding the importance of this vaccine. Advised may receive this vaccine at local pharmacy or Health Dept. Aware to provide a copy of the vaccination record if obtained from local pharmacy or Health Dept. Verbalized acceptance and understanding.  Flu Vaccine status: Up to date  Pneumococcal vaccine status: Up to date  Covid-19 vaccine status: Completed vaccines  Qualifies for Shingles Vaccine? Yes   Zostavax completed No   Shingrix Completed?: No.    Education has been provided regarding the importance of this vaccine. Patient has been advised to call insurance company to determine out of pocket expense if they have not yet received this vaccine. Advised may also receive vaccine at local pharmacy or Health Dept. Verbalized acceptance and understanding.  Screening Tests Health  Maintenance  Topic Date Due  . Hepatitis C Screening  Never done  . COLONOSCOPY (Pts 45-67yrs Insurance coverage will need to be confirmed)  Never done   . DEXA SCAN  Never done  . TETANUS/TDAP  02/16/2018  . COVID-19 Vaccine (4 - Booster for Pfizer series) 03/02/2021  . MAMMOGRAM  07/06/2022  . INFLUENZA VACCINE  Completed  . PNA vac Low Risk Adult  Completed  . HPV VACCINES  Aged Out    Health Maintenance  Health Maintenance Due  Topic Date Due  . Hepatitis C Screening  Never done  . COLONOSCOPY (Pts 45-24yrs Insurance coverage will need to be confirmed)  Never done  . DEXA SCAN  Never done  . TETANUS/TDAP  02/16/2018   Colorectal cancer screening: Patient states she had colonoscopy 2-3 years ago. Unsure of exact date or where.   Mammogram status: Completed Bilateral 07/06/2020. Repeat every year  Bone density status: Patient states ordered by GYN. She will have results sent to PCP.  Lung Cancer Screening: (Low Dose CT Chest recommended if Age 66-80 years, 30 pack-year currently smoking OR have quit w/in 15years.) does not qualify.    Additional Screening:  Hepatitis C Screening: does qualify; Discuss with PCP  Vision Screening: Recommended annual ophthalmology exams for early detection of glaucoma and other disorders of the eye. Is the patient up to date with their annual eye exam?  Yes  Who is the provider or what is the name of the office in which the patient attends annual eye exams? Dr. Bing Plume   Dental Screening: Recommended annual dental exams for proper oral hygiene  Community Resource Referral / Chronic Care Management: CRR required this visit?  No   CCM required this visit?  No      Plan:     I have personally reviewed and noted the following in the patient's chart:   . Medical and social history . Use of alcohol, tobacco or illicit drugs  . Current medications and supplements . Functional ability and status . Nutritional status . Physical activity . Advanced directives . List of other physicians . Hospitalizations, surgeries, and ER visits in previous 12 months . Vitals . Screenings to include  cognitive, depression, and falls . Referrals and appointments  In addition, I have reviewed and discussed with patient certain preventive protocols, quality metrics, and best practice recommendations. A written personalized care plan for preventive services as well as general preventive health recommendations were provided to patient.   Due to this being a telephonic visit, the after visit summary with patients personalized plan was offered to patient via mail or my-chart.  Patient would like to access on my-chart.   Marta Antu, LPN   10/26/252  Nurse Health Advisor  Nurse Notes: Patient c/o neck pain.Appt made with PCP.

## 2020-10-19 ENCOUNTER — Ambulatory Visit (INDEPENDENT_AMBULATORY_CARE_PROVIDER_SITE_OTHER): Payer: Medicare Other

## 2020-10-19 VITALS — Ht 59.0 in | Wt 116.0 lb

## 2020-10-19 DIAGNOSIS — Z0000 Encounter for general adult medical examination without abnormal findings: Secondary | ICD-10-CM | POA: Diagnosis not present

## 2020-10-19 NOTE — Patient Instructions (Signed)
[name] , Thank you for taking time to complete your Medicare Wellness Visit. I appreciate your ongoing commitment to your health goals. Please review the following plan we discussed and let me know if I can assist you in the future.   Screening recommendations/referrals: Colonoscopy: Per our conversation, completed 2 years ago. Please have a copy of results sent to PCP. Mammogram: Completed 07/06/2020-Due 07/06/2021 Bone Density: Per our conversation, completed at GYN office.Please have a copy of results sent to PCP. Recommended yearly ophthalmology/optometry visit for glaucoma screening and checkup Recommended yearly dental visit for hygiene and checkup  Vaccinations: Influenza vaccine: Up to date Pneumococcal vaccine: Completed vacines Tdap vaccine: Discuss with pharmacy Shingles vaccine: Discuss with pharmacy   Covid-19:Completed vaccines  Advanced directives: Please bring a copy for your chart  Conditions/risks identified: See problem list  Next appointment: Follow up in one year for your annual wellness visit    Preventive Care 65 Years and Older, Female Preventive care refers to lifestyle choices and visits with your health care provider that can promote health and wellness. What does preventive care include?  A yearly physical exam. This is also called an annual well check.  Dental exams once or twice a year.  Routine eye exams. Ask your health care provider how often you should have your eyes checked.  Personal lifestyle choices, including:  Daily care of your teeth and gums.  Regular physical activity.  Eating a healthy diet.  Avoiding tobacco and drug use.  Limiting alcohol use.  Practicing safe sex.  Taking low-dose aspirin every day.  Taking vitamin and mineral supplements as recommended by your health care provider. What happens during an annual well check? The services and screenings done by your health care provider during your annual well check  will depend on your age, overall health, lifestyle risk factors, and family history of disease. Counseling  Your health care provider may ask you questions about your:  Alcohol use.  Tobacco use.  Drug use.  Emotional well-being.  Home and relationship well-being.  Sexual activity.  Eating habits.  History of falls.  Memory and ability to understand (cognition).  Work and work Statistician.  Reproductive health. Screening  You may have the following tests or measurements:  Height, weight, and BMI.  Blood pressure.  Lipid and cholesterol levels. These may be checked every 5 years, or more frequently if you are over 50 years old.  Skin check.  Lung cancer screening. You may have this screening every year starting at age 27 if you have a 30-pack-year history of smoking and currently smoke or have quit within the past 15 years.  Fecal occult blood test (FOBT) of the stool. You may have this test every year starting at age 41.  Flexible sigmoidoscopy or colonoscopy. You may have a sigmoidoscopy every 5 years or a colonoscopy every 10 years starting at age 39.  Hepatitis C blood test.  Hepatitis B blood test.  Sexually transmitted disease (STD) testing.  Diabetes screening. This is done by checking your blood sugar (glucose) after you have not eaten for a while (fasting). You may have this done every 1-3 years.  Bone density scan. This is done to screen for osteoporosis. You may have this done starting at age 40.  Mammogram. This may be done every 1-2 years. Talk to your health care provider about how often you should have regular mammograms. Talk with your health care provider about your test results, treatment options, and if necessary, the need for  more tests. Vaccines  Your health care provider may recommend certain vaccines, such as:  Influenza vaccine. This is recommended every year.  Tetanus, diphtheria, and acellular pertussis (Tdap, Td) vaccine. You may  need a Td booster every 10 years.  Zoster vaccine. You may need this after age 58.  Pneumococcal 13-valent conjugate (PCV13) vaccine. One dose is recommended after age 43.  Pneumococcal polysaccharide (PPSV23) vaccine. One dose is recommended after age 45. Talk to your health care provider about which screenings and vaccines you need and how often you need them. This information is not intended to replace advice given to you by your health care provider. Make sure you discuss any questions you have with your health care provider. Document Released: 08/20/2015 Document Revised: 04/12/2016 Document Reviewed: 05/25/2015 Elsevier Interactive Patient Education  2017 [place] Prevention in the Home Falls can cause injuries. They can happen to people of all ages. There are many things you can do to make your home safe and to help prevent falls. What can I do on the outside of my home?  Regularly fix the edges of walkways and driveways and fix any cracks.  Remove anything that might make you trip as you walk through a door, such as a raised step or threshold.  Trim any bushes or trees on the path to your home.  Use bright outdoor lighting.  Clear any walking paths of anything that might make someone trip, such as rocks or tools.  Regularly check to see if handrails are loose or broken. Make sure that both sides of any steps have handrails.  Any raised decks and porches should have guardrails on the edges.  Have any leaves, snow, or ice cleared regularly.  Use sand or salt on walking paths during winter.  Clean up any spills in your garage right away. This includes oil or grease spills. What can I do in the bathroom?  Use night lights.  Install grab bars by the toilet and in the tub and shower. Do not use towel bars as grab bars.  Use non-skid mats or decals in the tub or shower.  If you need to sit down in the shower, use a plastic, non-slip stool.  Keep the floor  dry. Clean up any water that spills on the floor as soon as it happens.  Remove soap buildup in the tub or shower regularly.  Attach bath mats securely with double-sided non-slip rug tape.  Do not have throw rugs and other things on the floor that can make you trip. What can I do in the bedroom?  Use night lights.  Make sure that you have a light by your bed that is easy to reach.  Do not use any sheets or blankets that are too big for your bed. They should not hang down onto the floor.  Have a firm chair that has side arms. You can use this for support while you get dressed.  Do not have throw rugs and other things on the floor that can make you trip. What can I do in the kitchen?  Clean up any spills right away.  Avoid walking on wet floors.  Keep items that you use a lot in easy-to-reach places.  If you need to reach something above you, use a strong step stool that has a grab bar.  Keep electrical cords out of the way.  Do not use floor polish or wax that makes floors slippery. If you must use wax, use non-skid  floor wax.  Do not have throw rugs and other things on the floor that can make you trip. What can I do with my stairs?  Do not leave any items on the stairs.  Make sure that there are handrails on both sides of the stairs and use them. Fix handrails that are broken or loose. Make sure that handrails are as long as the stairways.  Check any carpeting to make sure that it is firmly attached to the stairs. Fix any carpet that is loose or worn.  Avoid having throw rugs at the top or bottom of the stairs. If you do have throw rugs, attach them to the floor with carpet tape.  Make sure that you have a light switch at the top of the stairs and the bottom of the stairs. If you do not have them, ask someone to add them for you. What else can I do to help prevent falls?  Wear shoes that:  Do not have high heels.  Have rubber bottoms.  Are comfortable and fit you  well.  Are closed at the toe. Do not wear sandals.  If you use a stepladder:  Make sure that it is fully opened. Do not climb a closed stepladder.  Make sure that both sides of the stepladder are locked into place.  Ask someone to hold it for you, if possible.  Clearly mark and make sure that you can see:  Any grab bars or handrails.  First and last steps.  Where the edge of each step is.  Use tools that help you move around (mobility aids) if they are needed. These include:  Canes.  Walkers.  Scooters.  Crutches.  Turn on the lights when you go into a dark area. Replace any light bulbs as soon as they burn out.  Set up your furniture so you have a clear path. Avoid moving your furniture around.  If any of your floors are uneven, fix them.  If there are any pets around you, be aware of where they are.  Review your medicines with your doctor. Some medicines can make you feel dizzy. This can increase your chance of falling. Ask your doctor what other things that you can do to help prevent falls. This information is not intended to replace advice given to you by your health care provider. Make sure you discuss any questions you have with your health care provider. Document Released: 05/20/2009 Document Revised: 12/30/2015 Document Reviewed: 08/28/2014 Elsevier Interactive Patient Education  2017 [employer].

## 2020-10-22 ENCOUNTER — Ambulatory Visit: Payer: Self-pay | Admitting: Surgery

## 2020-10-22 DIAGNOSIS — N6489 Other specified disorders of breast: Secondary | ICD-10-CM | POA: Diagnosis not present

## 2020-10-22 DIAGNOSIS — D0592 Unspecified type of carcinoma in situ of left breast: Secondary | ICD-10-CM | POA: Diagnosis not present

## 2020-10-22 NOTE — H&P (Signed)
Brenda Delgado Appointment: 10/22/2020 11:50 AM Location: [facility] Patient #: [id number] DOB: [birth year] Married / Language: Brenda Delgado / Race: White Female  History of Present Illness Brenda Moores A. Chukwuemeka Artola MD; 10/22/2020 12:13 PM) Patient words: Patient returns for follow-up after left breast seroma. History of left breast lumpectomy in 2021 by Dr. [name] for DCIS. She is any issues with left breast seroma since surgery. She's had 4 previous aspirations with declining amounts. She has significant cosmetic deformity of the area due to this as well as history of some drainage, pain and redness that has been managed medically. Today she returns for follow-up to discuss any other options for intervention necessary. She has some pain in the area and redness from time to time the swelling that seems to get better with aspiration and oral antibiotics.  The patient is a 75 year old female.   Allergies Brenda Forehand, CNA; 10/22/2020 11:52 AM) Pravastatin Sodium *ANTIHYPERLIPIDEMICS* Myalgias Erythromycin *MACROLIDES* Cramps Amoxicillin-Pot Clavulanate *CHEMICALS* GI upset Codeine Phosphate *ANALGESICS - OPIOID* Nausea, Vomiting. Moxifloxacin HCl *CHEMICALS* Itching. Sulfa Antibiotics Itching, Rash. Iodinated Contrast Media Hives, Itching. Iohexol *DIAGNOSTIC PRODUCTS* Avelox *FLUOROQUINOLONES* Allergies Reconciled  Medication History Brenda Forehand, CNA; 10/22/2020 11:52 AM) Tamoxifen Citrate (10MG  Tablet, Oral) Active. Albuterol (90MCG/ACT Aerosol Soln, Inhalation) Active. Aspirin (81MG  Tablet DR, Oral) Active. Calcium-Vitamin D-Vitamin K (500-500-40MG -UNT-MCG Tablet Chewable, Oral) Active. Cholecalciferol (50 MCG(2000 UT) Tablet Chewable, Oral) Active. Coenzyme Q10 (10MG  Capsule, Oral) Active. EPINEPHrine (0.3MG /0.3ML Soln Auto-inj, Injection) Active. Multiple Vitamin (1 (one) Oral) Active. Protonix (40MG  Tablet DR, Oral) Active. Paxil (20MG  Tablet, Oral)  Active. Medications Reconciled     Physical Exam (Brenda Delgado A. Rhemi Balbach MD; 10/22/2020 12:15 PM)  General Mental Status-Alert. General Appearance-Consistent with stated age. Hydration-Well hydrated. Voice-Normal.  Head and Neck Head-normocephalic, atraumatic with no lesions or palpable masses. Trachea-midline. Thyroid Gland Characteristics - normal size and consistency.  Breast Note: Left breast incision left upper rest is noted. If significant dimpling at this point in time but no significant seroma noted today. There is significant scarring and soreness of the area. Her right breast is normal.  Neurologic Note: Tremor noted at rest.  Lymphatic Head & Neck  General Head & Neck Lymphatics: Bilateral - Description - Normal. Axillary  General Axillary Region: Bilateral - Description - Normal. Tenderness - Non Tender.    Assessment & Plan (Arabela Basaldua A. Rosaly Labarbera MD; 10/22/2020 12:16 PM)  BREAST CANCER, STAGE 0, LEFT (D05.92) Story: Oncology - [facility] Impression: Overall stable from a surgical standpoint. Significant cerumen issues and cosmetic issues the left breast. I discussed revision and excision of the lumpectomy cavity but explained this may not necessarily take care of the stromal problem and could exacerbate or call his recurrence. I recommended drain at that's donned as well as scar revision due to the cosmetic deformity to her left breast. There is risk this may recur and I discussed that with him. I discussed observational treatment this point time since she is asymptomatic and does not have any appreciable seroma to aspirate at this point time. Also, her numbers have declined with each aspiration of last year. She will think things over about revisional surgery and let me know.   RECURRENT SEROMA OF BREAST (N64.89) Impression: She is presenting to the office with concerns of pain near her lumpectomy incision times several This patient encounter took  20 minutes today to perform the following: take history, perform exam, review outside records, interpret imaging, counsel the patient on their diagnosis and document encounter, findings & plan  in the EHR.  Current Plans Pt Education - [employer] Free Text Education/Instructions: discussed with patient and provided information.

## 2020-11-09 ENCOUNTER — Other Ambulatory Visit: Payer: Self-pay

## 2020-11-10 ENCOUNTER — Ambulatory Visit (INDEPENDENT_AMBULATORY_CARE_PROVIDER_SITE_OTHER): Payer: Medicare Other | Admitting: Family Medicine

## 2020-11-10 ENCOUNTER — Encounter: Payer: Self-pay | Admitting: Family Medicine

## 2020-11-10 ENCOUNTER — Other Ambulatory Visit (HOSPITAL_COMMUNITY)
Admission: RE | Admit: 2020-11-10 | Discharge: 2020-11-10 | Disposition: A | Discharge status: Home / Self Care | Payer: Medicare Other | Source: Ambulatory Visit | Attending: Family Medicine | Admitting: Family Medicine

## 2020-11-10 VITALS — BP 115/64 | HR 76 | Temp 97.7°F | Ht 59.0 in | Wt 118.6 lb

## 2020-11-10 DIAGNOSIS — F418 Other specified anxiety disorders: Secondary | ICD-10-CM

## 2020-11-10 DIAGNOSIS — Z0000 Encounter for general adult medical examination without abnormal findings: Secondary | ICD-10-CM

## 2020-11-10 DIAGNOSIS — R350 Frequency of micturition: Secondary | ICD-10-CM | POA: Diagnosis not present

## 2020-11-10 DIAGNOSIS — Z8639 Personal history of other endocrine, nutritional and metabolic disease: Secondary | ICD-10-CM | POA: Diagnosis not present

## 2020-11-10 DIAGNOSIS — E7800 Pure hypercholesterolemia, unspecified: Secondary | ICD-10-CM | POA: Diagnosis not present

## 2020-11-10 DIAGNOSIS — M542 Cervicalgia: Secondary | ICD-10-CM

## 2020-11-10 LAB — URINALYSIS, ROUTINE W REFLEX MICROSCOPIC
Bilirubin Urine: NEGATIVE
Hgb urine dipstick: NEGATIVE
Ketones, ur: NEGATIVE
Leukocytes,Ua: NEGATIVE
Nitrite: NEGATIVE
Specific Gravity, Urine: 1.015 (ref 1.000–1.030)
Total Protein, Urine: NEGATIVE
Urine Glucose: NEGATIVE
Urobilinogen, UA: 1 (ref 0.0–1.0)
pH: 7 (ref 5.0–8.0)

## 2020-11-10 LAB — COMPREHENSIVE METABOLIC PANEL
ALT: 13 U/L (ref 0–35)
AST: 19 U/L (ref 0–37)
Albumin: 4 g/dL (ref 3.5–5.2)
Alkaline Phosphatase: 51 U/L (ref 39–117)
BUN: 15 mg/dL (ref 6–23)
CO2: 32 mEq/L (ref 19–32)
Calcium: 9.1 mg/dL (ref 8.4–10.5)
Chloride: 103 mEq/L (ref 96–112)
Creatinine, Ser: 0.64 mg/dL (ref 0.40–1.20)
GFR: 87.19 mL/min (ref >60.00)
Glucose, Bld: 94 mg/dL (ref 70–99)
Potassium: 4.4 mEq/L (ref 3.5–5.1)
Sodium: 140 mEq/L (ref 135–145)
Total Bilirubin: 0.5 mg/dL (ref 0.2–1.2)
Total Protein: 6.2 g/dL (ref 6.0–8.3)

## 2020-11-10 LAB — CBC
HCT: 43 % (ref 36.0–46.0)
Hemoglobin: 14.3 g/dL (ref 12.0–15.0)
MCHC: 33.4 g/dL (ref 30.0–36.0)
MCV: 91.7 fl (ref 78.0–100.0)
Platelets: 250 10*3/uL (ref 150.0–400.0)
RBC: 4.68 Mil/uL (ref 3.87–5.11)
RDW: 13.6 % (ref 11.5–15.5)
WBC: 2.4 10*3/uL — ABNORMAL LOW (ref 4.0–10.5)

## 2020-11-10 LAB — HEMOGLOBIN A1C: Hgb A1c MFr Bld: 5.7 % (ref 4.6–6.5)

## 2020-11-10 LAB — LDL CHOLESTEROL, DIRECT: Direct LDL: 97 mg/dL

## 2020-11-10 NOTE — Progress Notes (Signed)
Established Patient Office Visit  Subjective:  Patient ID: Brenda Delgado, female    DOB: 1946/06/17  Age: 75 y.o. MRN: [id number]  CC:  Chief Complaint  Patient presents with  . Pain    Neck pains x few months. Patient would like A1c checked. Chalky discharge x 2-3 months. How to find out her blood type.     HPI Brenda Delgado presents for follow-up of elevated LDL cholesterol, history of elevated blood sugar, depression with anxiety, neck pain, and urinary frequency.  She is experiencing urinary frequency without hydration pends to her urine.  Denies vaginal itching.  She has been seeing a chiropractor for neck pain and stiffness.  She is status post fusion 2 to 3 years ago.  Seems to be helping some.  Continues Paxil with good result for depression.  Today hemoglobin A1c's have been in the normal range.  She would like to know her blood type.  Past Medical History:  Diagnosis Date  . Allergy   . Anxiety   . Arthritis    right knee  . Cancer ([place]) 07/2019   left breast DCIS  . Complication of anesthesia    per pt with surgery 05-14-2019 right facial swelling and lip/ mouth blister's  . Constipation   . Depression   . Environmental and seasonal allergies   . Family history of colon cancer   . Family history of ovarian cancer   . GERD (gastroesophageal reflux disease)   . Hiatal hernia   . History of kidney stones   . Left ureteral stone   . Mild asthma   . Nephrolithiasis   . Nocturia   . PONV (postoperative nausea and vomiting)   . Pre-diabetes   . Renal calculus, left   . Wears glasses   . Wears hearing aid in both ears     Past Surgical History:  Procedure Laterality Date  . ABDOMINAL HYSTERECTOMY    . ANTERIOR CERVICAL DECOMP/DISCECTOMY FUSION  01-06-2010   @[facility]    C4--5  . BREAST LUMPECTOMY WITH RADIOACTIVE SEED LOCALIZATION Left 08/12/2019   Procedure: LEFT BREAST LUMPECTOMY WITH RADIOACTIVE SEED LOCALIZATION;  Surgeon: Alphonsa Overall, MD;  Location: [facility];  Service: General;  Laterality: Left;  . CARPAL TUNNEL RELEASE Right 2000  . cataracts  2018   bilateral  . CYSTOCELE REPAIR  09/11/2011   Procedure: ANTERIOR REPAIR (CYSTOCELE);  Surgeon: Olga Millers, MD;  Location: [facility];  Service: Gynecology;  Laterality: N/A;  . CYSTOSCOPY  09/11/2011   Procedure: CYSTOSCOPY;  Surgeon: Olga Millers, MD;  Location: [facility];  Service: Gynecology;  Laterality: N/A;  . CYSTOSCOPY WITH RETROGRADE PYELOGRAM, URETEROSCOPY AND STENT PLACEMENT Left 05/28/2019   Procedure: CYSTOSCOPY WITH RETROGRADE PYELOGRAM, URETEROSCOPY AND STENT REPLACEMENT;  Surgeon: Alexis Frock, MD;  Location: [facility];  Service: Urology;  Laterality: Left;  . CYSTOSCOPY WITH STENT PLACEMENT Left 05/14/2019   Procedure: CYSTOSCOPY WITH STENT PLACEMENT, RETROGRADE;  Surgeon: Bjorn Loser, MD;  Location: [facility];  Service: Urology;  Laterality: Left;  . ELBOW SURGERY Right   . EXCISION VULVA CYST Bilateral 01-16-2005  @[facility]   . HOLMIUM LASER APPLICATION Left 25/42/7062   Procedure: HOLMIUM LASER APPLICATION;  Surgeon: Alexis Frock, MD;  [facility];  Service: Urology;  Laterality: Left;  . KNEE ARTHROSCOPY Right 2005;    02-11-2013  . KNEE SURGERY Right 08-20-2012   @[facility]    peroneal nerve neurolysis proximal tibia/ fibular articular branch   .  LAPAROSCOPIC CHOLECYSTECTOMY  1990s  . SACROSPINOUS LIGAMENT FIXATION  04-05-2017   @[facility]    UPHOLD vaginal mesh and  Anterior Repair  . SALPINGOOPHORECTOMY  09/11/2011   Procedure: SALPINGO OOPHERECTOMY;  Surgeon: Olga Millers, MD;  Location: [facility];  Service: Gynecology;  Laterality: Bilateral;  . SHOULDER SURGERY Right   . TONSILLECTOMY    . VAGINAL HYSTERECTOMY  09/11/2011   Procedure: HYSTERECTOMY VAGINAL;  Surgeon: Olga Millers, MD;  Location: [facility];  Service: Gynecology;  Laterality: N/A;  . [place] Dana Milagros Loll BIOPSY  09/11/2011   Procedure: VULVAR BIOPSY;  Surgeon: Olga Millers, MD;  Location: [facility];  Service: Gynecology;  Laterality: Left;  sebaceous cyst excision left vula    Family History  Problem Relation Age of Onset  . Skin cancer Mother   . Colon cancer Paternal Uncle        dx late 56s-60s  . Ovarian cancer Paternal Aunt 44  . Cancer Cousin        unk type    Social History   Socioeconomic History  . Marital status: Married    Spouse name: Not on file  . Number of children: Not on file  . Years of education: Not on file  . Highest education level: Not on file  Occupational History  . Not on file  Tobacco Use  . Smoking status: Never Smoker  . Smokeless tobacco: Never Used  Vaping Use  . Vaping Use: Never used  Substance and Sexual Activity  . Alcohol use: Yes    Comment: occasional  . Drug use: Never  . Sexual activity: Not on file  Other Topics Concern  . Not on file  Social History Narrative   Right handed   Social Determinants of Health   Financial Resource Strain: Low Risk   . Difficulty of Paying Living Expenses: Not hard at all  Food Insecurity: No Food Insecurity  . Worried About Charity fundraiser in the Last Year: Never true  . Ran Out of Food in the Last Year: Never true  Transportation Needs: No Transportation Needs  . Lack of Transportation (Medical): No  . Lack of Transportation (Non-Medical): No  Physical Activity: Sufficiently Active  . Days of Exercise per Week: 5 days  . Minutes of Exercise per Session: 30 min  Stress: No Stress Concern Present  . Feeling of Stress : Not at all  Social Connections: Socially Integrated  . Frequency of Communication with Friends and Family: More than three times a week  . Frequency of Social Gatherings with Friends and Family: More than three times a week  . Attends Religious Services: 1 to 4 times per year  . Active Member of Clubs or Organizations: Yes  . Attends Archivist Meetings: 1 to 4 times per year  . Marital Status: Married  Human resources officer  Violence: Not At Risk  . Fear of Current or Ex-Partner: No  . Emotionally Abused: No  . Physically Abused: No  . Sexually Abused: No    Outpatient Medications Prior to Visit  Medication Sig Dispense Refill  . ALBUTEROL IN Inhale into the lungs as needed.    . Ascorbic Acid (VITAMIN C) 1000 MG tablet Take 1,000 mg by mouth daily.    Marland Kitchen aspirin EC 81 MG tablet Take 81 mg by mouth daily.    . Calcium-Vitamin D-Vitamin K 500-500-40 MG-UNT-MCG CHEW Chew 2 tablets by mouth daily.    . Cholecalciferol (VITAMIN D3) 50 MCG (2000 UT) TABS  Take 50 mcg by mouth 2 (two) times daily.    . Coenzyme Q10 (COQ10 PO) Take 1 capsule by mouth daily.    Marland Kitchen EPINEPHrine 0.3 mg/0.3 mL IJ SOAJ injection SMARTSIG:1 Pre-Filled Pen Syringe IM As Needed    . pantoprazole (PROTONIX) 40 MG tablet TAKE 1 TABLET BY MOUTH AT BEDTIME 30 tablet 0  . PARoxetine (PAXIL) 20 MG tablet Take 1 tablet by mouth once daily 90 tablet 0  . tamoxifen (NOLVADEX) 10 MG tablet Take 1 tablet (10 mg total) by mouth daily. 90 tablet 3  . zinc gluconate 50 MG tablet Take 50 mg by mouth daily.    . mupirocin ointment (BACTROBAN) 2 % Apply 1 application topically 2 (two) times daily. To right nare. (Patient not taking: No sig reported) 22 g 0   No facility-administered medications prior to visit.    Allergies  Allergen Reactions  . Pravastatin     Other reaction(s): Myalgias (intolerance) 2016  . Erythromycin Nausea And Vomiting    Cramps   . Iohexol Hives     Code: HIVES, Desc: Pt's husband called when they got home from pt's CEPI #1.  Pt w/ hives/itching.  Instructed on taking Benadryl now, 1hr and q6hrs prn.  Instructed to add IV Contrast to allergy list, esp for CT scans, and to get pre-med info in the future.  jkl, Onset Date: [id number]   . Amoxicillin-Pot Clavulanate Other (See Comments)    GI Upset    . Codeine Nausea And Vomiting  . Moxifloxacin Itching  . Sulfamethoxazole Itching and Rash    ROS Review of Systems   Constitutional: Negative.   HENT: Negative.   Eyes: Negative for photophobia and visual disturbance.  Respiratory: Negative.   Cardiovascular: Negative.   Endocrine: Negative for polyphagia and polyuria.  Genitourinary: Positive for frequency. Negative for difficulty urinating, dysuria, hematuria and urgency.  Musculoskeletal: Positive for neck pain and neck stiffness.  Skin: Negative.   Neurological: Negative for speech difficulty and weakness.  Psychiatric/Behavioral: Negative.       Objective:    Physical Exam Vitals and nursing note reviewed.  Constitutional:      General: She is not in acute distress.    Appearance: Normal appearance. She is normal weight. She is not ill-appearing, toxic-appearing or diaphoretic.  HENT:     Head: Normocephalic and atraumatic.     Right Ear: Tympanic membrane, ear canal and external ear normal.     Left Ear: Tympanic membrane, ear canal and external ear normal.     Mouth/Throat:     Mouth: Mucous membranes are moist.     Pharynx: Oropharynx is clear. No oropharyngeal exudate or posterior oropharyngeal erythema.  Eyes:     Extraocular Movements: Extraocular movements intact.     Conjunctiva/sclera: Conjunctivae normal.     Pupils: Pupils are equal, round, and reactive to light.  Neck:     Vascular: No carotid bruit.  Cardiovascular:     Rate and Rhythm: Normal rate and regular rhythm.     Pulses: Normal pulses.          Carotid pulses are 2+ on the right side and 2+ on the left side.    Heart sounds: Normal heart sounds.  Pulmonary:     Breath sounds: Normal breath sounds.  Abdominal:     General: Bowel sounds are normal.  Musculoskeletal:     Cervical back: No rigidity, torticollis, tenderness or bony tenderness. No pain with movement. Normal range of motion.  Lymphadenopathy:  Cervical: No cervical adenopathy.  Neurological:     Mental Status: She is alert and oriented to person, place, and time.  Psychiatric:        Mood  and Affect: Mood normal.        Behavior: Behavior normal.     BP 115/64   Pulse 76   Temp 97.7 F (36.5 C) (Temporal)   Ht 4\' 11"  (1.499 m)   Wt 118 lb 9.6 oz (53.8 kg)   SpO2 96%   BMI 23.95 kg/m  Wt Readings from Last 3 Encounters:  11/10/20 118 lb 9.6 oz (53.8 kg)  10/19/20 116 lb (52.6 kg)  08/19/20 116 lb 9.6 oz (52.9 kg)     Health Maintenance Due  Topic Date Due  . Hepatitis C Screening  Never done  . DEXA SCAN  Never done  . TETANUS/TDAP  02/16/2018    There are no preventive care reminders to display for this patient.  Lab Results  Component Value Date   TSH 1.30 07/22/2019   Lab Results  Component Value Date   WBC 3.6 (L) 02/02/2020   HGB 13.0 02/02/2020   HCT 39.8 02/02/2020   MCV 92.2 02/02/2020   PLT 281.0 02/02/2020   Lab Results  Component Value Date   NA 141 02/02/2020   K 4.8 02/02/2020   CO2 32 02/02/2020   GLUCOSE 85 02/02/2020   BUN 19 02/02/2020   CREATININE 0.67 02/02/2020   BILITOT 0.3 02/02/2020   ALKPHOS 49 02/02/2020   AST 19 02/02/2020   ALT 12 02/02/2020   PROT 6.5 02/02/2020   ALBUMIN 3.8 02/02/2020   CALCIUM 9.2 02/02/2020   ANIONGAP 9 07/30/2019   GFR 86.18 02/02/2020   Lab Results  Component Value Date   CHOL 193 07/22/2019   Lab Results  Component Value Date   HDL 54.80 07/22/2019   Lab Results  Component Value Date   LDLCALC 122 (H) 07/22/2019   Lab Results  Component Value Date   TRIG 84.0 07/22/2019   Lab Results  Component Value Date   CHOLHDL 4 07/22/2019   Lab Results  Component Value Date   HGBA1C 5.7 07/22/2019      Assessment & Plan:   Problem List Items Addressed This Visit      Other   History of elevated glucose   Relevant Orders   Hemoglobin A1c   Elevated LDL cholesterol level   Relevant Orders   CBC   Comprehensive metabolic panel   LDL cholesterol, direct   Healthcare maintenance   Relevant Orders   Type and screen   DG Bone Density   Neck pain   Urinary frequency    Relevant Orders   Urinalysis, Routine w reflex microscopic   Urine cytology ancillary only    Other Visit Diagnoses    Depression with anxiety    -  Primary      No orders of the defined types were placed in this encounter.   Follow-up: Return in about 6 months (around 05/12/2021).  Continue all medicines as above.  She will continue working with a chiropractor for her neck pain  Libby Maw, MD

## 2020-11-15 ENCOUNTER — Telehealth: Payer: Self-pay | Admitting: Family Medicine

## 2020-11-15 ENCOUNTER — Other Ambulatory Visit: Payer: Self-pay | Admitting: Hematology and Oncology

## 2020-11-15 LAB — URINE CYTOLOGY ANCILLARY ONLY: Candida Urine: NEGATIVE

## 2020-11-15 NOTE — Telephone Encounter (Signed)
Pt is wanting a cb concerning her most recent lab results. Please advise at [phone].

## 2020-11-17 ENCOUNTER — Other Ambulatory Visit: Payer: Self-pay | Admitting: Family

## 2020-11-17 NOTE — Telephone Encounter (Signed)
Returned patients call, at the time of call patient not at home spoke with husband who verbally understood lab results. Per patient and husbands request results printed and mailed to the home. Informed that these could be viewed online due to access to Mychart but they preferred to have then mailed.

## 2020-11-23 ENCOUNTER — Other Ambulatory Visit: Payer: Self-pay | Admitting: Family Medicine

## 2020-11-23 NOTE — Telephone Encounter (Signed)
Patient is calling regarding refill request. She said that she needs this sent in today (if possible). Please call her back at [phone] if you have any questions.

## 2020-12-13 DIAGNOSIS — R509 Fever, unspecified: Secondary | ICD-10-CM | POA: Diagnosis not present

## 2020-12-13 DIAGNOSIS — R059 Cough, unspecified: Secondary | ICD-10-CM | POA: Diagnosis not present

## 2020-12-13 DIAGNOSIS — J180 Bronchopneumonia, unspecified organism: Secondary | ICD-10-CM | POA: Diagnosis not present

## 2020-12-15 ENCOUNTER — Other Ambulatory Visit: Payer: Self-pay | Admitting: Family Medicine

## 2020-12-15 DIAGNOSIS — F418 Other specified anxiety disorders: Secondary | ICD-10-CM

## 2021-01-05 ENCOUNTER — Telehealth: Payer: Self-pay

## 2021-01-05 NOTE — Telephone Encounter (Signed)
PA form for the Paroxetine 20 mg tabs filled out and faxed to [facility] @ [phone].  awaitng response. Dm/cma

## 2021-01-10 NOTE — Assessment & Plan Note (Signed)
07/28/2019:Routine screening mammogram detected a 1.9cm group of calcifications in the left breast. Biopsy showed DCIS, high grade, ER+ 95%, PR+ 90%. Tis NX stage 0 08/12/2019: Left lumpectomy: High-grade DCIS with necrosis and calcifications 1.5 cm, margins negative, ER 95%, PR 90%  Treatment plan: 1.Adjuvant radiation therapy 09/11/2019-10/06/2019 2.Follow-up adjuvant antiestrogen therapy with tamoxifen x5 years started 10/19/20 ------------------------------------------------------------------------------------------------------------------------ Tamoxifen Toxicities:  Breast Cancer Surveillance: 1. Breast Exam: 01/11/21: Benign 2/ Mammogram: 07/06/20: Brenda Delgado Benign

## 2021-01-10 NOTE — Progress Notes (Signed)
Patient Care Team: Libby Maw, MD as PCP - General (Family Medicine) Mauro Kaufmann, RN as Oncology Nurse Navigator Rockwell Germany, RN as Oncology Nurse Navigator Alphonsa Overall, MD as Consulting Physician (General Surgery) Nicholas Lose, MD as Consulting Physician (Hematology and Oncology) Eppie Gibson, MD as Attending Physician (Radiation Oncology)  DIAGNOSIS:    ICD-10-CM   1. Ductal carcinoma in situ (DCIS) of left breast  D05.12     SUMMARY OF ONCOLOGIC HISTORY: Oncology History  Ductal carcinoma in situ (DCIS) of left breast  07/28/2019 Initial Diagnosis   Routine screening mammogram detected a 1.9cm group of calcifications in the left breast. Biopsy showed DCIS, high grade, ER+ 95%, PR+ 90%.   07/30/2019 Cancer Staging   Staging form: Breast, AJCC 8th Edition - Clinical: Stage 0 (cTis (DCIS), cN0, cM0, G3, ER+, PR+, HER2: Not Assessed)    08/12/2019 Genetic Testing   Negative genetic testing. No pathogenic variants identified, VUS in MSH2 Gain (Exons 11-16) identified on the [facility] Common Hereditary Cancers Panel + Breast Cancer STAT Panel. The STAT Breast cancer panel offered by [facility] includes sequencing and rearrangement analysis for the following 9 genes:  ATM, BRCA1, BRCA2, CDH1, CHEK2, PALB2, PTEN, STK11 and TP53.  The Common Hereditary Cancers Panel offered by [facility] includes sequencing and/or deletion duplication testing of the following 47 genes: APC, ATM, AXIN2, BARD1, BMPR1A, BRCA1, BRCA2, BRIP1, CDH1, CDKN2A (p14ARF), CDKN2A (p16INK4a), CKD4, CHEK2, CTNNA1, DICER1, EPCAM (Deletion/duplication testing only), GREM1 (promoter region deletion/duplication testing only), KIT, MEN1, MLH1, MSH2, MSH3, MSH6, MUTYH, NBN, NF1, NHTL1, PALB2, PDGFRA, PMS2, POLD1, POLE, PTEN, RAD50, RAD51C, RAD51D, SDHB, SDHC, SDHD, SMAD4, SMARCA4. STK11, TP53, TSC1, TSC2, and VHL.  The following genes were evaluated for sequence changes only: SDHA and HOXB13 c.251G>A variant only.  The report date is 08/12/2019.   08/12/2019 Cancer Staging   Staging form: Breast, AJCC 8th Edition - Pathologic stage from 08/12/2019: Stage 0 (pTis (DCIS), pN0, cM0, ER+, PR+)   08/12/2019 Surgery   Left Breast Lumpectomy [name]) [phone]): high grade DCIS with calcifications and necrosis, spanning 1.5cm. Additional left lateral, medial, and superior magrins showed fibrocystic changes. No regional lymph nodes were examined. ER+ PR+.   09/10/2019 - 10/07/2019 Radiation Therapy   The patient initially received a dose of 40.05 Gy in 15 fractions to the breast using whole-breast tangent fields. This was delivered using a 3-D conformal technique. The pt received a boost delivering an additional 10 Gy in 5 fractions using a electron boost with 37mV electrons. The total dose was 50.05 Gy.   10/2019 - 10/2024 Anti-estrogen oral therapy   Tamoxifen     CHIEF COMPLIANT: Follow-up of left breast DCIS on tamoxifen   INTERVAL HISTORY: Brenda TODISCOis a 75y.o. with above-mentioned history of left breast DCIS for which she underwent a lumpectomy, radiation, and is currently on antiestrogen therapy with tamoxifen. She presents to the clinic today for follow-up.  She is tolerating tamoxifen extremely well without any problems or concerns.  Denies any lumps or nodules in the breast.  She does feel tenderness along the surgical scar.  ALLERGIES:  is allergic to pravastatin, erythromycin, iohexol, amoxicillin-pot clavulanate, codeine, moxifloxacin, and sulfamethoxazole.  MEDICATIONS:  Current Outpatient Medications  Medication Sig Dispense Refill  . ALBUTEROL IN Inhale into the lungs as needed.    . Ascorbic Acid (VITAMIN C) 1000 MG tablet Take 1,000 mg by mouth daily.    .Marland Kitchenaspirin EC 81 MG tablet Take 81 mg by mouth daily.    .Marland Kitchen  Calcium-Vitamin D-Vitamin K [id number] MG-UNT-MCG CHEW Chew 2 tablets by mouth daily.    . Cholecalciferol (VITAMIN D3) 50 MCG (2000 UT) TABS Take 50 mcg by mouth 2 (two) times  daily.    . Coenzyme Q10 (COQ10 PO) Take 1 capsule by mouth daily.    Marland Kitchen EPINEPHrine 0.3 mg/0.3 mL IJ SOAJ injection SMARTSIG:1 Pre-Filled Pen Syringe IM As Needed    . mupirocin ointment (BACTROBAN) 2 % Apply 1 application topically 2 (two) times daily. To right nare. (Patient not taking: No sig reported) 22 g 0  . pantoprazole (PROTONIX) 40 MG tablet TAKE 1 TABLET BY MOUTH AT BEDTIME 30 tablet 1  . PARoxetine (PAXIL) 20 MG tablet Take 1 tablet by mouth once daily 90 tablet 1  . tamoxifen (NOLVADEX) 10 MG tablet Take 1 tablet (10 mg total) by mouth daily. 90 tablet 3  . zinc gluconate 50 MG tablet Take 50 mg by mouth daily.     No current facility-administered medications for this visit.    PHYSICAL EXAMINATION: ECOG PERFORMANCE STATUS: 1 - Symptomatic but completely ambulatory  Vitals:   01/11/21 1058  BP: 129/67  Pulse: 70  Resp: 19  Temp: 97.7 F (36.5 C)  SpO2: 98%   Filed Weights   01/11/21 1058  Weight: 120 lb 4.8 oz (54.6 kg)    BREAST: No palpable masses or nodules in either right or left breasts. No palpable axillary supraclavicular or infraclavicular adenopathy no breast tenderness or nipple discharge. (exam performed in the presence of a chaperone)  LABORATORY DATA:  I have reviewed the data as listed CMP Latest Ref Rng & Units 11/10/2020 02/02/2020 07/30/2019  Glucose 70 - 99 mg/dL 94 85 97  BUN 6 - 23 mg/dL '15 19 16  ' Creatinine 0.40 - 1.20 mg/dL 0.64 0.67 0.67  Sodium 135 - 145 mEq/L 140 141 143  Potassium 3.5 - 5.1 mEq/L 4.4 4.8 4.4  Chloride 96 - 112 mEq/L 103 103 104  CO2 19 - 32 mEq/L 32 32 30  Calcium 8.4 - 10.5 mg/dL 9.1 9.2 8.9  Total Protein 6.0 - 8.3 g/dL 6.2 6.5 6.5  Total Bilirubin 0.2 - 1.2 mg/dL 0.5 0.3 0.4  Alkaline Phos 39 - 117 U/L 51 49 72  AST 0 - 37 U/L '19 19 18  ' ALT 0 - 35 U/L '13 12 12    ' Lab Results  Component Value Date   WBC 2.4 Repeated and verified X2. (L) 11/10/2020   HGB 14.3 11/10/2020   HCT 43.0 11/10/2020   MCV 91.7  11/10/2020   PLT 250.0 11/10/2020   NEUTROABS 1.7 07/30/2019    ASSESSMENT & PLAN:  Ductal carcinoma in situ (DCIS) of left breast 07/28/2019:Routine screening mammogram detected a 1.9cm group of calcifications in the left breast. Biopsy showed DCIS, high grade, ER+ 95%, PR+ 90%. Tis NX stage 0 08/12/2019: Left lumpectomy: High-grade DCIS with necrosis and calcifications 1.5 cm, margins negative, ER 95%, PR 90%  Treatment plan: 1.Adjuvant radiation therapy 09/11/2019-10/06/2019 2.Follow-up adjuvant antiestrogen therapy with tamoxifen x5 years started 10/19/20 ------------------------------------------------------------------------------------------------------------------------ Tamoxifen Toxicities: Tolerating it extremely well without any problems or concerns Denies any hot flashes or arthralgias or myalgias.  Multiple drainage procedures for seroma  Breast Cancer Surveillance: 1. Breast Exam: 01/11/21: Benign 2. Mammogram: 07/06/20: [facility] Benign  I renewed her prescription for tamoxifen for another year. Return to clinic in 1 year for follow-up   No orders of the defined types were placed in this encounter.  The patient has a good understanding  of the overall plan. she agrees with it. she will call with any problems that may develop before the next visit here.  Total time spent: 20 mins including face to face time and time spent for planning, charting and coordination of care  Rulon Eisenmenger, MD, MPH 01/11/2021  I, Cloyde Reams Dorshimer, am acting as scribe for Dr. Nicholas Lose.  I have reviewed the above documentation for accuracy and completeness, and I agree with the above.

## 2021-01-11 ENCOUNTER — Telehealth: Payer: Self-pay | Admitting: Hematology and Oncology

## 2021-01-11 ENCOUNTER — Other Ambulatory Visit: Payer: Self-pay | Admitting: Family Medicine

## 2021-01-11 ENCOUNTER — Other Ambulatory Visit: Payer: Self-pay

## 2021-01-11 ENCOUNTER — Inpatient Hospital Stay: Payer: Medicare Other | Attending: Hematology and Oncology | Admitting: Hematology and Oncology

## 2021-01-11 DIAGNOSIS — Z170 Estrogen receptor positive status [ER+]: Secondary | ICD-10-CM | POA: Diagnosis not present

## 2021-01-11 DIAGNOSIS — Z79810 Long term (current) use of selective estrogen receptor modulators (SERMs): Secondary | ICD-10-CM | POA: Diagnosis not present

## 2021-01-11 DIAGNOSIS — D0512 Intraductal carcinoma in situ of left breast: Secondary | ICD-10-CM | POA: Insufficient documentation

## 2021-01-11 DIAGNOSIS — Z923 Personal history of irradiation: Secondary | ICD-10-CM | POA: Diagnosis not present

## 2021-01-11 MED ORDER — PRESERVISION AREDS 2 PO CAPS: 2.0000 | ORAL_CAPSULE | Freq: Every day | ORAL | Status: DC

## 2021-01-11 MED ORDER — TAMOXIFEN CITRATE 10 MG PO TABS: 10.0000 mg | ORAL_TABLET | Freq: Every day | ORAL | 3 refills | Status: DC

## 2021-01-11 NOTE — Telephone Encounter (Signed)
Scheduled appointment per 06/07 los. Patient is aware.

## 2021-01-24 DIAGNOSIS — S46012A Strain of muscle(s) and tendon(s) of the rotator cuff of left shoulder, initial encounter: Secondary | ICD-10-CM | POA: Diagnosis not present

## 2021-01-27 DIAGNOSIS — M25562 Pain in left knee: Secondary | ICD-10-CM | POA: Diagnosis not present

## 2021-01-31 DIAGNOSIS — S46012A Strain of muscle(s) and tendon(s) of the rotator cuff of left shoulder, initial encounter: Secondary | ICD-10-CM | POA: Diagnosis not present

## 2021-02-16 DIAGNOSIS — Z780 Asymptomatic menopausal state: Secondary | ICD-10-CM | POA: Diagnosis not present

## 2021-02-16 DIAGNOSIS — M85852 Other specified disorders of bone density and structure, left thigh: Secondary | ICD-10-CM | POA: Diagnosis not present

## 2021-02-16 LAB — HM DEXA SCAN

## 2021-02-21 ENCOUNTER — Telehealth: Payer: Self-pay | Admitting: Family Medicine

## 2021-02-21 NOTE — Telephone Encounter (Signed)
No medication can be sent in without evaluation. Patient will need to be seen at urgent care if we are booked.

## 2021-02-21 NOTE — Telephone Encounter (Signed)
Pt called and wanted to know if she can have something sent in for a sinus infection without an appt. She said she had something similar back in May

## 2021-02-22 ENCOUNTER — Other Ambulatory Visit: Payer: Self-pay | Admitting: Family Medicine

## 2021-02-22 NOTE — Telephone Encounter (Signed)
Pt called back and I made her aware and had let her know earlier about it as well. I let her know that unfortunately we didn't have any same day and that an urgent care would be the quickest option. Pt wasn't happy and hung up on me

## 2021-02-25 ENCOUNTER — Encounter: Payer: Self-pay | Admitting: Family Medicine

## 2021-02-28 DIAGNOSIS — S46012D Strain of muscle(s) and tendon(s) of the rotator cuff of left shoulder, subsequent encounter: Secondary | ICD-10-CM | POA: Diagnosis not present

## 2021-03-24 ENCOUNTER — Encounter (HOSPITAL_BASED_OUTPATIENT_CLINIC_OR_DEPARTMENT_OTHER): Payer: Self-pay | Admitting: Emergency Medicine

## 2021-03-24 ENCOUNTER — Other Ambulatory Visit: Payer: Self-pay

## 2021-03-24 ENCOUNTER — Emergency Department (HOSPITAL_BASED_OUTPATIENT_CLINIC_OR_DEPARTMENT_OTHER): Payer: Medicare Other

## 2021-03-24 ENCOUNTER — Emergency Department (HOSPITAL_BASED_OUTPATIENT_CLINIC_OR_DEPARTMENT_OTHER)
Admission: EM | Admit: 2021-03-24 | Discharge: 2021-03-24 | Disposition: A | Discharge status: Home / Self Care | Payer: Medicare Other | Attending: Emergency Medicine | Admitting: Emergency Medicine

## 2021-03-24 DIAGNOSIS — R519 Headache, unspecified: Secondary | ICD-10-CM | POA: Diagnosis not present

## 2021-03-24 DIAGNOSIS — U071 COVID-19: Secondary | ICD-10-CM | POA: Diagnosis not present

## 2021-03-24 DIAGNOSIS — Z7982 Long term (current) use of aspirin: Secondary | ICD-10-CM | POA: Insufficient documentation

## 2021-03-24 DIAGNOSIS — Z853 Personal history of malignant neoplasm of breast: Secondary | ICD-10-CM | POA: Diagnosis not present

## 2021-03-24 DIAGNOSIS — R059 Cough, unspecified: Secondary | ICD-10-CM | POA: Diagnosis not present

## 2021-03-24 DIAGNOSIS — J45909 Unspecified asthma, uncomplicated: Secondary | ICD-10-CM | POA: Diagnosis not present

## 2021-03-24 MED ORDER — MOLNUPIRAVIR EUA 200MG CAPSULE: 4.0000 | ORAL_CAPSULE | Freq: Two times a day (BID) | ORAL | 0 refills | Status: AC

## 2021-03-24 NOTE — ED Provider Notes (Signed)
[facility] Provider Note   CSN: [id number] Arrival date & time: 03/24/21  1933     History Chief Complaint  Patient presents with   Covid Positive    Brenda Delgado is a 75 y.o. female.  Tested positive for COVID today.No hypoxia at home.  Has had mild cough and fever.  Fully vaccinated.  The history is provided by the patient.  URI Presenting symptoms: cough   Presenting symptoms: no ear pain, no fever and no sore throat   Cough:    Cough characteristics:  Non-productive   Duration:  1 day   Timing:  Intermittent   Progression:  Waxing and waning   Chronicity:  New Relieved by:  Nothing Worsened by:  Nothing Associated symptoms: myalgias   Associated symptoms: no arthralgias   Risk factors: sick contacts ([place] with covid)       Past Medical History:  Diagnosis Date   Allergy    Anxiety    Arthritis    right knee   Cancer ([place]) 07/2019   left breast DCIS   Complication of anesthesia    per pt with surgery 05-14-2019 right facial swelling and lip/ mouth blister's   Constipation    Depression    Environmental and seasonal allergies    Family history of colon cancer    Family history of ovarian cancer    GERD (gastroesophageal reflux disease)    Hiatal hernia    History of kidney stones    Left ureteral stone    Mild asthma    Nephrolithiasis    Nocturia    PONV (postoperative nausea and vomiting)    Pre-diabetes    Renal calculus, left    Wears glasses    Wears hearing aid in both ears     Patient Active Problem List   Diagnosis Date Noted   Neck pain 11/10/2020   Urinary frequency 11/10/2020   Abrasion of nose 02/02/2020   Memory change 02/02/2020   Healthcare maintenance 08/13/2019   Family history of ovarian cancer    Family history of colon cancer    Ductal carcinoma in situ (DCIS) of left breast 07/28/2019   History of elevated glucose 07/22/2019   Elevated LDL cholesterol level 07/22/2019   Tremor of  face and hands 07/22/2019   Left ureteral stone 05/14/2019   METATARSALGIA 04/15/2008   FOOT PAIN, BILATERAL 04/15/2008    Past Surgical History:  Procedure Laterality Date   ABDOMINAL HYSTERECTOMY     ANTERIOR CERVICAL DECOMP/DISCECTOMY FUSION  01-06-2010   '@[facility]'$    C4--5   BREAST LUMPECTOMY WITH RADIOACTIVE SEED LOCALIZATION Left 08/12/2019   Procedure: LEFT BREAST LUMPECTOMY WITH RADIOACTIVE SEED LOCALIZATION;  Surgeon: Alphonsa Overall, MD;  Location: [facility];  Service: General;  Laterality: Left;   CARPAL TUNNEL RELEASE Right 2000   cataracts  2018   bilateral   CYSTOCELE REPAIR  09/11/2011   Procedure: ANTERIOR REPAIR (CYSTOCELE);  Surgeon: Olga Millers, MD;  Location: [facility];  Service: Gynecology;  Laterality: N/A;   CYSTOSCOPY  09/11/2011   Procedure: CYSTOSCOPY;  Surgeon: Olga Millers, MD;  Location: [facility];  Service: Gynecology;  Laterality: N/A;   CYSTOSCOPY WITH RETROGRADE PYELOGRAM, URETEROSCOPY AND STENT PLACEMENT Left 05/28/2019   Procedure: CYSTOSCOPY WITH RETROGRADE PYELOGRAM, URETEROSCOPY AND STENT REPLACEMENT;  Surgeon: Alexis Frock, MD;  Location: [facility];  Service: Urology;  Laterality: Left;   CYSTOSCOPY WITH STENT PLACEMENT Left 05/14/2019   Procedure: CYSTOSCOPY WITH STENT  PLACEMENT, RETROGRADE;  Surgeon: Bjorn Loser, MD;  Location: [facility];  Service: Urology;  Laterality: Left;   ELBOW SURGERY Right    EXCISION VULVA CYST Bilateral 01-16-2005  '@[facility]'$    HOLMIUM LASER APPLICATION Left [id number]   Procedure: HOLMIUM LASER APPLICATION;  Surgeon: Alexis Frock, MD;  Location: [facility];  Service: Urology;  Laterality: Left;   KNEE ARTHROSCOPY Right 2005;    02-11-2013   KNEE SURGERY Right 08-20-2012   '@[facility]'$    peroneal nerve neurolysis proximal tibia/ fibular articular branch    LAPAROSCOPIC CHOLECYSTECTOMY  1990s   SACROSPINOUS LIGAMENT FIXATION  04-05-2017   '@[facility]'$    UPHOLD vaginal mesh and  Anterior Repair    SALPINGOOPHORECTOMY  09/11/2011   Procedure: SALPINGO OOPHERECTOMY;  Surgeon: Olga Millers, MD;  Location: [facility];  Service: Gynecology;  Laterality: Bilateral;   SHOULDER SURGERY Right    TONSILLECTOMY     VAGINAL HYSTERECTOMY  09/11/2011   Procedure: HYSTERECTOMY VAGINAL;  Surgeon: Olga Millers, MD;  Location: [facility];  Service: Gynecology;  Laterality: N/A;   VULVA Milagros Loll BIOPSY  09/11/2011   Procedure: VULVAR BIOPSY;  Surgeon: Olga Millers, MD;  Location: [facility];  Service: Gynecology;  Laterality: Left;  sebaceous cyst excision left vula     OB History   No obstetric history on file.     Family History  Problem Relation Age of Onset   Skin cancer Mother    Colon cancer Paternal Uncle        dx late 29s-60s   Ovarian cancer Paternal Aunt 47   Cancer Cousin        unk type    Social History   Tobacco Use   Smoking status: Never   Smokeless tobacco: Never  Vaping Use   Vaping Use: Never used  Substance Use Topics   Alcohol use: Yes    Comment: occasional   Drug use: Never    Home Medications Prior to Admission medications   Medication Sig Start Date End Date Taking? Authorizing Provider  molnupiravir EUA 200 mg CAPS Take 4 capsules (800 mg total) by mouth 2 (two) times daily for 5 days. 03/24/21 03/29/21 Yes Arwen Haseley, DO  ALBUTEROL IN Inhale into the lungs as needed.    [provider]  Ascorbic Acid (VITAMIN C) 1000 MG tablet Take 1,000 mg by mouth daily.    [provider]  aspirin EC 81 MG tablet Take 81 mg by mouth daily.    [provider]  Calcium-Vitamin D-Vitamin K 500-500-40 MG-UNT-MCG CHEW Chew 2 tablets by mouth daily.    [provider]  Cholecalciferol (VITAMIN D3) 50 MCG (2000 UT) TABS Take 50 mcg by mouth 2 (two) times daily.    [provider]  Coenzyme Q10 (COQ10 PO) Take 1 capsule by mouth daily.    [provider]  EPINEPHrine 0.3 mg/0.3 mL IJ SOAJ injection SMARTSIG:1 Pre-Filled Pen  Syringe IM As Needed 06/02/19   [provider]  Multiple Vitamins-Minerals (PRESERVISION AREDS 2) CAPS Take 2 capsules by mouth daily. 01/11/21   Nicholas Lose, MD  pantoprazole (PROTONIX) 40 MG tablet TAKE 1 TABLET BY MOUTH AT BEDTIME 02/22/21   Libby Maw, MD  PARoxetine (PAXIL) 20 MG tablet Take 1 tablet by mouth once daily 12/15/20   Libby Maw, MD  tamoxifen (NOLVADEX) 10 MG tablet Take 1 tablet (10 mg total) by mouth daily. 01/11/21   Nicholas Lose, MD  zinc gluconate 50 MG tablet Take 50  mg by mouth daily.    [provider]    Allergies    Pravastatin, Erythromycin, Iohexol, Amoxicillin-pot clavulanate, Codeine, Moxifloxacin, and Sulfamethoxazole  Review of Systems   Review of Systems  Constitutional:  Negative for chills and fever.  HENT:  Negative for ear pain and sore throat.   Eyes:  Negative for pain and visual disturbance.  Respiratory:  Positive for cough. Negative for shortness of breath.   Cardiovascular:  Negative for chest pain and palpitations.  Gastrointestinal:  Negative for abdominal pain and vomiting.  Genitourinary:  Negative for dysuria and hematuria.  Musculoskeletal:  Positive for myalgias. Negative for arthralgias and back pain.  Skin:  Negative for color change and rash.  Neurological:  Negative for seizures and syncope.  All other systems reviewed and are negative.  Physical Exam Updated Vital Signs BP (!) 128/97 (BP Location: Left Arm)   Pulse 77   Temp 98.7 F (37.1 C) (Oral)   Resp 18   Ht '4\' 11"'$  (1.499 m)   Wt 52.6 kg   SpO2 100%   BMI 23.43 kg/m   Physical Exam Vitals and nursing note reviewed.  Constitutional:      General: She is not in acute distress.    Appearance: She is well-developed.  HENT:     Head: Normocephalic and atraumatic.     Mouth/Throat:     Mouth: Mucous membranes are moist.  Eyes:     Conjunctiva/sclera: Conjunctivae normal.     Pupils: Pupils are equal, round, and reactive to  light.  Cardiovascular:     Rate and Rhythm: Normal rate and regular rhythm.     Pulses: Normal pulses.     Heart sounds: Normal heart sounds. No murmur heard. Pulmonary:     Effort: Pulmonary effort is normal. No respiratory distress.     Breath sounds: Normal breath sounds.  Abdominal:     Palpations: Abdomen is soft.     Tenderness: There is no abdominal tenderness.  Musculoskeletal:     Cervical back: Neck supple.  Skin:    General: Skin is warm and dry.     Capillary Refill: Capillary refill takes less than 2 seconds.  Neurological:     Mental Status: She is alert.    ED Results / Procedures / Treatments   Labs (all labs ordered are listed, but only abnormal results are displayed) Labs Reviewed - No data to display  EKG None  Radiology DG Chest Portable 1 View  Result Date: 03/24/2021 CLINICAL DATA:  Cough and headache. EXAM: PORTABLE CHEST 1 VIEW COMPARISON:  January 06, 2010 FINDINGS: The heart size and mediastinal contours are within normal limits. Both lungs are clear. Chronic focal sclerotic changes are seen involving the head of the right clavicle. IMPRESSION: No active cardiopulmonary disease. Electronically Signed   By: Virgina Norfolk M.D.   On: 03/24/2021 20:19    Procedures Procedures   Medications Ordered in ED Medications - No data to display  ED Course  I have reviewed the triage vital signs and the nursing notes.  Pertinent labs & imaging results that were available during my care of the patient were reviewed by me and considered in my medical decision making (see chart for details).    MDM Rules/Calculators/A&P                           LYRICAL CHADDERDON is here for evaluation after testing positive for COVID.  Normal vitals.  No fever.  Chest x-ray without evidence of infection.  We will start her on antiviral.  Symptoms for 2 days.  No signs of respiratory distress.  Has pulse ox at home.  Fully vaccinated.  Understands return precautions.   Discharged in good condition.  This chart was dictated using voice recognition software.  Despite best efforts to proofread,  errors can occur which can change the documentation meaning.  Final Clinical Impression(s) / ED Diagnoses Final diagnoses:  [id number]    Rx / DC Orders ED Discharge Orders          Ordered    molnupiravir EUA 200 mg CAPS  2 times daily        03/24/21 2030             Brenda Sites, DO 03/24/21 2032

## 2021-03-24 NOTE — Discharge Instructions (Addendum)
Chest x-ray is normal.  Take antiviral as prescribed.  Please return if symptoms worsen.

## 2021-03-24 NOTE — ED Triage Notes (Signed)
Patient arrived via POV c/o covid symptoms x 1 day. Patient states cough, headache, sore throat, increased tremor. Patient is AO x 4, VS WDL, normal gait.

## 2021-04-03 ENCOUNTER — Other Ambulatory Visit: Payer: Self-pay | Admitting: Family Medicine

## 2021-04-03 DIAGNOSIS — F418 Other specified anxiety disorders: Secondary | ICD-10-CM

## 2021-04-13 DIAGNOSIS — J301 Allergic rhinitis due to pollen: Secondary | ICD-10-CM | POA: Diagnosis not present

## 2021-04-13 DIAGNOSIS — J3089 Other allergic rhinitis: Secondary | ICD-10-CM | POA: Diagnosis not present

## 2021-04-13 DIAGNOSIS — J4520 Mild intermittent asthma, uncomplicated: Secondary | ICD-10-CM | POA: Diagnosis not present

## 2021-04-13 DIAGNOSIS — J3081 Allergic rhinitis due to animal (cat) (dog) hair and dander: Secondary | ICD-10-CM | POA: Diagnosis not present

## 2021-05-11 ENCOUNTER — Encounter: Payer: Self-pay | Admitting: Family Medicine

## 2021-05-16 NOTE — Telephone Encounter (Signed)
Kendrick Fries spoke to [place], pt husband, asked that I return call about billing DOS 08/19/2020.   I called but he was not available. I will call again.  I sent a follow up email to [facility] & Orlene Och in billing regarding $25 "bad debt" showing in transaction inquiry for this DOS.

## 2021-05-19 NOTE — Telephone Encounter (Signed)
Spoke to pt husband, Brenda Delgado, re: DOS 08/19/2020  He states that he called the billing ph# and spoke to [place] a few days ago and was told that he will personally have to file appeal with pts ins. He asked why this responsibility it put back on him. I advised that I am not familiar will all the billing processes and insurance guidelines as I am not a biller. I apologized for not having an answer for him and advised I had emailed billing mgmt. Pt asked if he needs to call and ask for a Agricultural engineer. I did advise if he didn't feel he was getting proper resolution, asking for a manager would be acceptable.   Pt voiced concern of access for OV/VV within [facility]. I advised him that we recently had a provider leave and have 2 new providers starting at the beginning of 2023.

## 2021-06-26 IMAGING — CT CT ABD-PELV W/O CM
2 of 4 series · 16 of 46 positions shown, 18 images · non-contrast
Comparison: None.

CLINICAL DATA: Low abdominal pain since this morning. Previous
cholecystectomy and hysterectomy.

EXAM:
CT ABDOMEN AND PELVIS WITHOUT CONTRAST
TECHNIQUE: Multidetector CT imaging of the abdomen and pelvis was performed
following the standard protocol without IV contrast.

[Series 2: axial st · axial · 0.69mm/px · z∈[-612,-242]mm · 13 of 82 slices shown, 15 images]
[im 4/82  soft-tissue]
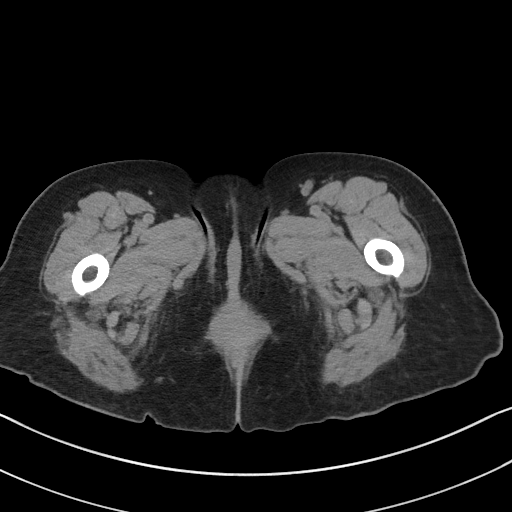
[im 4/82  bone]
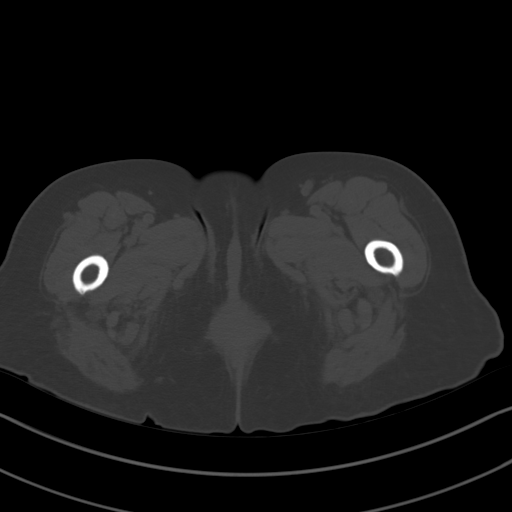
[im 11/82  soft-tissue]
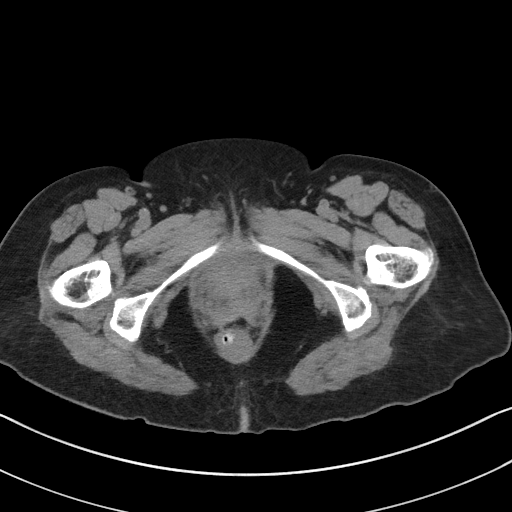
[im 17/82  soft-tissue]
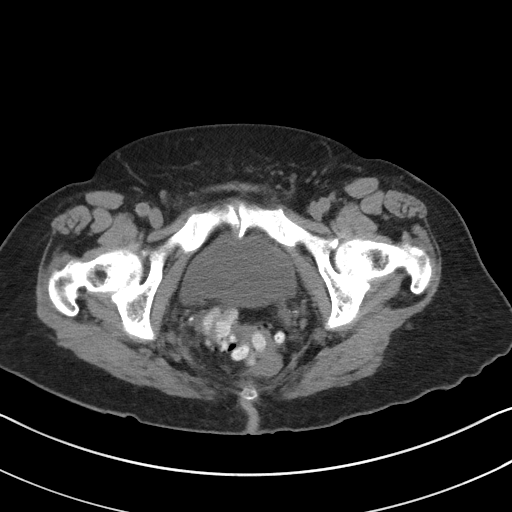
[im 24/82  soft-tissue]
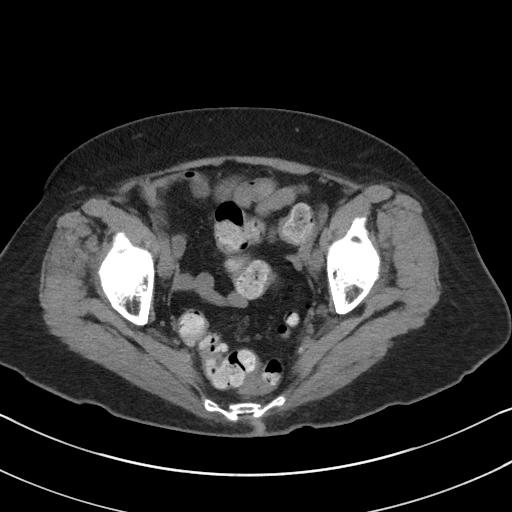
[im 28/82  soft-tissue]
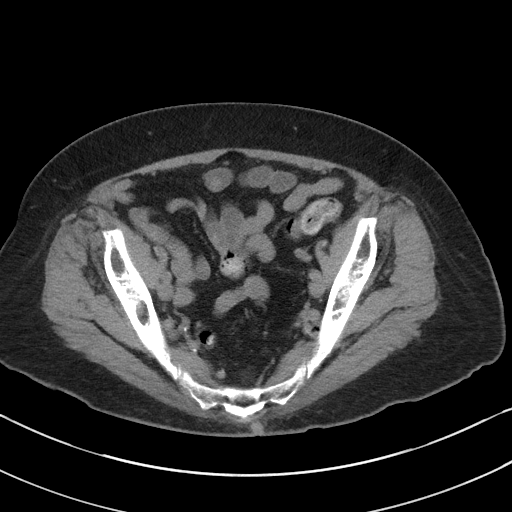
[im 34/82  soft-tissue]
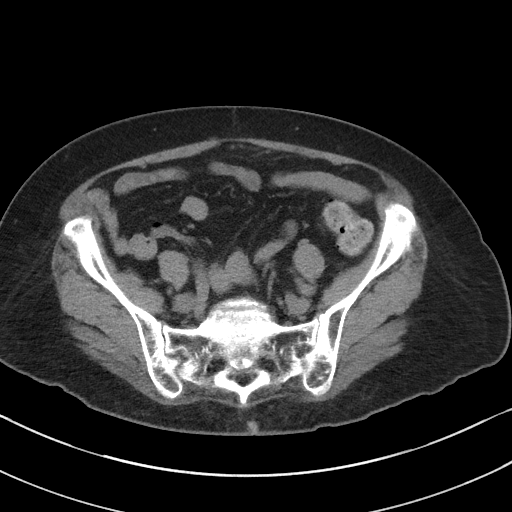
[im 41/82  soft-tissue]
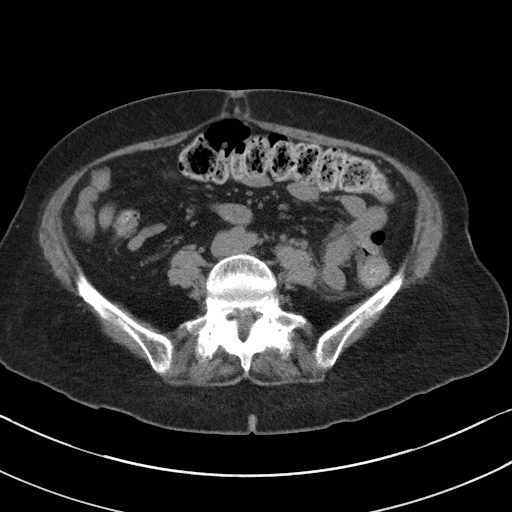
[im 48/82  soft-tissue]
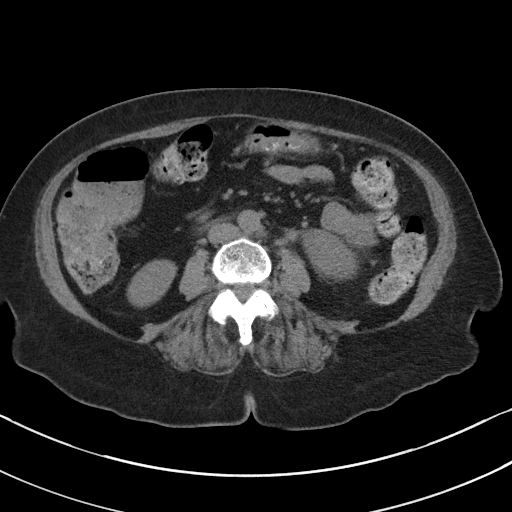
[im 55/82  soft-tissue]
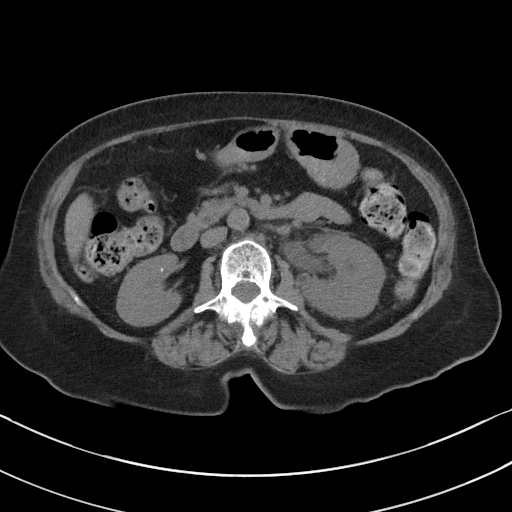
[im 55/82  bone]
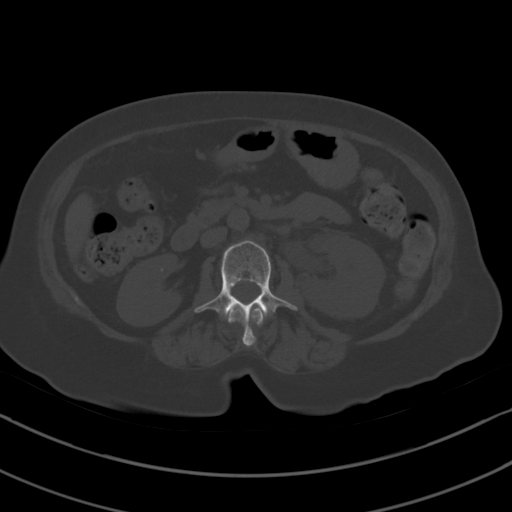
[im 58/82  soft-tissue]
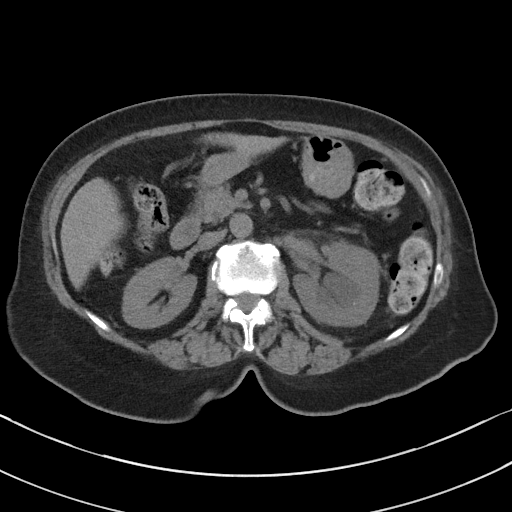
[im 65/82  soft-tissue]
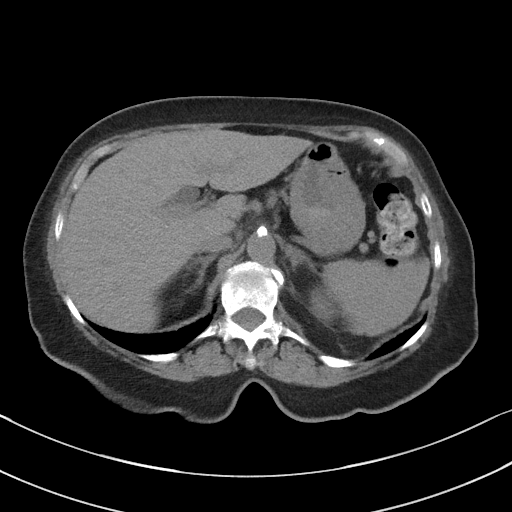
[im 71/82  soft-tissue]
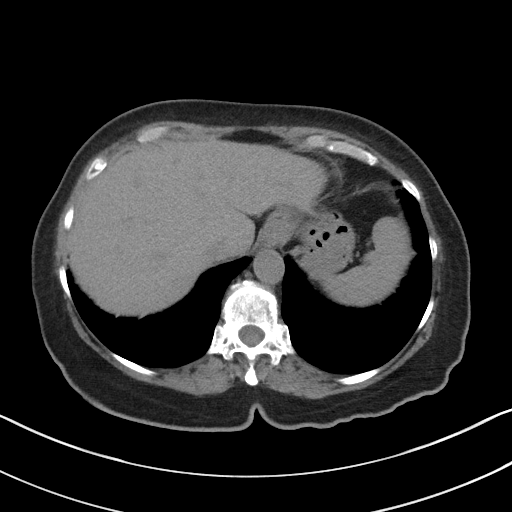
[im 78/82  soft-tissue]
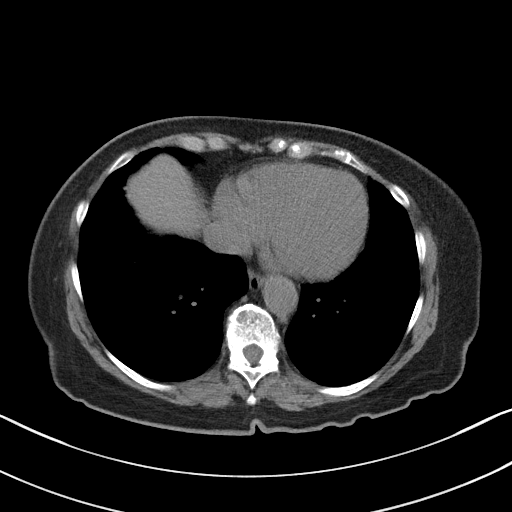

[Series 5: coronal st · coronal · 0.66mm/px · 3 of 88 slices shown]
[im 30/88  soft-tissue]
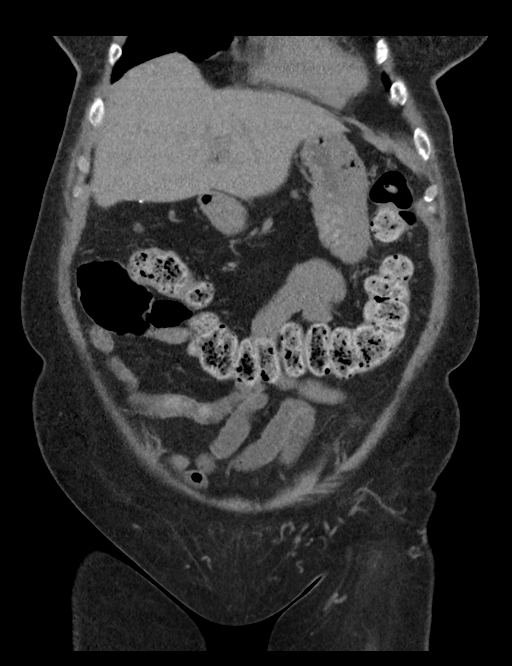
[im 39/88  soft-tissue]
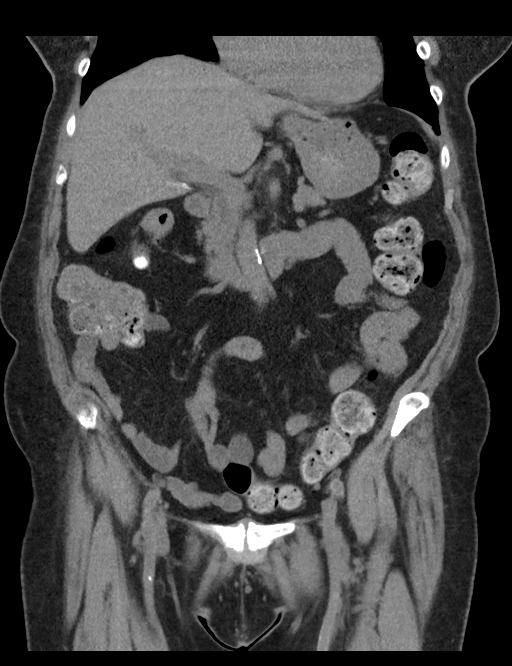
[im 49/88  soft-tissue]
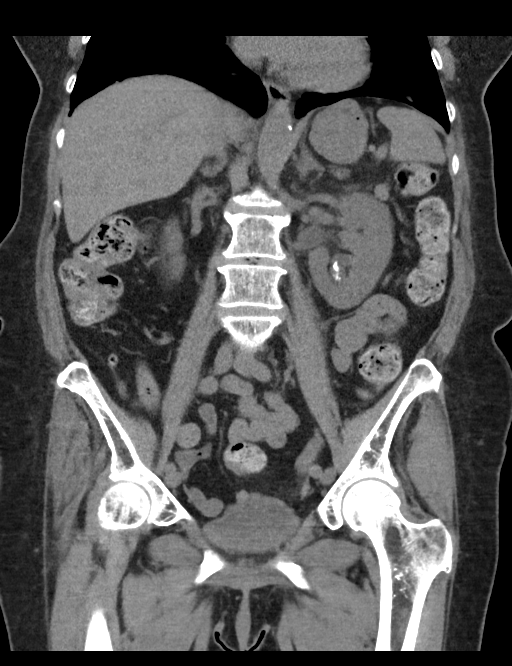

[16 of 46 positions shown; findings below may reference images not displayed]

FINDINGS: Lower chest: Clear lung bases. No significant pleural or pericardial
effusion.

Hepatobiliary: No focal hepatic abnormalities are identified on
noncontrast imaging. Mild extrahepatic biliary dilatation is within
physiologic limits post cholecystectomy. No calcified
choledocholithiasis.

Pancreas: Unremarkable. No pancreatic ductal dilatation or
surrounding inflammatory changes.

Spleen: Normal in size without focal abnormality.

Adrenals/Urinary Tract: Both adrenal glands appear normal. There is
bilateral nephrolithiasis, larger and more numerous on the left.
There is moderate left-sided hydronephrosis and proximal hydroureter
secondary to an obstructing calculus in the proximal left ureter.
This measures up to 8 mm in diameter and is visible on the scout
image at the L2-3 level. No right-sided hydronephrosis. The bladder
appears normal.

Stomach/Bowel: No evidence of bowel wall thickening, distention or
surrounding inflammatory change. There are diverticular changes
throughout the distal colon. The appendix appears normal.

Vascular/Lymphatic: There are no enlarged abdominal or pelvic lymph
nodes. Mild aortic and branch vessel atherosclerosis.

Reproductive: Hysterectomy. No adnexal mass. Probable pelvic floor
laxity.

Other: Small umbilical hernia containing only fat.  No ascites.

Musculoskeletal: No acute or significant osseous findings. There are
prominent facet degenerative changes within the lower lumbar spine.
Degenerative changes and subchondral sclerosis are present at the
sacroiliac joints and symphysis pubis as well.
IMPRESSION: 1. Obstructing 8 mm calculus in the proximal left ureter with
associated hydronephrosis.
2. Additional bilateral nephrolithiasis.
3. Additional incidental findings including distal colonic
diverticulosis and Aortic Atherosclerosis ([id number]).

## 2021-07-01 ENCOUNTER — Other Ambulatory Visit: Payer: Self-pay | Admitting: Family Medicine

## 2021-07-01 DIAGNOSIS — F418 Other specified anxiety disorders: Secondary | ICD-10-CM

## 2021-07-01 NOTE — Telephone Encounter (Signed)
Refill request on following meds: Pantoprazole 40 mg LR 02/22/21, #90, 1 rf LOV  11/10/20 FOV  none scheduled.    Paroxetine 20 mg LR 04/03/21, #90, 0 rf   Please review and advise.  Thanks   Dm/cma

## 2021-07-11 DIAGNOSIS — Z853 Personal history of malignant neoplasm of breast: Secondary | ICD-10-CM | POA: Diagnosis not present

## 2021-07-11 LAB — HM MAMMOGRAPHY

## 2021-07-14 DIAGNOSIS — M5417 Radiculopathy, lumbosacral region: Secondary | ICD-10-CM | POA: Diagnosis not present

## 2021-07-14 DIAGNOSIS — M9901 Segmental and somatic dysfunction of cervical region: Secondary | ICD-10-CM | POA: Diagnosis not present

## 2021-07-14 DIAGNOSIS — M5413 Radiculopathy, cervicothoracic region: Secondary | ICD-10-CM | POA: Diagnosis not present

## 2021-07-14 DIAGNOSIS — M9903 Segmental and somatic dysfunction of lumbar region: Secondary | ICD-10-CM | POA: Diagnosis not present

## 2021-07-26 DIAGNOSIS — R351 Nocturia: Secondary | ICD-10-CM | POA: Diagnosis not present

## 2021-07-26 DIAGNOSIS — R350 Frequency of micturition: Secondary | ICD-10-CM | POA: Diagnosis not present

## 2021-07-26 DIAGNOSIS — N8111 Cystocele, midline: Secondary | ICD-10-CM | POA: Diagnosis not present

## 2021-08-03 ENCOUNTER — Encounter: Payer: Self-pay | Admitting: Family Medicine

## 2021-08-03 DIAGNOSIS — M5417 Radiculopathy, lumbosacral region: Secondary | ICD-10-CM | POA: Diagnosis not present

## 2021-08-03 DIAGNOSIS — M5413 Radiculopathy, cervicothoracic region: Secondary | ICD-10-CM | POA: Diagnosis not present

## 2021-08-03 DIAGNOSIS — M9903 Segmental and somatic dysfunction of lumbar region: Secondary | ICD-10-CM | POA: Diagnosis not present

## 2021-08-03 DIAGNOSIS — M9901 Segmental and somatic dysfunction of cervical region: Secondary | ICD-10-CM | POA: Diagnosis not present

## 2021-08-12 DIAGNOSIS — M5415 Radiculopathy, thoracolumbar region: Secondary | ICD-10-CM | POA: Diagnosis not present

## 2021-08-12 DIAGNOSIS — M9901 Segmental and somatic dysfunction of cervical region: Secondary | ICD-10-CM | POA: Diagnosis not present

## 2021-08-12 DIAGNOSIS — M5451 Vertebrogenic low back pain: Secondary | ICD-10-CM | POA: Diagnosis not present

## 2021-08-12 DIAGNOSIS — M9903 Segmental and somatic dysfunction of lumbar region: Secondary | ICD-10-CM | POA: Diagnosis not present

## 2021-09-22 DIAGNOSIS — H903 Sensorineural hearing loss, bilateral: Secondary | ICD-10-CM | POA: Diagnosis not present

## 2021-09-28 ENCOUNTER — Other Ambulatory Visit: Payer: Medicare Other

## 2021-09-28 ENCOUNTER — Other Ambulatory Visit: Payer: Self-pay | Admitting: Allergy and Immunology

## 2021-09-28 ENCOUNTER — Ambulatory Visit
Admission: RE | Admit: 2021-09-28 | Discharge: 2021-09-28 | Disposition: A | Discharge status: Home / Self Care | Payer: Medicare Other | Source: Ambulatory Visit | Attending: Allergy and Immunology | Admitting: Allergy and Immunology

## 2021-09-28 DIAGNOSIS — R051 Acute cough: Secondary | ICD-10-CM

## 2021-09-28 DIAGNOSIS — J301 Allergic rhinitis due to pollen: Secondary | ICD-10-CM | POA: Diagnosis not present

## 2021-09-28 DIAGNOSIS — J3081 Allergic rhinitis due to animal (cat) (dog) hair and dander: Secondary | ICD-10-CM | POA: Diagnosis not present

## 2021-09-28 DIAGNOSIS — J3089 Other allergic rhinitis: Secondary | ICD-10-CM | POA: Diagnosis not present

## 2021-09-28 DIAGNOSIS — J4520 Mild intermittent asthma, uncomplicated: Secondary | ICD-10-CM | POA: Diagnosis not present

## 2021-09-28 DIAGNOSIS — R059 Cough, unspecified: Secondary | ICD-10-CM | POA: Diagnosis not present

## 2021-09-29 ENCOUNTER — Other Ambulatory Visit: Payer: Self-pay | Admitting: Family Medicine

## 2021-09-29 DIAGNOSIS — F418 Other specified anxiety disorders: Secondary | ICD-10-CM

## 2021-10-24 ENCOUNTER — Telehealth: Payer: Self-pay | Admitting: Family Medicine

## 2021-10-24 NOTE — Telephone Encounter (Signed)
Left message for patient to call back and schedule Medicare Annual Wellness Visit (AWV) in office.  ? ?If not able to come in office, please offer to do virtually or by telephone.  Left office number and my jabber [phone]. ? ?Last AWV:10/19/2020 ? ?Please schedule at anytime with Nurse Health Advisor. ?  ?

## 2021-11-17 DIAGNOSIS — M5417 Radiculopathy, lumbosacral region: Secondary | ICD-10-CM | POA: Diagnosis not present

## 2021-11-17 DIAGNOSIS — M9901 Segmental and somatic dysfunction of cervical region: Secondary | ICD-10-CM | POA: Diagnosis not present

## 2021-11-17 DIAGNOSIS — M9903 Segmental and somatic dysfunction of lumbar region: Secondary | ICD-10-CM | POA: Diagnosis not present

## 2021-11-17 DIAGNOSIS — M5413 Radiculopathy, cervicothoracic region: Secondary | ICD-10-CM | POA: Diagnosis not present

## 2021-11-23 ENCOUNTER — Ambulatory Visit (INDEPENDENT_AMBULATORY_CARE_PROVIDER_SITE_OTHER): Payer: Medicare Other

## 2021-11-23 DIAGNOSIS — Z0000 Encounter for general adult medical examination without abnormal findings: Secondary | ICD-10-CM | POA: Diagnosis not present

## 2021-11-23 NOTE — Progress Notes (Signed)
? ?Subjective:  ? Brenda Delgado is a 76 y.o. female who presents for Medicare Annual (Subsequent) preventive examination. ? ?I connected with Narda Amber today by telephone and verified that I am speaking with the correct person using two identifiers. ?Location patient: home ?Location provider: work ?Persons participating in the virtual visit: patient, provider. ?  ?I discussed the limitations, risks, security and privacy concerns of performing an evaluation and management service by telephone and the availability of in person appointments. I also discussed with the patient that there may be a patient responsible charge related to this service. The patient expressed understanding and verbally consented to this telephonic visit.  ?  ?Interactive audio and video telecommunications were attempted between this provider and patient, however failed, due to patient having technical difficulties OR patient did not have access to video capability.  We continued and completed visit with audio only. ? ?  ?Review of Systems    ? ?Cardiac Risk Factors include: advanced age (>40mn, >>61women);dyslipidemia;female gender ? ?   ?Objective:  ?  ?Today's Vitals  ? ?There is no height or weight on file to calculate BMI. ? ? ?  11/23/2021  ?  1:18 PM 03/24/2021  ?  8:00 PM 10/19/2020  ?  9:52 AM 02/04/2020  ?  8:46 AM 01/12/2020  ?  9:03 PM 11/07/2019  ?  8:58 AM 09/02/2019  ?  9:50 AM  ?Advanced Directives  ?Does Patient Have a Medical Advance Directive? Yes No Yes Yes No No Yes  ?Type of AParamedicof [place]unburstLiving will  [place]Living will [place]alesvilleLiving will   HCumingsLiving will  ?Copy of [place]? No - copy requested  No - copy requested    No - copy requested  ?Would patient like information on creating a medical advance directive?  No - Patient declined   No - Patient declined No - Patient declined No - Patient declined   ? ? ?Current Medications (verified) ?Outpatient Encounter Medications as of 11/23/2021  ?Medication Sig  ? ALBUTEROL IN Inhale into the lungs as needed.  ? Ascorbic Acid (VITAMIN C) 1000 MG tablet Take 1,000 mg by mouth daily.  ? aspirin EC 81 MG tablet Take 81 mg by mouth daily.  ? Calcium-Vitamin D-Vitamin K 500-500-40 MG-UNT-MCG CHEW Chew 2 tablets by mouth daily.  ? Cholecalciferol (VITAMIN D3) 50 MCG (2000 UT) TABS Take 50 mcg by mouth 2 (two) times daily.  ? Coenzyme Q10 (COQ10 PO) Take 1 capsule by mouth daily.  ? EPINEPHrine 0.3 mg/0.3 mL IJ SOAJ injection SMARTSIG:1 Pre-Filled Pen Syringe IM As Needed  ? omega-3 acid ethyl esters (LOVAZA) 1 g capsule Take by mouth 2 (two) times daily.  ? pantoprazole (PROTONIX) 40 MG tablet TAKE 1 TABLET BY MOUTH ONCE DAILY AT BEDTIME  ? PARoxetine (PAXIL) 20 MG tablet Take 1 tablet by mouth once daily  ? tamoxifen (NOLVADEX) 10 MG tablet Take 1 tablet (10 mg total) by mouth daily.  ? zinc gluconate 50 MG tablet Take 50 mg by mouth daily.  ? Multiple Vitamins-Minerals (PRESERVISION AREDS 2) CAPS Take 2 capsules by mouth daily. (Patient not taking: Reported on 11/23/2021)  ? ?No facility-administered encounter medications on file as of 11/23/2021.  ? ? ?Allergies (verified) ?Pravastatin, Erythromycin, Iohexol, Amoxicillin-pot clavulanate, Codeine, Moxifloxacin, and Sulfamethoxazole  ? ?History: ?Past Medical History:  ?Diagnosis Date  ? Allergy   ? Anxiety   ? Arthritis   ?  right knee  ? Cancer ([place]) 07/2019  ? left breast DCIS  ? Complication of anesthesia   ? per pt with surgery 05-14-2019 right facial swelling and lip/ mouth blister's  ? Constipation   ? Depression   ? Environmental and seasonal allergies   ? Family history of colon cancer   ? Family history of ovarian cancer   ? GERD (gastroesophageal reflux disease)   ? Hiatal hernia   ? History of kidney stones   ? Left ureteral stone   ? Mild asthma   ? Nephrolithiasis   ? Nocturia   ? PONV (postoperative nausea and  vomiting)   ? Pre-diabetes   ? Renal calculus, left   ? Wears glasses   ? Wears hearing aid in both ears   ? ?Past Surgical History:  ?Procedure Laterality Date  ? ABDOMINAL HYSTERECTOMY    ? ANTERIOR CERVICAL DECOMP/DISCECTOMY FUSION  01-06-2010   '@[facility]'$   ? C4--5  ? BREAST LUMPECTOMY WITH RADIOACTIVE SEED LOCALIZATION Left 08/12/2019  ? Procedure: LEFT BREAST LUMPECTOMY WITH RADIOACTIVE SEED LOCALIZATION;  Surgeon: Alphonsa Overall, MD;  Location: [facility];  Service: General;  Laterality: Left;  ? CARPAL TUNNEL RELEASE Right 2000  ? cataracts  2018  ? bilateral  ? CYSTOCELE REPAIR  09/11/2011  ? Procedure: ANTERIOR REPAIR (CYSTOCELE);  Surgeon: Olga Millers, MD;  Location: [facility];  Service: Gynecology;  Laterality: N/A;  ? CYSTOSCOPY  09/11/2011  ? Procedure: CYSTOSCOPY;  Surgeon: Olga Millers, MD;  Location: [facility];  Service: Gynecology;  Laterality: N/A;  ? CYSTOSCOPY WITH RETROGRADE PYELOGRAM, URETEROSCOPY AND STENT PLACEMENT Left 05/28/2019  ? Procedure: CYSTOSCOPY WITH RETROGRADE PYELOGRAM, URETEROSCOPY AND STENT REPLACEMENT;  Surgeon: Alexis Frock, MD;  Location: [facility];  Service: Urology;  Laterality: Left;  ? CYSTOSCOPY WITH STENT PLACEMENT Left 05/14/2019  ? Procedure: CYSTOSCOPY WITH STENT PLACEMENT, RETROGRADE;  Surgeon: Bjorn Loser, MD;  Location: [facility];  Service: Urology;  Laterality: Left;  ? ELBOW SURGERY Right   ? EXCISION VULVA CYST Bilateral 01-16-2005  '@[facility]'$   ? HOLMIUM LASER APPLICATION Left 83/15/1761  ? Procedure: HOLMIUM LASER APPLICATION;  Surgeon: Alexis Frock, MD;  Location: [facility];  Service: Urology;  Laterality: Left;  ? KNEE ARTHROSCOPY Right 2005;    02-11-2013  ? KNEE SURGERY Right 08-20-2012   '@[facility]'$   ? peroneal nerve neurolysis proximal tibia/ fibular articular branch   ? LAPAROSCOPIC CHOLECYSTECTOMY  1990s  ? SACROSPINOUS LIGAMENT FIXATION  04-05-2017   '@[facility]'$   ? UPHOLD vaginal mesh and  Anterior Repair  ?  SALPINGOOPHORECTOMY  09/11/2011  ? Procedure: SALPINGO OOPHERECTOMY;  Surgeon: Olga Millers, MD;  Location: [facility];  Service: Gynecology;  Laterality: Bilateral;  ? SHOULDER SURGERY Right   ? TONSILLECTOMY    ? VAGINAL HYSTERECTOMY  09/11/2011  ? Procedure: HYSTERECTOMY VAGINAL;  Surgeon: Olga Millers, MD;  Location: [facility];  Service: Gynecology;  Laterality: N/A;  ? VULVA /PERINEUM BIOPSY  09/11/2011  ? Procedure: VULVAR BIOPSY;  Surgeon: Olga Millers, MD;  Location: [facility];  Service: Gynecology;  Laterality: Left;  sebaceous cyst excision left vula  ? ?Family History  ?Problem Relation Age of Onset  ? Skin cancer Mother   ? Colon cancer Paternal Uncle   ?     dx late 3s-60s  ? Ovarian cancer Paternal Aunt 81  ? Cancer Cousin   ?     unk type  ? ?Social History  ? ?Socioeconomic History  ?  Marital status: Married  ?  Spouse name: Not on file  ? Number of children: Not on file  ? Years of education: Not on file  ? Highest education level: Not on file  ?Occupational History  ? Not on file  ?Tobacco Use  ? Smoking status: Never  ? Smokeless tobacco: Never  ?Vaping Use  ? Vaping Use: Never used  ?Substance and Sexual Activity  ? Alcohol use: Yes  ?  Comment: occasional  ? Drug use: Never  ? Sexual activity: Not on file  ?Other Topics Concern  ? Not on file  ?Social History Narrative  ? Right handed  ? ?Social Determinants of Health  ? ?Financial Resource Strain: Low Risk   ? Difficulty of Paying Living Expenses: Not hard at all  ?Food Insecurity: No Food Insecurity  ? Worried About Charity fundraiser in the Last Year: Never true  ? Ran Out of Food in the Last Year: Never true  ?Transportation Needs: No Transportation Needs  ? Lack of Transportation (Medical): No  ? Lack of Transportation (Non-Medical): No  ?Physical Activity: Sufficiently Active  ? Days of Exercise per Week: 5 days  ? Minutes of Exercise per Session: 30 min  ?Stress: No Stress Concern Present  ? Feeling of Stress : Not at all  ?Social Connections:  Moderately Integrated  ? Frequency of Communication with Friends and Family: Three times a week  ? Frequency of Social Gatherings with Friends and Family: Three times a week  ? Attends Religious Services: More than 4

## 2021-11-23 NOTE — Patient Instructions (Signed)
[name] , ?Thank you for taking time to come for your Medicare Wellness Visit. I appreciate your ongoing commitment to your health goals. Please review the following plan we discussed and let me know if I can assist you in the future.  ? ?Screening recommendations/referrals: ?Colonoscopy: no longer required  ?Mammogram: no longer required  ?Bone Density: 02/16/2021 ?Recommended yearly ophthalmology/optometry visit for glaucoma screening and checkup ?Recommended yearly dental visit for hygiene and checkup ? ?Vaccinations: ?Influenza vaccine: completed  ?Pneumococcal vaccine: completed  ?Tdap vaccine: due  ?Shingles vaccine: completed    ? ?Advanced directives: yes  ? ?Conditions/risks identified: none  ? ?Next appointment: none  ? ? ?Preventive Care 75 Years and Older, Female ?Preventive care refers to lifestyle choices and visits with your health care provider that can promote health and wellness. ?What does preventive care include? ?A yearly physical exam. This is also called an annual well check. ?Dental exams once or twice a year. ?Routine eye exams. Ask your health care provider how often you should have your eyes checked. ?Personal lifestyle choices, including: ?Daily care of your teeth and gums. ?Regular physical activity. ?Eating a healthy diet. ?Avoiding tobacco and drug use. ?Limiting alcohol use. ?Practicing safe sex. ?Taking low-dose aspirin every day. ?Taking vitamin and mineral supplements as recommended by your health care provider. ?What happens during an annual well check? ?The services and screenings done by your health care provider during your annual well check will depend on your age, overall health, lifestyle risk factors, and family history of disease. ?Counseling  ?Your health care provider may ask you questions about your: ?Alcohol use. ?Tobacco use. ?Drug use. ?Emotional well-being. ?Home and relationship well-being. ?Sexual activity. ?Eating habits. ?History of falls. ?Memory and ability  to understand (cognition). ?Work and work Statistician. ?Reproductive health. ?Screening  ?You may have the following tests or measurements: ?Height, weight, and BMI. ?Blood pressure. ?Lipid and cholesterol levels. These may be checked every 5 years, or more frequently if you are over 35 years old. ?Skin check. ?Lung cancer screening. You may have this screening every year starting at age 24 if you have a 30-pack-year history of smoking and currently smoke or have quit within the past 15 years. ?Fecal occult blood test (FOBT) of the stool. You may have this test every year starting at age 74. ?Flexible sigmoidoscopy or colonoscopy. You may have a sigmoidoscopy every 5 years or a colonoscopy every 10 years starting at age 75. ?Hepatitis C blood test. ?Hepatitis B blood test. ?Sexually transmitted disease (STD) testing. ?Diabetes screening. This is done by checking your blood sugar (glucose) after you have not eaten for a while (fasting). You may have this done every 1-3 years. ?Bone density scan. This is done to screen for osteoporosis. You may have this done starting at age 75. ?Mammogram. This may be done every 1-2 years. Talk to your health care provider about how often you should have regular mammograms. ?Talk with your health care provider about your test results, treatment options, and if necessary, the need for more tests. ?Vaccines  ?Your health care provider may recommend certain vaccines, such as: ?Influenza vaccine. This is recommended every year. ?Tetanus, diphtheria, and acellular pertussis (Tdap, Td) vaccine. You may need a Td booster every 10 years. ?Zoster vaccine. You may need this after age 48. ?Pneumococcal 13-valent conjugate (PCV13) vaccine. One dose is recommended after age 75. ?Pneumococcal polysaccharide (PPSV23) vaccine. One dose is recommended after age 75. ?Talk to your health care provider about which screenings and  vaccines you need and how often you need them. ?This information is not  intended to replace advice given to you by your health care provider. Make sure you discuss any questions you have with your health care provider. ?Document Released: 08/20/2015 Document Revised: 04/12/2016 Document Reviewed: 05/25/2015 ?Elsevier Interactive Patient Education ? 2017 [place]. ? ?Fall Prevention in the Home ?Falls can cause injuries. They can happen to people of all ages. There are many things you can do to make your home safe and to help prevent falls. ?What can I do on the outside of my home? ?Regularly fix the edges of walkways and driveways and fix any cracks. ?Remove anything that might make you trip as you walk through a door, such as a raised step or threshold. ?Trim any bushes or trees on the path to your home. ?Use bright outdoor lighting. ?Clear any walking paths of anything that might make someone trip, such as rocks or tools. ?Regularly check to see if handrails are loose or broken. Make sure that both sides of any steps have handrails. ?Any raised decks and porches should have guardrails on the edges. ?Have any leaves, snow, or ice cleared regularly. ?Use sand or salt on walking paths during winter. ?Clean up any spills in your garage right away. This includes oil or grease spills. ?What can I do in the bathroom? ?Use night lights. ?Install grab bars by the toilet and in the tub and shower. Do not use towel bars as grab bars. ?Use non-skid mats or decals in the tub or shower. ?If you need to sit down in the shower, use a plastic, non-slip stool. ?Keep the floor dry. Clean up any water that spills on the floor as soon as it happens. ?Remove soap buildup in the tub or shower regularly. ?Attach bath mats securely with double-sided non-slip rug tape. ?Do not have throw rugs and other things on the floor that can make you trip. ?What can I do in the bedroom? ?Use night lights. ?Make sure that you have a light by your bed that is easy to reach. ?Do not use any sheets or blankets that are  too big for your bed. They should not hang down onto the floor. ?Have a firm chair that has side arms. You can use this for support while you get dressed. ?Do not have throw rugs and other things on the floor that can make you trip. ?What can I do in the kitchen? ?Clean up any spills right away. ?Avoid walking on wet floors. ?Keep items that you use a lot in easy-to-reach places. ?If you need to reach something above you, use a strong step stool that has a grab bar. ?Keep electrical cords out of the way. ?Do not use floor polish or wax that makes floors slippery. If you must use wax, use non-skid floor wax. ?Do not have throw rugs and other things on the floor that can make you trip. ?What can I do with my stairs? ?Do not leave any items on the stairs. ?Make sure that there are handrails on both sides of the stairs and use them. Fix handrails that are broken or loose. Make sure that handrails are as long as the stairways. ?Check any carpeting to make sure that it is firmly attached to the stairs. Fix any carpet that is loose or worn. ?Avoid having throw rugs at the top or bottom of the stairs. If you do have throw rugs, attach them to the floor with carpet tape. ?Make  sure that you have a light switch at the top of the stairs and the bottom of the stairs. If you do not have them, ask someone to add them for you. ?What else can I do to help prevent falls? ?Wear shoes that: ?Do not have high heels. ?Have rubber bottoms. ?Are comfortable and fit you well. ?Are closed at the toe. Do not wear sandals. ?If you use a stepladder: ?Make sure that it is fully opened. Do not climb a closed stepladder. ?Make sure that both sides of the stepladder are locked into place. ?Ask someone to hold it for you, if possible. ?Clearly mark and make sure that you can see: ?Any grab bars or handrails. ?First and last steps. ?Where the edge of each step is. ?Use tools that help you move around (mobility aids) if they are needed. These  include: ?Canes. ?Walkers. ?Scooters. ?Crutches. ?Turn on the lights when you go into a dark area. Replace any light bulbs as soon as they burn out. ?Set up your furniture so you have a clear path. Avoid moving

## 2021-12-19 ENCOUNTER — Telehealth: Payer: Self-pay | Admitting: Hematology and Oncology

## 2021-12-19 NOTE — Telephone Encounter (Signed)
Rescheduled appointment per provider PAL. Patient is aware of the changes made to her upcoming appointment. 

## 2021-12-28 ENCOUNTER — Other Ambulatory Visit: Payer: Self-pay | Admitting: Hematology and Oncology

## 2022-01-05 ENCOUNTER — Other Ambulatory Visit: Payer: Self-pay

## 2022-01-05 ENCOUNTER — Inpatient Hospital Stay: Payer: Medicare Other | Attending: Hematology and Oncology | Admitting: Hematology and Oncology

## 2022-01-05 DIAGNOSIS — M9903 Segmental and somatic dysfunction of lumbar region: Secondary | ICD-10-CM | POA: Diagnosis not present

## 2022-01-05 DIAGNOSIS — D0512 Intraductal carcinoma in situ of left breast: Secondary | ICD-10-CM | POA: Diagnosis not present

## 2022-01-05 DIAGNOSIS — M5417 Radiculopathy, lumbosacral region: Secondary | ICD-10-CM | POA: Diagnosis not present

## 2022-01-05 DIAGNOSIS — Z79810 Long term (current) use of selective estrogen receptor modulators (SERMs): Secondary | ICD-10-CM | POA: Diagnosis not present

## 2022-01-05 DIAGNOSIS — Z923 Personal history of irradiation: Secondary | ICD-10-CM | POA: Diagnosis not present

## 2022-01-05 DIAGNOSIS — M5413 Radiculopathy, cervicothoracic region: Secondary | ICD-10-CM | POA: Diagnosis not present

## 2022-01-05 DIAGNOSIS — M9901 Segmental and somatic dysfunction of cervical region: Secondary | ICD-10-CM | POA: Diagnosis not present

## 2022-01-05 DIAGNOSIS — Z170 Estrogen receptor positive status [ER+]: Secondary | ICD-10-CM | POA: Diagnosis not present

## 2022-01-05 MED ORDER — TAMOXIFEN CITRATE 10 MG PO TABS: 10.0000 mg | ORAL_TABLET | Freq: Every day | ORAL | 4 refills | Status: DC

## 2022-01-05 NOTE — Assessment & Plan Note (Addendum)
07/28/2019:Routine screening mammogram detected a 1.9cm group of calcifications in the left breast. Biopsy showed DCIS, high grade, ER+ 95%, PR+ 90%. Tis NX stage 0 08/12/2019: Left lumpectomy: High-grade DCIS with necrosis and calcifications 1.5 cm, margins negative, ER 95%, PR 90%  Treatment plan: 1.Adjuvant radiation therapy2/11/2019-10/06/2019 2.Follow-up adjuvant antiestrogen therapy with tamoxifen x5 years started 10/19/20 ------------------------------------------------------------------------------------------------------------------------ Tamoxifen Toxicities: Tolerating it extremely well without any problems or concerns Denies any hot flashes or arthralgias or myalgias.  Multiple drainage procedures for seroma: Resolved  Breast Cancer Surveillance: 1. Breast Exam: 01/05/22: Benign 2. Mammogram: 07/11/2021: [facility]  I renewed her prescription for tamoxifen for another year. Return to clinic in 1 year for follow-up

## 2022-01-05 NOTE — Progress Notes (Signed)
Patient Care Team: Libby Maw, MD as PCP - General (Family Medicine) Mauro Kaufmann, RN as Oncology Nurse Navigator Rockwell Germany, RN as Oncology Nurse Navigator Alphonsa Overall, MD as Consulting Physician (General Surgery) Nicholas Lose, MD as Consulting Physician (Hematology and Oncology) Eppie Gibson, MD as Attending Physician (Radiation Oncology)  DIAGNOSIS:  Encounter Diagnosis  Name Primary?   Ductal carcinoma in situ (DCIS) of left breast     SUMMARY OF ONCOLOGIC HISTORY: Oncology History  Ductal carcinoma in situ (DCIS) of left breast  07/28/2019 Initial Diagnosis   Routine screening mammogram detected a 1.9cm group of calcifications in the left breast. Biopsy showed DCIS, high grade, ER+ 95%, PR+ 90%.   07/30/2019 Cancer Staging   Staging form: Breast, AJCC 8th Edition - Clinical: Stage 0 (cTis (DCIS), cN0, cM0, G3, ER+, PR+, HER2: Not Assessed)    08/12/2019 Genetic Testing   Negative genetic testing. No pathogenic variants identified, VUS in MSH2 Gain (Exons 11-16) identified on the [facility] Common Hereditary Cancers Panel + Breast Cancer STAT Panel. The STAT Breast cancer panel offered by [facility] includes sequencing and rearrangement analysis for the following 9 genes:  ATM, BRCA1, BRCA2, CDH1, CHEK2, PALB2, PTEN, STK11 and TP53.  The Common Hereditary Cancers Panel offered by [facility] includes sequencing and/or deletion duplication testing of the following 47 genes: APC, ATM, AXIN2, BARD1, BMPR1A, BRCA1, BRCA2, BRIP1, CDH1, CDKN2A (p14ARF), CDKN2A (p16INK4a), CKD4, CHEK2, CTNNA1, DICER1, EPCAM (Deletion/duplication testing only), GREM1 (promoter region deletion/duplication testing only), KIT, MEN1, MLH1, MSH2, MSH3, MSH6, MUTYH, NBN, NF1, NHTL1, PALB2, PDGFRA, PMS2, POLD1, POLE, PTEN, RAD50, RAD51C, RAD51D, SDHB, SDHC, SDHD, SMAD4, SMARCA4. STK11, TP53, TSC1, TSC2, and VHL.  The following genes were evaluated for sequence changes only: SDHA and HOXB13 c.251G>A  variant only. The report date is 08/12/2019.   08/12/2019 Cancer Staging   Staging form: Breast, AJCC 8th Edition - Pathologic stage from 08/12/2019: Stage 0 (pTis (DCIS), pN0, cM0, ER+, PR+)   08/12/2019 Surgery   Left Breast Lumpectomy [name]) [phone]): high grade DCIS with calcifications and necrosis, spanning 1.5cm. Additional left lateral, medial, and superior magrins showed fibrocystic changes. No regional lymph nodes were examined. ER+ PR+.   09/10/2019 - 10/07/2019 Radiation Therapy   The patient initially received a dose of 40.05 Gy in 15 fractions to the breast using whole-breast tangent fields. This was delivered using a 3-D conformal technique. The pt received a boost delivering an additional 10 Gy in 5 fractions using a electron boost with 109mV electrons. The total dose was 50.05 Gy.   10/2019 - 10/2024 Anti-estrogen oral therapy   Tamoxifen     CHIEF COMPLIANT: Follow-up of left breast DCIS on tamoxifen     INTERVAL HISTORY: Brenda Delgado a 76y.o. with above-mentioned history of left breast DCIS for which she underwent a lumpectomy, radiation, and is currently on antiestrogen therapy with tamoxifen. She presents to the clinic today for follow-up. States that sometimes she has some discomfort in breast. States that she tolerating the Tamoxifen. Denies hot flashes joints and aches and pain.   ALLERGIES:  is allergic to pravastatin, erythromycin, iohexol, amoxicillin-pot clavulanate, codeine, moxifloxacin, and sulfamethoxazole.  MEDICATIONS:  Current Outpatient Medications  Medication Sig Dispense Refill   ALBUTEROL IN Inhale into the lungs as needed.     Ascorbic Acid (VITAMIN C) 1000 MG tablet Take 1,000 mg by mouth daily.     aspirin EC 81 MG tablet Take 81 mg by mouth daily.     Azelastine HCl  0.15 % SOLN 2 sprays in each nostril     Calcium-Vitamin D-Vitamin K 500-500-40 MG-UNT-MCG CHEW Chew 2 tablets by mouth daily.     Cholecalciferol (VITAMIN D3) 50 MCG (2000 UT)  TABS Take 50 mcg by mouth 2 (two) times daily.     Coenzyme Q10 (COQ10 PO) Take 1 capsule by mouth daily.     EPINEPHrine 0.3 mg/0.3 mL IJ SOAJ injection SMARTSIG:1 Pre-Filled Pen Syringe IM As Needed     FLOVENT HFA 110 MCG/ACT inhaler SMARTSIG:2 Puff(s) By Mouth Twice Daily     Multiple Vitamins-Minerals (PRESERVISION AREDS 2) CAPS Take 2 capsules by mouth daily.     omega-3 acid ethyl esters (LOVAZA) 1 g capsule Take by mouth 2 (two) times daily.     pantoprazole (PROTONIX) 40 MG tablet TAKE 1 TABLET BY MOUTH ONCE DAILY AT BEDTIME 90 tablet 0   PARoxetine (PAXIL) 20 MG tablet Take 1 tablet by mouth once daily 90 tablet 0   zinc gluconate 50 MG tablet Take 50 mg by mouth daily.     tamoxifen (NOLVADEX) 10 MG tablet Take 1 tablet (10 mg total) by mouth daily. 90 tablet 4   No current facility-administered medications for this visit.    PHYSICAL EXAMINATION: ECOG PERFORMANCE STATUS: 1 - Symptomatic but completely ambulatory  Vitals:   01/05/22 1035  BP: 136/80  Pulse: 74  Resp: 16  Temp: 97.7 F (36.5 C)  SpO2: 95%   Filed Weights   01/05/22 1035  Weight: 129 lb 9.6 oz (58.8 kg)    BREAST: No palpable masses or nodules in either right or left breasts.  Scar tissue in the surgical site of the left breast.  No palpable axillary supraclavicular or infraclavicular adenopathy no breast tenderness or nipple discharge. (exam performed in the presence of a chaperone)  LABORATORY DATA:  I have reviewed the data as listed    Latest Ref Rng & Units 11/10/2020    9:33 AM 02/02/2020    3:21 PM 07/30/2019    8:44 AM  CMP  Glucose 70 - 99 mg/dL 94   85   97    BUN 6 - 23 mg/dL _0 Creatinine 0.40 - 1.20 mg/dL 0.64   0.67   0.67    Sodium 135 - 145 mEq/L 140   141   143    Potassium 3.5 - 5.1 mEq/L 4.4   4.8   4.4    Chloride 96 - 112 mEq/L 103   103   104    CO2 19 - 32 mEq/L 32   32   30    Calcium 8.4 - 10.5 mg/dL 9.1   9.2   8.9    Total Protein 6.0 - 8.3 g/dL 6.2   6.5    6.5    Total Bilirubin 0.2 - 1.2 mg/dL 0.5   0.3   0.4    Alkaline Phos 39 - 117 U/L 51   49   72    AST 0 - 37 U/L _1 ALT 0 - 35 U/L _2 Lab Results  Component Value Date   WBC 2.4 Repeated and verified X2. (L) 11/10/2020   HGB 14.3 11/10/2020   HCT 43.0 11/10/2020   MCV 91.7 11/10/2020   PLT 250.0 11/10/2020   NEUTROABS 1.7 07/30/2019    ASSESSMENT & PLAN:  Ductal carcinoma in situ (DCIS) of left breast 07/28/2019:Routine screening mammogram detected a 1.9cm group of calcifications in the left breast. Biopsy showed DCIS, high grade, ER+ 95%, PR+ 90%. Tis NX stage 0 08/12/2019: Left lumpectomy: High-grade DCIS with necrosis and calcifications 1.5 cm, margins negative, ER 95%, PR 90%   Treatment plan: 1.  Adjuvant radiation therapy 09/11/2019-10/06/2019 2.  Follow-up adjuvant antiestrogen therapy with tamoxifen x5 years started 10/19/20 ------------------------------------------------------------------------------------------------------------------------ Tamoxifen Toxicities: Tolerating it extremely well without any problems or concerns Denies any hot flashes or arthralgias or myalgias.   Multiple drainage procedures for seroma: Resolved   Breast Cancer Surveillance: 1. Breast Exam: 01/05/22: Benign 2. Mammogram: 07/11/2021: [facility]   I renewed her prescription for tamoxifen for another year. Return to clinic in 1 year for follow-up   No orders of the defined types were placed in this encounter.  The patient has a good understanding of the overall plan. she agrees with it. she will call with any problems that may develop before the next visit here. Total time spent: 30 mins including face to face time and time spent for planning, charting and co-ordination of care   Harriette Ohara, MD 01/05/22    I Gardiner Coins am scribing for Dr. Lindi Adie  I have reviewed the above documentation for accuracy and completeness, and I agree with the  above.

## 2022-01-11 ENCOUNTER — Ambulatory Visit: Payer: Medicare Other | Admitting: Hematology and Oncology

## 2022-02-02 DIAGNOSIS — M5413 Radiculopathy, cervicothoracic region: Secondary | ICD-10-CM | POA: Diagnosis not present

## 2022-02-02 DIAGNOSIS — M5417 Radiculopathy, lumbosacral region: Secondary | ICD-10-CM | POA: Diagnosis not present

## 2022-02-02 DIAGNOSIS — M9901 Segmental and somatic dysfunction of cervical region: Secondary | ICD-10-CM | POA: Diagnosis not present

## 2022-02-02 DIAGNOSIS — M9903 Segmental and somatic dysfunction of lumbar region: Secondary | ICD-10-CM | POA: Diagnosis not present

## 2022-02-18 ENCOUNTER — Other Ambulatory Visit: Payer: Self-pay | Admitting: Family Medicine

## 2022-02-23 DIAGNOSIS — M9903 Segmental and somatic dysfunction of lumbar region: Secondary | ICD-10-CM | POA: Diagnosis not present

## 2022-02-23 DIAGNOSIS — M9901 Segmental and somatic dysfunction of cervical region: Secondary | ICD-10-CM | POA: Diagnosis not present

## 2022-02-23 DIAGNOSIS — M5417 Radiculopathy, lumbosacral region: Secondary | ICD-10-CM | POA: Diagnosis not present

## 2022-02-23 DIAGNOSIS — M5413 Radiculopathy, cervicothoracic region: Secondary | ICD-10-CM | POA: Diagnosis not present

## 2022-03-03 ENCOUNTER — Other Ambulatory Visit: Payer: Self-pay | Admitting: Family Medicine

## 2022-03-03 ENCOUNTER — Ambulatory Visit (INDEPENDENT_AMBULATORY_CARE_PROVIDER_SITE_OTHER): Payer: Medicare Other | Admitting: Nurse Practitioner

## 2022-03-03 ENCOUNTER — Encounter: Payer: Self-pay | Admitting: Nurse Practitioner

## 2022-03-03 VITALS — BP 116/76 | HR 72 | Temp 97.8°F | Ht 59.0 in | Wt 130.4 lb

## 2022-03-03 DIAGNOSIS — R300 Dysuria: Secondary | ICD-10-CM | POA: Diagnosis not present

## 2022-03-03 DIAGNOSIS — N3000 Acute cystitis without hematuria: Secondary | ICD-10-CM | POA: Diagnosis not present

## 2022-03-03 DIAGNOSIS — F418 Other specified anxiety disorders: Secondary | ICD-10-CM

## 2022-03-03 DIAGNOSIS — E7800 Pure hypercholesterolemia, unspecified: Secondary | ICD-10-CM

## 2022-03-03 DIAGNOSIS — Z8639 Personal history of other endocrine, nutritional and metabolic disease: Secondary | ICD-10-CM | POA: Diagnosis not present

## 2022-03-03 LAB — POCT URINALYSIS DIPSTICK
Blood, UA: 10
Glucose, UA: NEGATIVE
Ketones, UA: NEGATIVE
Nitrite, UA: POSITIVE
Protein, UA: POSITIVE — AB
Spec Grav, UA: 1.025 (ref 1.010–1.025)
Urobilinogen, UA: 0.2 E.U./dL
pH, UA: 5.5 (ref 5.0–8.0)

## 2022-03-03 MED ORDER — FOSFOMYCIN TROMETHAMINE 3 G PO PACK: 3.0000 g | PACK | Freq: Once | ORAL | 0 refills | Status: AC

## 2022-03-03 NOTE — Patient Instructions (Signed)
Take forfomysin as prescribe Urine sent for culture Maintain adequate oral hydration. Schedule lab appt for fasting blood draw

## 2022-03-03 NOTE — Progress Notes (Signed)
Acute Office Visit  Subjective:    Patient ID: Brenda Delgado, female    DOB: 1945/09/25, 76 y.o.   MRN: [id number]  Chief Complaint  Patient presents with   Acute Visit    Possible UTI Pt would like to get LDL & A1C checked  No other concerns     Urinary Tract Infection  This is a new problem. The current episode started in the past 7 days. The problem occurs every urination. The problem has been unchanged. The quality of the pain is described as burning. There has been no fever. She is Not sexually active. There is No history of pyelonephritis. Associated symptoms include frequency. Pertinent negatives include no chills, discharge, flank pain, hematuria, hesitancy, nausea, possible pregnancy, sweats, urgency or vomiting. She has tried nothing for the symptoms. Her past medical history is significant for urinary stasis. There is no history of catheterization, kidney stones, recurrent UTIs, a single kidney or a urological procedure.   Outpatient Medications Prior to Visit  Medication Sig   ALBUTEROL IN Inhale into the lungs as needed.   Ascorbic Acid (VITAMIN C) 1000 MG tablet Take 1,000 mg by mouth daily.   aspirin EC 81 MG tablet Take 81 mg by mouth daily.   Azelastine HCl 0.15 % SOLN 2 sprays in each nostril   Calcium-Vitamin D-Vitamin K 500-500-40 MG-UNT-MCG CHEW Chew 2 tablets by mouth daily.   Cholecalciferol (VITAMIN D3) 50 MCG (2000 UT) TABS Take 50 mcg by mouth 2 (two) times daily.   Coenzyme Q10 (COQ10 PO) Take 1 capsule by mouth daily.   EPINEPHrine 0.3 mg/0.3 mL IJ SOAJ injection SMARTSIG:1 Pre-Filled Pen Syringe IM As Needed   FLOVENT HFA 110 MCG/ACT inhaler SMARTSIG:2 Puff(s) By Mouth Twice Daily   Multiple Vitamins-Minerals (PRESERVISION AREDS 2) CAPS Take 2 capsules by mouth daily.   omega-3 acid ethyl esters (LOVAZA) 1 g capsule Take by mouth 2 (two) times daily.   pantoprazole (PROTONIX) 40 MG tablet TAKE 1 TABLET BY MOUTH ONCE DAILY AT BEDTIME   PARoxetine (PAXIL)  20 MG tablet Take 1 tablet by mouth once daily   tamoxifen (NOLVADEX) 10 MG tablet Take 1 tablet (10 mg total) by mouth daily.   zinc gluconate 50 MG tablet Take 50 mg by mouth daily.   No facility-administered medications prior to visit.   Reviewed past medical and social history.  Review of Systems  Constitutional:  Negative for chills.  Gastrointestinal:  Negative for abdominal pain, constipation, diarrhea, nausea and vomiting.  Genitourinary:  Positive for dysuria and frequency. Negative for flank pain, hematuria, hesitancy and urgency.      Objective:    Physical Exam Constitutional:      General: She is not in acute distress. Neurological:     Mental Status: She is alert and oriented to person, place, and time.    BP 116/76 (BP Location: Right Arm, Patient Position: Sitting, Cuff Size: Small)   Pulse 72   Temp 97.8 F (36.6 C) (Temporal)   Ht '4\' 11"'$  (1.499 m)   Wt 130 lb 6.4 oz (59.1 kg)   SpO2 97%   BMI 26.34 kg/m    Results for orders placed or performed in visit on 03/03/22  POCT urinalysis dipstick  Result Value Ref Range   Color, UA Yellow    Clarity, UA Cloudy    Glucose, UA Negative Negative   Bilirubin, UA 1+    Ketones, UA Negative    Spec Grav, UA 1.025 1.010 - 1.025  Blood, UA 10    pH, UA 5.5 5.0 - 8.0   Protein, UA Positive (A) Negative   Urobilinogen, UA 0.2 0.2 or 1.0 E.U./dL   Nitrite, UA Positive    Leukocytes, UA Large (3+) (A) Negative   Appearance Cloudy    Odor None       Assessment & Plan:   Problem List Items Addressed This Visit       Other   Elevated LDL cholesterol level   Relevant Orders   Lipid panel   History of elevated glucose   Relevant Orders   Hemoglobin A1c   Other Visit Diagnoses     Acute cystitis without hematuria    -  Primary   Relevant Medications   fosfomycin (MONUROL) 3 g PACK   Other Relevant Orders   POCT urinalysis dipstick (Completed)   Urine Culture        Meds ordered this encounter   Medications   fosfomycin (MONUROL) 3 g PACK    Sig: Take 3 g by mouth once for 1 dose.    Dispense:  3 g    Refill:  0    Order Specific Question:   Supervising Provider    Answer:   Libby Maw [[id number]]   Return if symptoms worsen or fail to improve.    Wilfred Lacy, NP

## 2022-03-05 LAB — URINE CULTURE
MICRO NUMBER:: 13708457
SPECIMEN QUALITY:: ADEQUATE

## 2022-03-06 ENCOUNTER — Telehealth: Payer: Self-pay | Admitting: Family Medicine

## 2022-03-06 NOTE — Telephone Encounter (Signed)
Please advise, patient states that Rx for positive E.coli in urine have not been sent to pharmacy.

## 2022-03-06 NOTE — Telephone Encounter (Signed)
She did, but a little confused after results today patient was thinking there was something else that was going to be sent in. Went over 1 dose Rx that was sent in and taken on 03/03/22 no further questions.

## 2022-03-06 NOTE — Telephone Encounter (Signed)
I didn't know which to send it to.  Pt called and said [facility] had not received her med that was prescribed for ecoli infection. She did not know the name.

## 2022-03-06 NOTE — Telephone Encounter (Signed)
Pt returning your call about her labs.

## 2022-03-10 ENCOUNTER — Encounter: Payer: Self-pay | Admitting: Family Medicine

## 2022-03-10 ENCOUNTER — Telehealth: Payer: Self-pay | Admitting: Family Medicine

## 2022-03-10 DIAGNOSIS — N3000 Acute cystitis without hematuria: Secondary | ICD-10-CM

## 2022-03-10 MED ORDER — NITROFURANTOIN MONOHYD MACRO 100 MG PO CAPS: 100.0000 mg | ORAL_CAPSULE | Freq: Two times a day (BID) | ORAL | 0 refills | Status: AC

## 2022-03-10 NOTE — Telephone Encounter (Signed)
Patient aware and will pick up Rx.  

## 2022-03-10 NOTE — Telephone Encounter (Signed)
Please advise message below, will patient need to come in for follow up?

## 2022-03-10 NOTE — Telephone Encounter (Signed)
Pt stated that she was prescribed fosfomycin (MONUROL) 3 g PACK [[id number]]  ENDED the patient stated that it worked for a little while but symptoms are back again can something else

## 2022-03-22 DIAGNOSIS — J3081 Allergic rhinitis due to animal (cat) (dog) hair and dander: Secondary | ICD-10-CM | POA: Diagnosis not present

## 2022-03-22 DIAGNOSIS — J4520 Mild intermittent asthma, uncomplicated: Secondary | ICD-10-CM | POA: Diagnosis not present

## 2022-03-22 DIAGNOSIS — J301 Allergic rhinitis due to pollen: Secondary | ICD-10-CM | POA: Diagnosis not present

## 2022-03-22 DIAGNOSIS — J3089 Other allergic rhinitis: Secondary | ICD-10-CM | POA: Diagnosis not present

## 2022-03-23 DIAGNOSIS — M5417 Radiculopathy, lumbosacral region: Secondary | ICD-10-CM | POA: Diagnosis not present

## 2022-03-23 DIAGNOSIS — M9901 Segmental and somatic dysfunction of cervical region: Secondary | ICD-10-CM | POA: Diagnosis not present

## 2022-03-23 DIAGNOSIS — M5413 Radiculopathy, cervicothoracic region: Secondary | ICD-10-CM | POA: Diagnosis not present

## 2022-03-23 DIAGNOSIS — M9903 Segmental and somatic dysfunction of lumbar region: Secondary | ICD-10-CM | POA: Diagnosis not present

## 2022-04-12 DIAGNOSIS — M19042 Primary osteoarthritis, left hand: Secondary | ICD-10-CM | POA: Diagnosis not present

## 2022-04-12 DIAGNOSIS — M21611 Bunion of right foot: Secondary | ICD-10-CM | POA: Diagnosis not present

## 2022-04-13 DIAGNOSIS — M9903 Segmental and somatic dysfunction of lumbar region: Secondary | ICD-10-CM | POA: Diagnosis not present

## 2022-04-13 DIAGNOSIS — M9901 Segmental and somatic dysfunction of cervical region: Secondary | ICD-10-CM | POA: Diagnosis not present

## 2022-04-13 DIAGNOSIS — M5417 Radiculopathy, lumbosacral region: Secondary | ICD-10-CM | POA: Diagnosis not present

## 2022-04-13 DIAGNOSIS — M5413 Radiculopathy, cervicothoracic region: Secondary | ICD-10-CM | POA: Diagnosis not present

## 2022-05-11 DIAGNOSIS — M5417 Radiculopathy, lumbosacral region: Secondary | ICD-10-CM | POA: Diagnosis not present

## 2022-05-11 DIAGNOSIS — M9903 Segmental and somatic dysfunction of lumbar region: Secondary | ICD-10-CM | POA: Diagnosis not present

## 2022-05-11 DIAGNOSIS — M9901 Segmental and somatic dysfunction of cervical region: Secondary | ICD-10-CM | POA: Diagnosis not present

## 2022-05-11 DIAGNOSIS — M5413 Radiculopathy, cervicothoracic region: Secondary | ICD-10-CM | POA: Diagnosis not present

## 2022-05-20 ENCOUNTER — Other Ambulatory Visit: Payer: Self-pay | Admitting: Family Medicine

## 2022-05-26 DIAGNOSIS — M19042 Primary osteoarthritis, left hand: Secondary | ICD-10-CM | POA: Diagnosis not present

## 2022-05-30 ENCOUNTER — Other Ambulatory Visit: Payer: Self-pay | Admitting: Family Medicine

## 2022-05-30 DIAGNOSIS — F418 Other specified anxiety disorders: Secondary | ICD-10-CM

## 2022-06-08 DIAGNOSIS — H353132 Nonexudative age-related macular degeneration, bilateral, intermediate dry stage: Secondary | ICD-10-CM | POA: Diagnosis not present

## 2022-06-08 DIAGNOSIS — H43812 Vitreous degeneration, left eye: Secondary | ICD-10-CM | POA: Diagnosis not present

## 2022-06-08 DIAGNOSIS — Z961 Presence of intraocular lens: Secondary | ICD-10-CM | POA: Diagnosis not present

## 2022-06-08 DIAGNOSIS — H04123 Dry eye syndrome of bilateral lacrimal glands: Secondary | ICD-10-CM | POA: Diagnosis not present

## 2022-06-13 ENCOUNTER — Other Ambulatory Visit: Payer: Self-pay | Admitting: *Deleted

## 2022-06-13 DIAGNOSIS — D0512 Intraductal carcinoma in situ of left breast: Secondary | ICD-10-CM

## 2022-06-13 NOTE — Progress Notes (Signed)
Received call from pt with complaint of bilateral breast pain.  Pt states she is currently scheduled to have a Diagnostic Mammogram at [facility] next week and is needing RN to fax over Mammogram orders. RN successfully faxed orders.

## 2022-06-15 DIAGNOSIS — M5417 Radiculopathy, lumbosacral region: Secondary | ICD-10-CM | POA: Diagnosis not present

## 2022-06-15 DIAGNOSIS — M9903 Segmental and somatic dysfunction of lumbar region: Secondary | ICD-10-CM | POA: Diagnosis not present

## 2022-06-15 DIAGNOSIS — M5413 Radiculopathy, cervicothoracic region: Secondary | ICD-10-CM | POA: Diagnosis not present

## 2022-06-15 DIAGNOSIS — M9901 Segmental and somatic dysfunction of cervical region: Secondary | ICD-10-CM | POA: Diagnosis not present

## 2022-06-19 DIAGNOSIS — N644 Mastodynia: Secondary | ICD-10-CM | POA: Diagnosis not present

## 2022-06-19 LAB — HM MAMMOGRAPHY

## 2022-06-20 ENCOUNTER — Encounter: Payer: Self-pay | Admitting: Family Medicine

## 2022-06-20 ENCOUNTER — Encounter: Payer: Self-pay | Admitting: Hematology and Oncology

## 2022-06-25 NOTE — Progress Notes (Signed)
Patient Care Team: Libby Maw, MD as PCP - General (Family Medicine) Mauro Kaufmann, RN as Oncology Nurse Navigator Rockwell Germany, RN as Oncology Nurse Navigator Alphonsa Overall, MD as Consulting Physician (General Surgery) Nicholas Lose, MD as Consulting Physician (Hematology and Oncology) Eppie Gibson, MD as Attending Physician (Radiation Oncology)  DIAGNOSIS: No diagnosis found.  SUMMARY OF ONCOLOGIC HISTORY: Oncology History  Ductal carcinoma in situ (DCIS) of left breast  07/28/2019 Initial Diagnosis   Routine screening mammogram detected a 1.9cm group of calcifications in the left breast. Biopsy showed DCIS, high grade, ER+ 95%, PR+ 90%.   07/30/2019 Cancer Staging   Staging form: Breast, AJCC 8th Edition - Clinical: Stage 0 (cTis (DCIS), cN0, cM0, G3, ER+, PR+, HER2: Not Assessed)    08/12/2019 Genetic Testing   Negative genetic testing. No pathogenic variants identified, VUS in MSH2 Gain (Exons 11-16) identified on the [facility] Common Hereditary Cancers Panel + Breast Cancer STAT Panel. The STAT Breast cancer panel offered by [facility] includes sequencing and rearrangement analysis for the following 9 genes:  ATM, BRCA1, BRCA2, CDH1, CHEK2, PALB2, PTEN, STK11 and TP53.  The Common Hereditary Cancers Panel offered by [facility] includes sequencing and/or deletion duplication testing of the following 47 genes: APC, ATM, AXIN2, BARD1, BMPR1A, BRCA1, BRCA2, BRIP1, CDH1, CDKN2A (p14ARF), CDKN2A (p16INK4a), CKD4, CHEK2, CTNNA1, DICER1, EPCAM (Deletion/duplication testing only), GREM1 (promoter region deletion/duplication testing only), KIT, MEN1, MLH1, MSH2, MSH3, MSH6, MUTYH, NBN, NF1, NHTL1, PALB2, PDGFRA, PMS2, POLD1, POLE, PTEN, RAD50, RAD51C, RAD51D, SDHB, SDHC, SDHD, SMAD4, SMARCA4. STK11, TP53, TSC1, TSC2, and VHL.  The following genes were evaluated for sequence changes only: SDHA and HOXB13 c.251G>A variant only. The report date is 08/12/2019.   08/12/2019 Cancer Staging    Staging form: Breast, AJCC 8th Edition - Pathologic stage from 08/12/2019: Stage 0 (pTis (DCIS), pN0, cM0, ER+, PR+)   08/12/2019 Surgery   Left Breast Lumpectomy [name]) [phone]): high grade DCIS with calcifications and necrosis, spanning 1.5cm. Additional left lateral, medial, and superior magrins showed fibrocystic changes. No regional lymph nodes were examined. ER+ PR+.   09/10/2019 - 10/07/2019 Radiation Therapy   The patient initially received a dose of 40.05 Gy in 15 fractions to the breast using whole-breast tangent fields. This was delivered using a 3-D conformal technique. The pt received a boost delivering an additional 10 Gy in 5 fractions using a electron boost with 62mV electrons. The total dose was 50.05 Gy.   10/2019 - 10/2024 Anti-estrogen oral therapy   Tamoxifen     CHIEF COMPLIANT: Follow up to discuss sore breast  INTERVAL HISTORY: Brenda Delgado a 76y.o. with above-mentioned history of left breast DCIS for which she underwent a lumpectomy, radiation, and is currently on antiestrogen therapy with tamoxifen. She presents to the clinic today for follow-up to discuss sore breast.    ALLERGIES:  is allergic to pravastatin, erythromycin, iohexol, amoxicillin-pot clavulanate, codeine, moxifloxacin, and sulfamethoxazole.  MEDICATIONS:  Current Outpatient Medications  Medication Sig Dispense Refill   ALBUTEROL IN Inhale into the lungs as needed.     Ascorbic Acid (VITAMIN C) 1000 MG tablet Take 1,000 mg by mouth daily.     aspirin EC 81 MG tablet Take 81 mg by mouth daily.     Azelastine HCl 0.15 % SOLN 2 sprays in each nostril     Calcium-Vitamin D-Vitamin K 500-500-40 MG-UNT-MCG CHEW Chew 2 tablets by mouth daily.     Cholecalciferol (VITAMIN D3) 50 MCG (2000 UT) TABS Take 50  mcg by mouth 2 (two) times daily.     Coenzyme Q10 (COQ10 PO) Take 1 capsule by mouth daily.     EPINEPHrine 0.3 mg/0.3 mL IJ SOAJ injection SMARTSIG:1 Pre-Filled Pen Syringe IM As Needed      FLOVENT HFA 110 MCG/ACT inhaler SMARTSIG:2 Puff(s) By Mouth Twice Daily     Multiple Vitamins-Minerals (PRESERVISION AREDS 2) CAPS Take 2 capsules by mouth daily.     omega-3 acid ethyl esters (LOVAZA) 1 g capsule Take by mouth 2 (two) times daily.     pantoprazole (PROTONIX) 40 MG tablet TAKE 1 TABLET BY MOUTH ONCE DAILY AT BEDTIME 90 tablet 0   PARoxetine (PAXIL) 20 MG tablet Take 1 tablet by mouth once daily 90 tablet 0   tamoxifen (NOLVADEX) 10 MG tablet Take 1 tablet (10 mg total) by mouth daily. 90 tablet 4   zinc gluconate 50 MG tablet Take 50 mg by mouth daily.     No current facility-administered medications for this visit.    PHYSICAL EXAMINATION: ECOG PERFORMANCE STATUS: {CHL ONC ECOG PS:[phone]}  There were no vitals filed for this visit. There were no vitals filed for this visit.  BREAST:*** No palpable masses or nodules in either right or left breasts. No palpable axillary supraclavicular or infraclavicular adenopathy no breast tenderness or nipple discharge. (exam performed in the presence of a chaperone)  LABORATORY DATA:  I have reviewed the data as listed    Latest Ref Rng & Units 11/10/2020    9:33 AM 02/02/2020    3:21 PM 07/30/2019    8:44 AM  CMP  Glucose 70 - 99 mg/dL 94  85  97   BUN 6 - 23 mg/dL _0 Creatinine 0.40 - 1.20 mg/dL 0.64  0.67  0.67   Sodium 135 - 145 mEq/L 140  141  143   Potassium 3.5 - 5.1 mEq/L 4.4  4.8  4.4   Chloride 96 - 112 mEq/L 103  103  104   CO2 19 - 32 mEq/L 32  32  30   Calcium 8.4 - 10.5 mg/dL 9.1  9.2  8.9   Total Protein 6.0 - 8.3 g/dL 6.2  6.5  6.5   Total Bilirubin 0.2 - 1.2 mg/dL 0.5  0.3  0.4   Alkaline Phos 39 - 117 U/L 51  49  72   AST 0 - 37 U/L _1 ALT 0 - 35 U/L _2 Lab Results  Component Value Date   WBC 2.4 Repeated and verified X2. (L) 11/10/2020   HGB 14.3 11/10/2020   HCT 43.0 11/10/2020   MCV 91.7 11/10/2020   PLT 250.0 11/10/2020   NEUTROABS 1.7 07/30/2019     ASSESSMENT & PLAN:  No problem-specific Assessment & Plan notes found for this encounter.    No orders of the defined types were placed in this encounter.  The patient has a good understanding of the overall plan. she agrees with it. she will call with any problems that may develop before the next visit here. Total time spent: 30 mins including face to face time and time spent for planning, charting and co-ordination of care   Suzzette Righter, Wright 06/25/22    I Gardiner Coins am scribing for Dr. Lindi Adie  ***

## 2022-06-26 ENCOUNTER — Inpatient Hospital Stay: Payer: Medicare Other | Attending: Hematology and Oncology | Admitting: Hematology and Oncology

## 2022-06-26 ENCOUNTER — Other Ambulatory Visit: Payer: Self-pay

## 2022-06-26 ENCOUNTER — Telehealth: Payer: Self-pay | Admitting: Hematology and Oncology

## 2022-06-26 VITALS — BP 139/70 | HR 92 | Temp 97.8°F | Resp 18 | Ht 59.0 in | Wt 131.9 lb

## 2022-06-26 DIAGNOSIS — Z923 Personal history of irradiation: Secondary | ICD-10-CM | POA: Insufficient documentation

## 2022-06-26 DIAGNOSIS — Z79899 Other long term (current) drug therapy: Secondary | ICD-10-CM | POA: Insufficient documentation

## 2022-06-26 DIAGNOSIS — Z7982 Long term (current) use of aspirin: Secondary | ICD-10-CM | POA: Insufficient documentation

## 2022-06-26 DIAGNOSIS — Z79810 Long term (current) use of selective estrogen receptor modulators (SERMs): Secondary | ICD-10-CM | POA: Insufficient documentation

## 2022-06-26 DIAGNOSIS — D0512 Intraductal carcinoma in situ of left breast: Secondary | ICD-10-CM | POA: Insufficient documentation

## 2022-06-26 NOTE — Assessment & Plan Note (Signed)
07/28/2019:Routine screening mammogram detected a 1.9cm group of calcifications in the left breast. Biopsy showed DCIS, high grade, ER+ 95%, PR+ 90%. Tis NX stage 0 08/12/2019: Left lumpectomy: High-grade DCIS with necrosis and calcifications 1.5 cm, margins negative, ER 95%, PR 90%   Treatment plan: 1.  Adjuvant radiation therapy 09/11/2019-10/06/2019 2.  Follow-up adjuvant antiestrogen therapy with tamoxifen x5 years started 10/19/20 ------------------------------------------------------------------------------------------------------------------------ Tamoxifen Toxicities: Tolerating it extremely well without any problems or concerns Denies any hot flashes or arthralgias or myalgias.   Multiple drainage procedures for seroma: Resolved   Breast Cancer Surveillance: 1. Breast Exam: 06/26/2022: Benign 2. Mammogram: 06/19/2022: [facility] Benign breast density category A no abnormalities detected at the site of breast pain.   I renewed her prescription for tamoxifen for another year. Return to clinic in 1 year for follow-up

## 2022-06-26 NOTE — Telephone Encounter (Signed)
Tried to call 3x per 11/20 in basket, one number will no go through and and left voicemail on husbands number listed

## 2022-07-20 DIAGNOSIS — M5417 Radiculopathy, lumbosacral region: Secondary | ICD-10-CM | POA: Diagnosis not present

## 2022-07-20 DIAGNOSIS — M9903 Segmental and somatic dysfunction of lumbar region: Secondary | ICD-10-CM | POA: Diagnosis not present

## 2022-07-20 DIAGNOSIS — M9901 Segmental and somatic dysfunction of cervical region: Secondary | ICD-10-CM | POA: Diagnosis not present

## 2022-07-20 DIAGNOSIS — M5413 Radiculopathy, cervicothoracic region: Secondary | ICD-10-CM | POA: Diagnosis not present

## 2022-07-28 DIAGNOSIS — H43813 Vitreous degeneration, bilateral: Secondary | ICD-10-CM | POA: Diagnosis not present

## 2022-08-18 ENCOUNTER — Other Ambulatory Visit: Payer: Self-pay | Admitting: Family Medicine

## 2022-08-26 ENCOUNTER — Other Ambulatory Visit: Payer: Self-pay | Admitting: Family Medicine

## 2022-08-26 DIAGNOSIS — F418 Other specified anxiety disorders: Secondary | ICD-10-CM

## 2022-09-06 DIAGNOSIS — H52223 Regular astigmatism, bilateral: Secondary | ICD-10-CM | POA: Diagnosis not present

## 2022-09-06 DIAGNOSIS — H524 Presbyopia: Secondary | ICD-10-CM | POA: Diagnosis not present

## 2022-09-06 DIAGNOSIS — H5213 Myopia, bilateral: Secondary | ICD-10-CM | POA: Diagnosis not present

## 2022-09-06 DIAGNOSIS — H43813 Vitreous degeneration, bilateral: Secondary | ICD-10-CM | POA: Diagnosis not present

## 2022-09-06 DIAGNOSIS — H04123 Dry eye syndrome of bilateral lacrimal glands: Secondary | ICD-10-CM | POA: Diagnosis not present

## 2022-09-06 DIAGNOSIS — H353132 Nonexudative age-related macular degeneration, bilateral, intermediate dry stage: Secondary | ICD-10-CM | POA: Diagnosis not present

## 2022-09-06 DIAGNOSIS — Z961 Presence of intraocular lens: Secondary | ICD-10-CM | POA: Diagnosis not present

## 2022-09-07 DIAGNOSIS — M9901 Segmental and somatic dysfunction of cervical region: Secondary | ICD-10-CM | POA: Diagnosis not present

## 2022-09-07 DIAGNOSIS — M9903 Segmental and somatic dysfunction of lumbar region: Secondary | ICD-10-CM | POA: Diagnosis not present

## 2022-09-07 DIAGNOSIS — M5415 Radiculopathy, thoracolumbar region: Secondary | ICD-10-CM | POA: Diagnosis not present

## 2022-09-07 DIAGNOSIS — M5451 Vertebrogenic low back pain: Secondary | ICD-10-CM | POA: Diagnosis not present

## 2022-09-21 DIAGNOSIS — M9901 Segmental and somatic dysfunction of cervical region: Secondary | ICD-10-CM | POA: Diagnosis not present

## 2022-09-21 DIAGNOSIS — M5451 Vertebrogenic low back pain: Secondary | ICD-10-CM | POA: Diagnosis not present

## 2022-09-21 DIAGNOSIS — M5415 Radiculopathy, thoracolumbar region: Secondary | ICD-10-CM | POA: Diagnosis not present

## 2022-09-21 DIAGNOSIS — M9903 Segmental and somatic dysfunction of lumbar region: Secondary | ICD-10-CM | POA: Diagnosis not present

## 2022-10-19 DIAGNOSIS — M5415 Radiculopathy, thoracolumbar region: Secondary | ICD-10-CM | POA: Diagnosis not present

## 2022-10-19 DIAGNOSIS — M9903 Segmental and somatic dysfunction of lumbar region: Secondary | ICD-10-CM | POA: Diagnosis not present

## 2022-10-19 DIAGNOSIS — M5451 Vertebrogenic low back pain: Secondary | ICD-10-CM | POA: Diagnosis not present

## 2022-10-19 DIAGNOSIS — M9901 Segmental and somatic dysfunction of cervical region: Secondary | ICD-10-CM | POA: Diagnosis not present

## 2022-11-01 DIAGNOSIS — K080 Exfoliation of teeth due to systemic causes: Secondary | ICD-10-CM | POA: Diagnosis not present

## 2022-11-15 ENCOUNTER — Ambulatory Visit (INDEPENDENT_AMBULATORY_CARE_PROVIDER_SITE_OTHER): Payer: Medicare Other | Admitting: Family Medicine

## 2022-11-15 ENCOUNTER — Telehealth: Payer: Medicare Other | Admitting: Nurse Practitioner

## 2022-11-15 VITALS — BP 120/74 | HR 86 | Temp 98.2°F | Ht 59.0 in | Wt 132.2 lb

## 2022-11-15 DIAGNOSIS — J301 Allergic rhinitis due to pollen: Secondary | ICD-10-CM

## 2022-11-15 MED ORDER — HYDROCODONE BIT-HOMATROP MBR 5-1.5 MG/5ML PO SOLN: 5.0000 mL | Freq: Three times a day (TID) | ORAL | 0 refills | Status: DC | PRN

## 2022-11-15 MED ORDER — MOMETASONE FUROATE 50 MCG/ACT NA SUSP: 2.0000 | Freq: Every day | NASAL | 12 refills | Status: AC

## 2022-11-15 NOTE — Progress Notes (Signed)
[facility] [address] [place] [id number] Dept: [phone] Dept Fax: [phone]  Office Visit  Subjective:    Patient ID: Brenda Delgado, female    DOB: [birth year], 77 y.o..   MRN: [id number]  Chief Complaint  Patient presents with   Cough    C/o having cough/chest congestion, sinus pressure, and fever x 4-5 days.   Has taken Allegra, Ibuprofen.    History of Present Illness:  Patient is in today complaining of a 4-5 day history of cough, chest congestion, sinus pressure with nasal congestion and rhinorrhea, and subjective fever. She has a history of allergic rhinitis and does use fexofenadine. She notes the pressure in her sinuses was intense last night, so she was worried she might have a sinus infection.  Past Medical History: Patient Active Problem List   Diagnosis Date Noted   Neck pain 11/10/2020   Urinary frequency 11/10/2020   Abrasion of nose 02/02/2020   Memory change 02/02/2020   Healthcare maintenance 08/13/2019   Family history of ovarian cancer    Family history of colon cancer    Ductal carcinoma in situ (DCIS) of left breast 07/28/2019   History of elevated glucose 07/22/2019   Elevated LDL cholesterol level 07/22/2019   Tremor of face and hands 07/22/2019   Left ureteral stone 05/14/2019   METATARSALGIA 04/15/2008   FOOT PAIN, BILATERAL 04/15/2008   Past Surgical History:  Procedure Laterality Date   ABDOMINAL HYSTERECTOMY     ANTERIOR CERVICAL DECOMP/DISCECTOMY FUSION  01-06-2010   @[facility]    C4--5   BREAST LUMPECTOMY WITH RADIOACTIVE SEED LOCALIZATION Left 08/12/2019   Procedure: LEFT BREAST LUMPECTOMY WITH RADIOACTIVE SEED LOCALIZATION;  Surgeon: Ovidio KinNewman, David, MD;  Location: [facility];  Service: General;  Laterality: Left;   CARPAL TUNNEL RELEASE Right 2000   cataracts  2018   bilateral   CYSTOCELE REPAIR  09/11/2011   Procedure: ANTERIOR REPAIR (CYSTOCELE);  Surgeon: Levi AlandMark E  Anderson, MD;  Location: [facility];  Service: Gynecology;  Laterality: N/A;   CYSTOSCOPY  09/11/2011   Procedure: CYSTOSCOPY;  Surgeon: Levi AlandMark E Anderson, MD;  Location: [facility];  Service: Gynecology;  Laterality: N/A;   CYSTOSCOPY WITH RETROGRADE PYELOGRAM, URETEROSCOPY AND STENT PLACEMENT Left 05/28/2019   Procedure: CYSTOSCOPY WITH RETROGRADE PYELOGRAM, URETEROSCOPY AND STENT REPLACEMENT;  Surgeon: Sebastian AcheManny, Theodore, MD;  [facility];  Service: Urology;  Laterality: Left;   CYSTOSCOPY WITH STENT PLACEMENT Left 05/14/2019   Procedure: CYSTOSCOPY WITH STENT PLACEMENT, RETROGRADE;  Surgeon: Alfredo MartinezMacDiarmid, Scott, MD;  Location: [facility];  Service: Urology;  Laterality: Left;   ELBOW SURGERY Right    EXCISION VULVA CYST Bilateral 01-16-2005  @[facility]    HOLMIUM LASER APPLICATION Left 05/28/2019   Procedure: HOLMIUM LASER APPLICATION;  Surgeon: Sebastian AcheManny, Theodore, MD;  [facility] [place];  Service: Urology;  Laterality: Left;   KNEE ARTHROSCOPY Right 2005;    02-11-2013   KNEE SURGERY Right 08-20-2012   @[facility]    peroneal nerve neurolysis proximal tibia/ fibular articular branch    LAPAROSCOPIC CHOLECYSTECTOMY  1990s   SACROSPINOUS LIGAMENT FIXATION  04-05-2017   @[facility]    UPHOLD vaginal mesh and  Anterior Repair   SALPINGOOPHORECTOMY  09/11/2011   Procedure: SALPINGO OOPHERECTOMY;  Surgeon: Levi AlandMark E Anderson, MD;  Location: [facility];  Service: Gynecology;  Laterality: Bilateral;   SHOULDER SURGERY Right    TONSILLECTOMY     VAGINAL HYSTERECTOMY  09/11/2011   Procedure: HYSTERECTOMY VAGINAL;  Surgeon: Janeece RiggersMark E  Dareen Piano, MD;  Location: [facility];  Service: Gynecology;  Laterality: N/A;   VULVA Ples Specter BIOPSY  09/11/2011   Procedure: VULVAR BIOPSY;  Surgeon: Levi Aland, MD;  Location: [facility];  Service: Gynecology;  Laterality: Left;  sebaceous cyst excision left vula   Family History  Problem Relation Age of Onset   Skin cancer Mother    Colon cancer Paternal Uncle        dx late  50s-60s   Ovarian cancer Paternal Aunt 67   Cancer Cousin        unk type   Outpatient Medications Prior to Visit  Medication Sig Dispense Refill   ALBUTEROL IN Inhale into the lungs as needed.     aspirin EC 81 MG tablet Take 81 mg by mouth daily.     Calcium-Vitamin D-Vitamin K 500-500-40 MG-UNT-MCG CHEW Chew 2 tablets by mouth daily.     Cholecalciferol (VITAMIN D3) 50 MCG (2000 UT) TABS Take 50 mcg by mouth 2 (two) times daily.     Coenzyme Q10 (COQ10 PO) Take 1 capsule by mouth daily.     EPINEPHrine 0.3 mg/0.3 mL IJ SOAJ injection SMARTSIG:1 Pre-Filled Pen Syringe IM As Needed     FLOVENT HFA 110 MCG/ACT inhaler SMARTSIG:2 Puff(s) By Mouth Twice Daily     Multiple Vitamins-Minerals (PRESERVISION AREDS 2) CAPS Take 2 capsules by mouth daily.     omega-3 acid ethyl esters (LOVAZA) 1 g capsule Take by mouth 2 (two) times daily.     pantoprazole (PROTONIX) 40 MG tablet TAKE 1 TABLET BY MOUTH ONCE DAILY AT BEDTIME 90 tablet 0   PARoxetine (PAXIL) 20 MG tablet Take 1 tablet by mouth once daily 90 tablet 0   tamoxifen (NOLVADEX) 10 MG tablet Take 1 tablet (10 mg total) by mouth daily. 90 tablet 4   Ascorbic Acid (VITAMIN C) 1000 MG tablet Take 1,000 mg by mouth daily. (Patient not taking: Reported on 11/15/2022)     zinc gluconate 50 MG tablet Take 50 mg by mouth daily. (Patient not taking: Reported on 11/15/2022)     Azelastine HCl 0.15 % SOLN 2 sprays in each nostril     No facility-administered medications prior to visit.   Allergies  Allergen Reactions   Pravastatin     Other reaction(s): Myalgias (intolerance) 2016   Erythromycin Nausea And Vomiting    Cramps    Iohexol Hives     Code: HIVES, Desc: Pt's husband called when they got home from pt's CEPI #1.  Pt w/ hives/itching.  Instructed on taking Benadryl now, 1hr and q6hrs prn.  Instructed to add IV Contrast to allergy list, esp for CT scans, and to get pre-med info in the future.  jkl, Onset Date: [id number]    Amoxicillin-Pot  Clavulanate Other (See Comments)    GI Upset     Codeine Nausea And Vomiting   Moxifloxacin Itching   Sulfamethoxazole Itching and Rash     Objective:   Today's Vitals   11/15/22 1504  BP: 120/74  Pulse: 86  Temp: 98.2 F (36.8 C)  TempSrc: Temporal  SpO2: 97%  Weight: 132 lb 3.2 oz (60 kg)  Height: 4\' 11"  (1.499 m)   Body mass index is 26.7 kg/m.   General: Well developed, well nourished. No acute distress. HEENT: Normocephalic, non-traumatic. PERRL, EOMI. Conjunctiva clear. External ears normal. Right TM is   mildly congested. Nose reveals mild congestion with scant rhinorrhea.  NO painon percussion over sinuses.   Mucous membranes moist. Oropharynx  clear.  Neck: Supple. No lymphadenopathy. No thyromegaly. Lungs: Clear to auscultation bilaterally. No wheezing, rales or rhonchi. CV: RRR without murmurs or rubs. Pulses 2+ bilaterally. Psych: Alert and oriented. Normal mood and affect.  Health Maintenance Due  Topic Date Due   Hepatitis C Screening  Never done   DTaP/Tdap/Td (2 - Td or Tdap) 02/16/2018   Medicare Annual Wellness (AWV)  11/24/2022     Assessment & Plan:   Problem List Items Addressed This Visit   None Visit Diagnoses     Seasonal allergic rhinitis due to pollen    -  Primary   I do not see sign of a sinus infection. She should continue Allegra. I will add Nasonex. I will provide some cough syrup.   Relevant Medications   mometasone (NASONEX) 50 MCG/ACT nasal spray   HYDROcodone bit-homatropine (HYCODAN) 5-1.5 MG/5ML syrup      Return if symptoms worsen or fail to improve.   Loyola Mast, MD

## 2022-11-16 DIAGNOSIS — M9903 Segmental and somatic dysfunction of lumbar region: Secondary | ICD-10-CM | POA: Diagnosis not present

## 2022-11-16 DIAGNOSIS — M5451 Vertebrogenic low back pain: Secondary | ICD-10-CM | POA: Diagnosis not present

## 2022-11-16 DIAGNOSIS — M5415 Radiculopathy, thoracolumbar region: Secondary | ICD-10-CM | POA: Diagnosis not present

## 2022-11-16 DIAGNOSIS — M9901 Segmental and somatic dysfunction of cervical region: Secondary | ICD-10-CM | POA: Diagnosis not present

## 2022-11-24 ENCOUNTER — Telehealth: Payer: Self-pay | Admitting: Family Medicine

## 2022-11-24 NOTE — Telephone Encounter (Signed)
Contacted [name] to schedule their annual wellness visit. Appointment made for 12/11/22.  Brenda Delgado AWV direct phone # [phone]

## 2022-12-01 ENCOUNTER — Other Ambulatory Visit: Payer: Self-pay | Admitting: Family Medicine

## 2022-12-01 DIAGNOSIS — F418 Other specified anxiety disorders: Secondary | ICD-10-CM

## 2022-12-11 ENCOUNTER — Ambulatory Visit (INDEPENDENT_AMBULATORY_CARE_PROVIDER_SITE_OTHER): Payer: Medicare Other

## 2022-12-11 VITALS — BP 126/62 | HR 76 | Temp 98.9°F | Ht <= 58 in | Wt 132.8 lb

## 2022-12-11 DIAGNOSIS — Z0000 Encounter for general adult medical examination without abnormal findings: Secondary | ICD-10-CM

## 2022-12-11 NOTE — Patient Instructions (Addendum)
Brenda Delgado , Thank you for taking time to come for your Medicare Wellness Visit. I appreciate your ongoing commitment to your health goals. Please review the following plan we discussed and let me know if I can assist you in the future.   These are the goals we discussed:  Goals      Patient Stated     Drink more water & eat healthier     Patient Stated     12/11/2022, wants to exercise more        This is a list of the screening recommended for you and due dates:  Health Maintenance  Topic Date Due   Hepatitis C Screening: USPSTF Recommendation to screen - Ages 36-79 yo.  Never done   DTaP/Tdap/Td vaccine (2 - Td or Tdap) 02/16/2018   COVID-19 Vaccine (6 - 2023-24 season) 04/07/2022   Flu Shot  03/08/2023   Medicare Annual Wellness Visit  12/11/2023   Pneumonia Vaccine  Completed   DEXA scan (bone density measurement)  Completed   Zoster (Shingles) Vaccine  Completed   HPV Vaccine  Aged Out   Colon Cancer Screening  Discontinued    Advanced directives: Please bring a copy of your POA (Power of [place]) and/or Living Will to your next appointment.   Conditions/risks identified: none  Next appointment: Follow up in one year for your annual wellness visit    Preventive Care 65 Years and Older, Female Preventive care refers to lifestyle choices and visits with your health care provider that can promote health and wellness. What does preventive care include? A yearly physical exam. This is also called an annual well check. Dental exams once or twice a year. Routine eye exams. Ask your health care provider how often you should have your eyes checked. Personal lifestyle choices, including: Daily care of your teeth and gums. Regular physical activity. Eating a healthy diet. Avoiding tobacco and drug use. Limiting alcohol use. Practicing safe sex. Taking low-dose aspirin every day. Taking vitamin and mineral supplements as recommended by your health care provider. What  happens during an annual well check? The services and screenings done by your health care provider during your annual well check will depend on your age, overall health, lifestyle risk factors, and family history of disease. Counseling  Your health care provider may ask you questions about your: Alcohol use. Tobacco use. Drug use. Emotional well-being. Home and relationship well-being. Sexual activity. Eating habits. History of falls. Memory and ability to understand (cognition). Work and work Astronomer. Reproductive health. Screening  You may have the following tests or measurements: Height, weight, and BMI. Blood pressure. Lipid and cholesterol levels. These may be checked every 5 years, or more frequently if you are over 16 years old. Skin check. Lung cancer screening. You may have this screening every year starting at age 50 if you have a 30-pack-year history of smoking and currently smoke or have quit within the past 15 years. Fecal occult blood test (FOBT) of the stool. You may have this test every year starting at age 22. Flexible sigmoidoscopy or colonoscopy. You may have a sigmoidoscopy every 5 years or a colonoscopy every 10 years starting at age 46. Hepatitis C blood test. Hepatitis B blood test. Sexually transmitted disease (STD) testing. Diabetes screening. This is done by checking your blood sugar (glucose) after you have not eaten for a while (fasting). You may have this done every 1-3 years. Bone density scan. This is done to screen for osteoporosis. You may have  this done starting at age 25. Mammogram. This may be done every 1-2 years. Talk to your health care provider about how often you should have regular mammograms. Talk with your health care provider about your test results, treatment options, and if necessary, the need for more tests. Vaccines  Your health care provider may recommend certain vaccines, such as: Influenza vaccine. This is recommended every  year. Tetanus, diphtheria, and acellular pertussis (Tdap, Td) vaccine. You may need a Td booster every 10 years. Zoster vaccine. You may need this after age 58. Pneumococcal 13-valent conjugate (PCV13) vaccine. One dose is recommended after age 38. Pneumococcal polysaccharide (PPSV23) vaccine. One dose is recommended after age 24. Talk to your health care provider about which screenings and vaccines you need and how often you need them. This information is not intended to replace advice given to you by your health care provider. Make sure you discuss any questions you have with your health care provider. Document Released: 08/20/2015 Document Revised: 04/12/2016 Document Reviewed: 05/25/2015 Elsevier Interactive Patient Education  2017 [place] Prevention in the Home Falls can cause injuries. They can happen to people of all ages. There are many things you can do to make your home safe and to help prevent falls. What can I do on the outside of my home? Regularly fix the edges of walkways and driveways and fix any cracks. Remove anything that might make you trip as you walk through a door, such as a raised step or threshold. Trim any bushes or trees on the path to your home. Use bright outdoor lighting. Clear any walking paths of anything that might make someone trip, such as rocks or tools. Regularly check to see if handrails are loose or broken. Make sure that both sides of any steps have handrails. Any raised decks and porches should have guardrails on the edges. Have any leaves, snow, or ice cleared regularly. Use sand or salt on walking paths during winter. Clean up any spills in your garage right away. This includes oil or grease spills. What can I do in the bathroom? Use night lights. Install grab bars by the toilet and in the tub and shower. Do not use towel bars as grab bars. Use non-skid mats or decals in the tub or shower. If you need to sit down in the shower, use a  plastic, non-slip stool. Keep the floor dry. Clean up any water that spills on the floor as soon as it happens. Remove soap buildup in the tub or shower regularly. Attach bath mats securely with double-sided non-slip rug tape. Do not have throw rugs and other things on the floor that can make you trip. What can I do in the bedroom? Use night lights. Make sure that you have a light by your bed that is easy to reach. Do not use any sheets or blankets that are too big for your bed. They should not hang down onto the floor. Have a firm chair that has side arms. You can use this for support while you get dressed. Do not have throw rugs and other things on the floor that can make you trip. What can I do in the kitchen? Clean up any spills right away. Avoid walking on wet floors. Keep items that you use a lot in easy-to-reach places. If you need to reach something above you, use a strong step stool that has a grab bar. Keep electrical cords out of the way. Do not use floor polish or  wax that makes floors slippery. If you must use wax, use non-skid floor wax. Do not have throw rugs and other things on the floor that can make you trip. What can I do with my stairs? Do not leave any items on the stairs. Make sure that there are handrails on both sides of the stairs and use them. Fix handrails that are broken or loose. Make sure that handrails are as long as the stairways. Check any carpeting to make sure that it is firmly attached to the stairs. Fix any carpet that is loose or worn. Avoid having throw rugs at the top or bottom of the stairs. If you do have throw rugs, attach them to the floor with carpet tape. Make sure that you have a light switch at the top of the stairs and the bottom of the stairs. If you do not have them, ask someone to add them for you. What else can I do to help prevent falls? Wear shoes that: Do not have high heels. Have rubber bottoms. Are comfortable and fit you  well. Are closed at the toe. Do not wear sandals. If you use a stepladder: Make sure that it is fully opened. Do not climb a closed stepladder. Make sure that both sides of the stepladder are locked into place. Ask someone to hold it for you, if possible. Clearly mark and make sure that you can see: Any grab bars or handrails. First and last steps. Where the edge of each step is. Use tools that help you move around (mobility aids) if they are needed. These include: Canes. Walkers. Scooters. Crutches. Turn on the lights when you go into a dark area. Replace any light bulbs as soon as they burn out. Set up your furniture so you have a clear path. Avoid moving your furniture around. If any of your floors are uneven, fix them. If there are any pets around you, be aware of where they are. Review your medicines with your doctor. Some medicines can make you feel dizzy. This can increase your chance of falling. Ask your doctor what other things that you can do to help prevent falls. This information is not intended to replace advice given to you by your health care provider. Make sure you discuss any questions you have with your health care provider. Document Released: 05/20/2009 Document Revised: 12/30/2015 Document Reviewed: 08/28/2014 Elsevier Interactive Patient Education  2017 [employer].

## 2022-12-11 NOTE — Progress Notes (Signed)
Subjective:   Brenda Delgado is a 77 y.o. female who presents for Medicare Annual (Subsequent) preventive examination.  Review of Systems     Cardiac Risk Factors include: advanced age (>52men, >55 women)     Objective:    Today's Vitals   12/11/22 1330 12/11/22 1334  BP: 126/62   Pulse: 76   Temp: 98.9 F (37.2 C)   TempSrc: Oral   SpO2: 93%   Weight: 132 lb 12.8 oz (60.2 kg)   Height: 4\' 10"  (1.473 m)   PainSc:  5    Body mass index is 27.76 kg/m.     12/11/2022    1:41 PM 11/23/2021    1:18 PM 03/24/2021    8:00 PM 10/19/2020    9:52 AM 02/04/2020    8:46 AM 01/12/2020    9:03 PM 11/07/2019    8:58 AM  Advanced Directives  Does Patient Have a Medical Advance Directive? Yes Yes No Yes Yes No No  Type of Estate agent of [place];Living will Healthcare Power of [place];Living will  Healthcare Power of [place];Living will Healthcare Power of [place];Living will    Copy of Healthcare Power of Attorney in Chart? No - copy requested No - copy requested  No - copy requested     Would patient like information on creating a medical advance directive?   No - Patient declined   No - Patient declined No - Patient declined    Current Medications (verified) Outpatient Encounter Medications as of 12/11/2022  Medication Sig   ALBUTEROL IN Inhale into the lungs as needed.   Apoaequorin (PREVAGEN PO) Take by mouth.   Ascorbic Acid (VITAMIN C) 1000 MG tablet Take 1,000 mg by mouth daily.   aspirin EC 81 MG tablet Take 81 mg by mouth daily.   Calcium-Vitamin D-Vitamin K 500-500-40 MG-UNT-MCG CHEW Chew 2 tablets by mouth daily.   Cholecalciferol (VITAMIN D3) 50 MCG (2000 UT) TABS Take 50 mcg by mouth 2 (two) times daily.   Coenzyme Q10 (COQ10 PO) Take 1 capsule by mouth daily.   EPINEPHrine 0.3 mg/0.3 mL IJ SOAJ injection SMARTSIG:1 Pre-Filled Pen Syringe IM As Needed   FLOVENT HFA 110 MCG/ACT inhaler SMARTSIG:2 Puff(s) By Mouth Twice Daily   meloxicam (MOBIC) 15 MG  tablet Take 15 mg by mouth daily as needed for pain.   mometasone (NASONEX) 50 MCG/ACT nasal spray Place 2 sprays into the nose daily.   Multiple Vitamins-Minerals (PRESERVISION AREDS 2) CAPS Take 2 capsules by mouth daily.   omega-3 acid ethyl esters (LOVAZA) 1 g capsule Take by mouth 2 (two) times daily.   pantoprazole (PROTONIX) 40 MG tablet TAKE 1 TABLET BY MOUTH ONCE DAILY AT BEDTIME   PARoxetine (PAXIL) 20 MG tablet Take 1 tablet by mouth once daily   tamoxifen (NOLVADEX) 10 MG tablet Take 1 tablet (10 mg total) by mouth daily.   zinc gluconate 50 MG tablet Take 50 mg by mouth daily.   HYDROcodone bit-homatropine (HYCODAN) 5-1.5 MG/5ML syrup Take 5 mLs by mouth every 8 (eight) hours as needed for cough. (Patient not taking: Reported on 12/11/2022)   No facility-administered encounter medications on file as of 12/11/2022.    Allergies (verified) Pravastatin, Erythromycin, Iohexol, Amoxicillin-pot clavulanate, Codeine, Moxifloxacin, and Sulfamethoxazole   History: Past Medical History:  Diagnosis Date   Allergy    Anxiety    Arthritis    right knee   Cancer (HCC) 07/2019   left breast DCIS   Complication of anesthesia  per pt with surgery 05-14-2019 right facial swelling and lip/ mouth blister's   Constipation    Depression    Environmental and seasonal allergies    Family history of colon cancer    Family history of ovarian cancer    GERD (gastroesophageal reflux disease)    Hiatal hernia    History of kidney stones    Left ureteral stone    Mild asthma    Nephrolithiasis    Nocturia    PONV (postoperative nausea and vomiting)    Pre-diabetes    Renal calculus, left    Wears glasses    Wears hearing aid in both ears    Past Surgical History:  Procedure Laterality Date   ABDOMINAL HYSTERECTOMY     ANTERIOR CERVICAL DECOMP/DISCECTOMY FUSION  01-06-2010   @[facility]    C4--5   BREAST LUMPECTOMY WITH RADIOACTIVE SEED LOCALIZATION Left 08/12/2019   Procedure: LEFT BREAST  LUMPECTOMY WITH RADIOACTIVE SEED LOCALIZATION;  Surgeon: Ovidio Kin, MD;  Location: [facility];  Service: General;  Laterality: Left;   CARPAL TUNNEL RELEASE Right 2000   cataracts  2018   bilateral   CYSTOCELE REPAIR  09/11/2011   Procedure: ANTERIOR REPAIR (CYSTOCELE);  Surgeon: Levi Aland, MD;  Location: [facility];  Service: Gynecology;  Laterality: N/A;   CYSTOSCOPY  09/11/2011   Procedure: CYSTOSCOPY;  Surgeon: Levi Aland, MD;  Location: [facility];  Service: Gynecology;  Laterality: N/A;   CYSTOSCOPY WITH RETROGRADE PYELOGRAM, URETEROSCOPY AND STENT PLACEMENT Left 05/28/2019   Procedure: CYSTOSCOPY WITH RETROGRADE PYELOGRAM, URETEROSCOPY AND STENT REPLACEMENT;  Surgeon: Sebastian Ache, MD;  Location[facility];  Service: Urology;  Laterality: Left;   CYSTOSCOPY WITH STENT PLACEMENT Left 05/14/2019   Procedure: CYSTOSCOPY WITH STENT PLACEMENT, RETROGRADE;  Surgeon: Alfredo Martinez, MD;  Location: [facility];  Service: Urology;  Laterality: Left;   ELBOW SURGERY Right    EXCISION VULVA CYST Bilateral 01-16-2005  @[facility]    HOLMIUM LASER APPLICATION Left 05/28/2019   Procedure: HOLMIUM LASER APPLICATION;  Surgeon: Sebastian Ache, MD;  Location[facility];  Service: Urology;  Laterality: Left;   KNEE ARTHROSCOPY Right 2005;    02-11-2013   KNEE SURGERY Right 08-20-2012   @[facility]    peroneal nerve neurolysis proximal tibia/ fibular articular branch    LAPAROSCOPIC CHOLECYSTECTOMY  1990s   SACROSPINOUS LIGAMENT FIXATION  04-05-2017   @[facility]    UPHOLD vaginal mesh and  Anterior Repair   SALPINGOOPHORECTOMY  09/11/2011   Procedure: SALPINGO OOPHERECTOMY;  Surgeon: Levi Aland, MD;  Location: [facility];  Service: Gynecology;  Laterality: Bilateral;   SHOULDER SURGERY Right    TONSILLECTOMY     VAGINAL HYSTERECTOMY  09/11/2011   Procedure: HYSTERECTOMY VAGINAL;  Surgeon: Levi Aland, MD;  Location: [facility];  Service: Gynecology;  Laterality: N/A;    VULVA Ples Specter BIOPSY  09/11/2011   Procedure: VULVAR BIOPSY;  Surgeon: Levi Aland, MD;  Location: [facility];  Service: Gynecology;  Laterality: Left;  sebaceous cyst excision left vula   Family History  Problem Relation Age of Onset   Skin cancer Mother    Colon cancer Paternal Uncle        dx late 36s-60s   Ovarian cancer Paternal Aunt 97   Cancer Cousin        unk type   Social History   Socioeconomic History   Marital status: Married    Spouse name: Not on file   Number of children: Not on file  Years of education: Not on file   Highest education level: Not on file  Occupational History   Not on file  Tobacco Use   Smoking status: Never   Smokeless tobacco: Never  Vaping Use   Vaping Use: Never used  Substance and Sexual Activity   Alcohol use: Yes    Comment: occasional   Drug use: Never   Sexual activity: Not on file  Other Topics Concern   Not on file  Social History Narrative   Right handed   Social Determinants of Health   Financial Resource Strain: Low Risk  (12/11/2022)   Overall Financial Resource Strain (CARDIA)    Difficulty of Paying Living Expenses: Not hard at all  Food Insecurity: No Food Insecurity (12/11/2022)   Hunger Vital Sign    Worried About Running Out of Food in the Last Year: Never true    Ran Out of Food in the Last Year: Never true  Transportation Needs: No Transportation Needs (12/11/2022)   PRAPARE - Administrator, Civil Service (Medical): No    Lack of Transportation (Non-Medical): No  Physical Activity: Inactive (12/11/2022)   Exercise Vital Sign    Days of Exercise per Week: 0 days    Minutes of Exercise per Session: 0 min  Stress: No Stress Concern Present (12/11/2022)   [employer] of Occupational Health - Occupational Stress Questionnaire    Feeling of Stress : Not at all  Social Connections: Moderately Integrated (11/23/2021)   Social Connection and Isolation Panel [NHANES]    Frequency of Communication with  Friends and Family: Three times a week    Frequency of Social Gatherings with Friends and Family: Three times a week    Attends Religious Services: More than 4 times per year    Active Member of Clubs or Organizations: No    Attends Banker Meetings: Never    Marital Status: Married    Tobacco Counseling Counseling given: Not Answered   Clinical Intake:  Pre-visit preparation completed: Yes  Pain : 0-10 Pain Score: 5  Pain Type: Chronic pain Pain Location: Shoulder Pain Orientation: Right Pain Descriptors / Indicators: Aching Pain Onset: More than a month ago Pain Frequency: Constant     Nutritional Status: BMI 25 -29 Overweight Nutritional Risks: None Diabetes: No  How often do you need to have someone help you when you read instructions, pamphlets, or other written materials from your doctor or pharmacy?: 1 - Never  Diabetic?no  Interpreter Needed?: No  Information entered by :: NAllen LPN   Activities of Daily Living    12/11/2022    1:43 PM  In your present state of health, do you have any difficulty performing the following activities:  Hearing? 1  Comment hearing aids do not help  Vision? 0  Difficulty concentrating or making decisions? 1  Walking or climbing stairs? 0  Dressing or bathing? 0  Doing errands, shopping? 0  Preparing Food and eating ? N  Using the Toilet? N  In the past six months, have you accidently leaked urine? N  Do you have problems with loss of bowel control? N  Managing your Medications? N  Managing your Finances? N  Housekeeping or managing your Housekeeping? N    Patient Care Team: Mliss Sax, MD as PCP - General (Family Medicine) Pershing Proud, RN as Oncology Nurse Navigator Rogelia Boga, Eileen Stanford, RN as Oncology Nurse Navigator Ovidio Kin, MD as Consulting Physician (General Surgery) Serena Croissant, MD as Consulting  Physician (Hematology and Oncology) Lonie Peak, MD as Attending Physician  (Radiation Oncology)  Indicate any recent Medical Services you may have received from other than [facility] providers in the past year (date may be approximate).     Assessment:   This is a routine wellness examination for [place].  Hearing/Vision screen Vision Screening - Comments:: Regular eye exams, [facility]  Dietary issues and exercise activities discussed: Current Exercise Habits: The patient does not participate in regular exercise at present   Goals Addressed             This Visit's Progress    Patient Stated       12/11/2022, wants to exercise more       Depression Screen    12/11/2022    1:42 PM 11/23/2021    1:22 PM 11/23/2021    1:17 PM 11/10/2020    9:24 AM 11/10/2020    8:44 AM 10/19/2020    9:55 AM 07/22/2019    9:39 AM  PHQ 2/9 Scores  PHQ - 2 Score 0 0 0 0 0 0 0  PHQ- 9 Score    2       Fall Risk    12/11/2022    1:41 PM 11/15/2022    3:06 PM 11/23/2021    1:19 PM 11/10/2020    8:44 AM 10/19/2020    9:54 AM  Fall Risk   Falls in the past year? 1 0 0 0 0  Comment tripped, missed a step      Number falls in past yr: 1 0 0  0  Injury with Fall? 0 0 0  0  Risk for fall due to : Medication side effect No Fall Risks     Follow up Falls prevention discussed;Education provided;Falls evaluation completed Falls evaluation completed Falls evaluation completed  Falls prevention discussed    FALL RISK PREVENTION PERTAINING TO THE HOME:  Any stairs in or around the home? Yes  If so, are there any without handrails? No  Home free of loose throw rugs in walkways, pet beds, electrical cords, etc? Yes  Adequate lighting in your home to reduce risk of falls? Yes   ASSISTIVE DEVICES UTILIZED TO PREVENT FALLS:  Life alert? No  Use of a cane, walker or w/c? No  Grab bars in the bathroom? Yes  Shower chair or bench in shower? Yes  Elevated toilet seat or a handicapped toilet? No   TIMED UP AND GO:  Was the test performed? Yes .  Length of time to ambulate 10 feet: 6  sec.   Gait slow and steady without use of assistive device  Cognitive Function:      04/02/2020    9:00 AM  Montreal Cognitive Assessment   Visuospatial/ Executive (0/5) 3  Naming (0/3) 3  Attention: Read list of digits (0/2) 2  Attention: Read list of letters (0/1) 1  Attention: Serial 7 subtraction starting at 100 (0/3) 3  Language: Repeat phrase (0/2) 0  Language : Fluency (0/1) 1  Abstraction (0/2) 2  Delayed Recall (0/5) 2  Orientation (0/6) 6  Total 23  Adjusted Score (based on education) 23      12/11/2022    1:49 PM 11/23/2021    1:22 PM  6CIT Screen  What Year? 0 points 0 points  What month? 0 points 0 points  What time? 0 points 0 points  Count back from 20 0 points 0 points  Months in reverse 0 points 0 points  Repeat phrase 4 points 4 points  Total Score 4 points 4 points    Immunizations Immunization History  Administered Date(s) Administered   Influenza Split 06/08/2007, 06/07/2009, 05/07/2010, 06/05/2012, 05/27/2013, 04/28/2014, 06/21/2015, 06/07/2016   Influenza Whole 06/08/2022   Influenza, High Dose Seasonal PF 06/27/2018, 07/15/2018, 05/29/2019, 09/03/2019, 09/02/2020, 09/28/2021   Influenza,inj,Quad PF,6+ Mos 06/22/2016   Influenza,inj,quad, With Preservative 04/28/2014   Influenza-Unspecified 06/08/2007, 06/07/2009, 05/07/2010, 06/05/2012, 05/27/2013, 04/28/2014, 06/21/2015, 06/07/2016, 05/19/2020, 06/09/2021   PFIZER(Purple Top)SARS-COV-2 Vaccination 08/19/2019, 09/17/2019, 06/15/2020, 09/02/2020, 07/14/2021   PPD Test 08/19/2010, 08/19/2010   Pneumococcal Conjugate-13 06/25/2015   Pneumococcal Polysaccharide-23 09/12/2012, 09/02/2020, 09/28/2021   Respiratory Syncytial Virus Vaccine,Recomb Aduvanted(Arexvy) 06/22/2022   Tdap 02/17/2008   Zoster Recombinat (Shingrix) 08/30/2020, 01/25/2021    TDAP status: Due, Education has been provided regarding the importance of this vaccine. Advised may receive this vaccine at local pharmacy or Health  Dept. Aware to provide a copy of the vaccination record if obtained from local pharmacy or Health Dept. Verbalized acceptance and understanding.  Flu Vaccine status: Up to date  Pneumococcal vaccine status: Up to date  Covid-19 vaccine status: Completed vaccines  Qualifies for Shingles Vaccine? Yes   Zostavax completed Yes   Shingrix Completed?: Yes  Screening Tests Health Maintenance  Topic Date Due   Hepatitis C Screening  Never done   DTaP/Tdap/Td (2 - Td or Tdap) 02/16/2018   COVID-19 Vaccine (6 - 2023-24 season) 04/07/2022   INFLUENZA VACCINE  03/08/2023   Medicare Annual Wellness (AWV)  12/11/2023   Pneumonia Vaccine 62+ Years old  Completed   DEXA SCAN  Completed   Zoster Vaccines- Shingrix  Completed   HPV VACCINES  Aged Out   COLONOSCOPY (Pts 45-61yrs Insurance coverage will need to be confirmed)  Discontinued    Health Maintenance  Health Maintenance Due  Topic Date Due   Hepatitis C Screening  Never done   DTaP/Tdap/Td (2 - Td or Tdap) 02/16/2018   COVID-19 Vaccine (6 - 2023-24 season) 04/07/2022    Colorectal cancer screening: Type of screening: Colonoscopy. Completed 11/21/2017. Repeat every 5 years  Mammogram status: Completed 06/19/2022. Repeat every year  Bone Density status: Completed 02/16/2021.   Lung Cancer Screening: (Low Dose CT Chest recommended if Age 67-80 years, 30 pack-year currently smoking OR have quit w/in 15years.) does not qualify.   Lung Cancer Screening Referral: no  Additional Screening:  Hepatitis C Screening: does qualify;   Vision Screening: Recommended annual ophthalmology exams for early detection of glaucoma and other disorders of the eye. Is the patient up to date with their annual eye exam?  Yes  Who is the provider or what is the name of the office in which the patient attends annual eye exams? [facility] If pt is not established with a provider, would they like to be referred to a provider to establish care? No .    Dental Screening: Recommended annual dental exams for proper oral hygiene  Community Resource Referral / Chronic Care Management: CRR required this visit?  No   CCM required this visit?  No      Plan:     I have personally reviewed and noted the following in the patient's chart:   Medical and social history Use of alcohol, tobacco or illicit drugs  Current medications and supplements including opioid prescriptions. Patient is not currently taking opioid prescriptions. Functional ability and status Nutritional status Physical activity Advanced directives List of other physicians Hospitalizations, surgeries, and ER visits in previous 12 months Vitals  Screenings to include cognitive, depression, and falls Referrals and appointments  In addition, I have reviewed and discussed with patient certain preventive protocols, quality metrics, and best practice recommendations. A written personalized care plan for preventive services as well as general preventive health recommendations were provided to patient.     Barb Merino, LPN   08/12/1094   Nurse Notes: none

## 2022-12-12 ENCOUNTER — Ambulatory Visit (INDEPENDENT_AMBULATORY_CARE_PROVIDER_SITE_OTHER): Payer: Medicare Other | Admitting: Family Medicine

## 2022-12-12 ENCOUNTER — Encounter: Payer: Self-pay | Admitting: Family Medicine

## 2022-12-12 VITALS — BP 134/66 | HR 88 | Temp 98.3°F | Ht <= 58 in | Wt 132.4 lb

## 2022-12-12 DIAGNOSIS — R251 Tremor, unspecified: Secondary | ICD-10-CM

## 2022-12-12 DIAGNOSIS — F418 Other specified anxiety disorders: Secondary | ICD-10-CM | POA: Insufficient documentation

## 2022-12-12 DIAGNOSIS — R7309 Other abnormal glucose: Secondary | ICD-10-CM

## 2022-12-12 DIAGNOSIS — R4189 Other symptoms and signs involving cognitive functions and awareness: Secondary | ICD-10-CM | POA: Diagnosis not present

## 2022-12-12 DIAGNOSIS — N3289 Other specified disorders of bladder: Secondary | ICD-10-CM | POA: Insufficient documentation

## 2022-12-12 DIAGNOSIS — K219 Gastro-esophageal reflux disease without esophagitis: Secondary | ICD-10-CM | POA: Diagnosis not present

## 2022-12-12 MED ORDER — PAROXETINE HCL 20 MG PO TABS: 20.0000 mg | ORAL_TABLET | Freq: Every day | ORAL | 3 refills | Status: DC

## 2022-12-12 MED ORDER — PANTOPRAZOLE SODIUM 40 MG PO TBEC: 40.0000 mg | DELAYED_RELEASE_TABLET | Freq: Every day | ORAL | 3 refills | Status: DC

## 2022-12-12 NOTE — Progress Notes (Signed)
Established Patient Office Visit   Subjective:  Patient ID: Brenda Delgado, female    DOB: [birth year]  Age: 77 y.o. MRN: [id number]  Chief Complaint  Patient presents with   Medical Management of Chronic Issues    Discuss tremors, referral to urologist for bladder tac, refill on medications. Would like prediabetes checked.     HPI Encounter Diagnoses  Name Primary?   Irritable bladder Yes   Depression with anxiety    Gastroesophageal reflux disease, unspecified whether esophagitis present    Cognitive decline    Tremor of face and hands    Elevated glucose    For follow-up of above.   Review of Systems  Constitutional: Negative.   HENT: Negative.    Eyes:  Negative for blurred vision, discharge and redness.  Respiratory: Negative.    Cardiovascular: Negative.   Gastrointestinal:  Negative for abdominal pain.  Genitourinary: Negative.   Musculoskeletal: Negative.  Negative for myalgias.  Skin:  Negative for rash.  Neurological:  Negative for tingling, loss of consciousness and weakness.  Endo/Heme/Allergies:  Negative for polydipsia.      12/12/2022    2:08 PM 12/11/2022    1:42 PM 11/23/2021    1:22 PM  Depression screen PHQ 2/9  Decreased Interest 0 0 0  Down, Depressed, Hopeless 0 0 0  PHQ - 2 Score 0 0 0       Current Outpatient Medications:    ALBUTEROL IN, Inhale into the lungs as needed., Disp: , Rfl:    Apoaequorin (PREVAGEN PO), Take by mouth., Disp: , Rfl:    Ascorbic Acid (VITAMIN C) 1000 MG tablet, Take 1,000 mg by mouth daily., Disp: , Rfl:    aspirin EC 81 MG tablet, Take 81 mg by mouth daily., Disp: , Rfl:    Calcium-Vitamin D-Vitamin K 500-500-40 MG-UNT-MCG CHEW, Chew 2 tablets by mouth daily., Disp: , Rfl:    Cholecalciferol (VITAMIN D3) 50 MCG (2000 UT) TABS, Take 50 mcg by mouth 2 (two) times daily., Disp: , Rfl:    Coenzyme Q10 (COQ10 PO), Take 1 capsule by mouth daily., Disp: , Rfl:    EPINEPHrine 0.3 mg/0.3 mL IJ SOAJ injection, SMARTSIG:1  Pre-Filled Pen Syringe IM As Needed, Disp: , Rfl:    FLOVENT HFA 110 MCG/ACT inhaler, SMARTSIG:2 Puff(s) By Mouth Twice Daily, Disp: , Rfl:    meloxicam (MOBIC) 15 MG tablet, Take 15 mg by mouth daily as needed for pain., Disp: , Rfl:    mometasone (NASONEX) 50 MCG/ACT nasal spray, Place 2 sprays into the nose daily., Disp: 1 each, Rfl: 12   Multiple Vitamins-Minerals (PRESERVISION AREDS 2) CAPS, Take 2 capsules by mouth daily., Disp: , Rfl:    omega-3 acid ethyl esters (LOVAZA) 1 g capsule, Take by mouth 2 (two) times daily., Disp: , Rfl:    tamoxifen (NOLVADEX) 10 MG tablet, Take 1 tablet (10 mg total) by mouth daily., Disp: 90 tablet, Rfl: 4   zinc gluconate 50 MG tablet, Take 50 mg by mouth daily., Disp: , Rfl:    pantoprazole (PROTONIX) 40 MG tablet, Take 1 tablet (40 mg total) by mouth at bedtime., Disp: 90 tablet, Rfl: 3   PARoxetine (PAXIL) 20 MG tablet, Take 1 tablet (20 mg total) by mouth daily., Disp: 90 tablet, Rfl: 3   Objective:     BP 134/66 (BP Location: Left Arm, Patient Position: Sitting, Cuff Size: Normal)   Pulse 88   Temp 98.3 F (36.8 C) (Temporal)   Ht 4\' 10"  (  1.473 m)   Wt 132 lb 6.4 oz (60.1 kg)   SpO2 96%   BMI 27.67 kg/m    Physical Exam Constitutional:      General: She is not in acute distress.    Appearance: Normal appearance. She is not ill-appearing, toxic-appearing or diaphoretic.  HENT:     Head: Normocephalic and atraumatic.     Right Ear: External ear normal.     Left Ear: External ear normal.     Mouth/Throat:     Mouth: Mucous membranes are moist.     Pharynx: Oropharynx is clear. No oropharyngeal exudate or posterior oropharyngeal erythema.  Eyes:     General: No scleral icterus.       Right eye: No discharge.        Left eye: No discharge.     Extraocular Movements: Extraocular movements intact.     Conjunctiva/sclera: Conjunctivae normal.     Pupils: Pupils are equal, round, and reactive to light.  Cardiovascular:     Rate and  Rhythm: Normal rate and regular rhythm.  Pulmonary:     Effort: Pulmonary effort is normal. No respiratory distress.     Breath sounds: Normal breath sounds.  Abdominal:     General: Bowel sounds are normal.     Tenderness: There is no abdominal tenderness. There is no guarding.  Musculoskeletal:     Cervical back: No rigidity or tenderness.  Skin:    General: Skin is warm and dry.  Neurological:     Mental Status: She is alert and oriented to person, place, and time.     Motor: Tremor present.     Comments: Tremor and face present.  Tremor of hands appears to be symmetrical.  Psychiatric:        Mood and Affect: Mood normal.        Behavior: Behavior normal.      No results found for any visits on 12/12/22.    The ASCVD Risk score (Arnett DK, et al., 2019) failed to calculate for the following reasons:   Cannot find a previous HDL lab   Cannot find a previous total cholesterol lab    Assessment & Plan:   Irritable bladder -     Urine Culture -     Urinalysis, Routine w reflex microscopic  Depression with anxiety -     PARoxetine HCl; Take 1 tablet (20 mg total) by mouth daily.  Dispense: 90 tablet; Refill: 3  Gastroesophageal reflux disease, unspecified whether esophagitis present -     Pantoprazole Sodium; Take 1 tablet (40 mg total) by mouth at bedtime.  Dispense: 90 tablet; Refill: 3  Cognitive decline -     CBC  Tremor of face and hands  Elevated glucose -     Hemoglobin A1c -     Basic metabolic panel    Return in about 3 months (around 03/14/2023).  Has seen a urologist in [place].  It has been recommended that she follow-up there.  They will call and let me know the doctor's name.  Mliss Sax, MD

## 2022-12-13 LAB — BASIC METABOLIC PANEL
BUN: 19 mg/dL (ref 6–23)
CO2: 30 mEq/L (ref 19–32)
Calcium: 8.9 mg/dL (ref 8.4–10.5)
Chloride: 103 mEq/L (ref 96–112)
Creatinine, Ser: 0.71 mg/dL (ref 0.40–1.20)
GFR: 82.67 mL/min (ref >60.00)
Glucose, Bld: 110 mg/dL — ABNORMAL HIGH (ref 70–99)
Potassium: 4.2 mEq/L (ref 3.5–5.1)
Sodium: 142 mEq/L (ref 135–145)

## 2022-12-13 LAB — URINALYSIS, ROUTINE W REFLEX MICROSCOPIC
Bilirubin Urine: NEGATIVE
Hgb urine dipstick: NEGATIVE
Leukocytes,Ua: NEGATIVE
Nitrite: NEGATIVE
RBC / HPF: NONE SEEN (ref 0–?)
Specific Gravity, Urine: 1.025 (ref 1.000–1.030)
Total Protein, Urine: NEGATIVE
Urine Glucose: NEGATIVE
Urobilinogen, UA: 1 (ref 0.0–1.0)
WBC, UA: NONE SEEN (ref 0–?)
pH: 6 (ref 5.0–8.0)

## 2022-12-13 LAB — CBC
HCT: 43.2 % (ref 36.0–46.0)
Hemoglobin: 13.9 g/dL (ref 12.0–15.0)
MCHC: 32.2 g/dL (ref 30.0–36.0)
MCV: 91.6 fl (ref 78.0–100.0)
Platelets: 273 10*3/uL (ref 150.0–400.0)
RBC: 4.72 Mil/uL (ref 3.87–5.11)
RDW: 14.2 % (ref 11.5–15.5)
WBC: 3.8 10*3/uL — ABNORMAL LOW (ref 4.0–10.5)

## 2022-12-13 LAB — URINE CULTURE
MICRO NUMBER:: 14923948
SPECIMEN QUALITY:: ADEQUATE

## 2022-12-13 LAB — HEMOGLOBIN A1C: Hgb A1c MFr Bld: 6.1 % (ref 4.6–6.5)

## 2022-12-14 DIAGNOSIS — M5415 Radiculopathy, thoracolumbar region: Secondary | ICD-10-CM | POA: Diagnosis not present

## 2022-12-14 DIAGNOSIS — M9903 Segmental and somatic dysfunction of lumbar region: Secondary | ICD-10-CM | POA: Diagnosis not present

## 2022-12-14 DIAGNOSIS — M5451 Vertebrogenic low back pain: Secondary | ICD-10-CM | POA: Diagnosis not present

## 2022-12-14 DIAGNOSIS — M9901 Segmental and somatic dysfunction of cervical region: Secondary | ICD-10-CM | POA: Diagnosis not present

## 2022-12-15 ENCOUNTER — Ambulatory Visit (INDEPENDENT_AMBULATORY_CARE_PROVIDER_SITE_OTHER): Payer: Medicare Other | Admitting: Family Medicine

## 2022-12-15 VITALS — BP 118/61 | HR 60 | Temp 97.7°F | Resp 18

## 2022-12-15 DIAGNOSIS — R399 Unspecified symptoms and signs involving the genitourinary system: Secondary | ICD-10-CM

## 2022-12-15 LAB — U/A AUTO DIPSTICK ONLY, ONSITE
Bilirubin, Urine: NEGATIVE
Glucose, Urine: NEGATIVE mg/dL
Ketones, URN: NEGATIVE mg/dL
Nitrite, URN: NEGATIVE
Occult Blood, URN: NEGATIVE
Protein: NEGATIVE mg/dL
Specific Gravity, Urine: 1.025 (ref 1.005–1.030)
Urobilinogen, URN: 0.2 E.U./dL (ref 0.2–1.0)
pH, URN: 6.5 (ref 5.0–8.0)

## 2022-12-15 NOTE — Progress Notes (Signed)
Chief Complaint   Patient presents with    Urinary Tract Infection     Wants to make sure UTI is cleared up        Subjective:     Destiny Osborne is a 77 year old female who presents today to the [facility] for follow up of UTI symptoms that occurred last week.  She was seen at the [facility] with symptoms and received IV levaquin, followed by cipro that she completed yesterday.  Symptoms had resolved as of yesterday.    Final culture showed E Coli with sensitivity to cipro.  Denies all UTI symptoms including dysuria, frequency and change to appearance of urine.    Review of Systems   Constitutional:  Negative for chills and fever.   Genitourinary: Negative.       Objective:   PHYSICAL EXAM:  BP 118/61   Pulse 60   Temp 36.5 C (Temporal)   Resp 18   SpO2 98%   General: healthy, no distress, alert and oriented x 3  Lungs: Normal WOB.     Office Visit on 12/15/22   1. POC Urine Dipstick, Automated   Result Value Ref Range    Color, Urine DARK YELLOW     Clarity, URN CLOUDY     Glucose, Urine NEG NEG mg/dL    Bilirubin, Urine NEG NEG    Ketones, URN NEG NEG mg/dL    Specific Gravity, Urine 1.025 1.005 - 1.030    Occult Blood, URN NEG NEG    pH, URN 6.5 5.0 - 8.0    Protein NEG NEG-TRACE mg/dL    Urobilinogen, URN 0.2 0.2 - 1.0 E.U./dL    Nitrite, URN NEG NEG    Leukocytes TRACE (A) NEG       Assessment and Plan:     (R39.9) UTI symptoms  (primary encounter diagnosis)  Follow-up after UTI, Cipro was completed yesterday and symptoms were fully resolved as of yesterday.  Today's urine shows only trace leukocytes.  Will send urine for culture and advise her by phone of results, number confirmed.  Continue with increase p.o. fluids.        Active Ambulatory Problems     Diagnosis Date Noted    No Active Ambulatory Problems     Resolved Ambulatory Problems     Diagnosis Date Noted    No Resolved Ambulatory Problems     No Additional Past Medical History       Patient's Medications    New Prescriptions    No medications on file   Previous Medications    DILTIAZEM ER 180 MG 24 HR CAPSULE    Take 1 capsule (180 mg) by mouth.    ISOSORBIDE MONONITRATE ER 30 MG 24 HR TABLET    Take 1 tablet (30 mg) by mouth.    LANSOPRAZOLE 30 MG DR CAPSULE    Take 1 capsule (30 mg) by mouth.    LEVOTHYROXINE 75 MCG TABLET    Take 1 tablet (75 mcg) by mouth.    METOPROLOL SUCCINATE ER 50 MG 24 HR TABLET    Take 1 tablet (50 mg) by mouth.    ONDANSETRON 4 MG DISINTEGRATING TABLET    Dissolve 1 tablet (4 mg) on top of tongue and swallow every 12 hours as needed for nausea/vomiting.    ROSUVASTATIN 5 MG TABLET    Take 1 tablet (5 mg) by mouth.   Modified Medications    No medications on file  Discontinued Medications    No medications on file       Review of patient's allergies indicates:  Allergies   Allergen Reactions    Cefdinir Skin: Hives, Skin: Rash and Resp: Shortness of breath    Cephalosporins Skin: Hives and Resp: Shortness of breath    Penicillins Skin: Hives, Skin: Rash and Resp: Shortness of breath    Vancomycin Anaphylaxis and Palpitations    Tramadol Skin: Itching

## 2022-12-15 NOTE — Addendum Note (Signed)
Addended by: Jodelle Gross on: 12/15/2022 07:15 PM     Modules accepted: Level of Service

## 2022-12-16 LAB — URINE C/S

## 2022-12-17 LAB — URINE C/S: Culture: 11000 — AB

## 2022-12-17 NOTE — Result Encounter Note (Signed)
Not on antibiotics for UTI

## 2022-12-21 ENCOUNTER — Telehealth (INDEPENDENT_AMBULATORY_CARE_PROVIDER_SITE_OTHER): Payer: Self-pay

## 2022-12-21 NOTE — Telephone Encounter (Signed)
RETURN CALL: Voicemail - Detailed Message      SUBJECT:  Results     TYPE OF TEST: Urine culture  DATE OF TEST: 12/15/2022  ADDITIONAL INFORMATION: PT is looking to get the results of urine culture done on 12/15/2022 at the clinic. Looking for a call back.

## 2022-12-21 NOTE — Telephone Encounter (Signed)
Please review and advice

## 2022-12-22 ENCOUNTER — Encounter (INDEPENDENT_AMBULATORY_CARE_PROVIDER_SITE_OTHER): Payer: Self-pay | Admitting: Family Medicine

## 2022-12-22 NOTE — Telephone Encounter (Signed)
Replied to message

## 2022-12-22 NOTE — Telephone Encounter (Signed)
RETURN CALL: Voicemail - Detailed Message        SUBJECT:  Results      TYPE OF TEST: Urine culture  DATE OF TEST: 12/15/2022  ADDITIONAL INFORMATION: PT is looking to get the results of urine culture done on 12/15/2022 at the clinic. Looking for a call back.     Patient is calling in regards to this above messages as the results were attached to this message thread but she never received a a call regarding these results and so she would like to hear back from someone in regards to this

## 2022-12-24 NOTE — Telephone Encounter (Signed)
Replied on another encounter.

## 2022-12-31 NOTE — Progress Notes (Signed)
Patient Care Team: Mliss Sax, MD as PCP - General (Family Medicine) Pershing Proud, RN as Oncology Nurse Navigator Donnelly Angelica, RN as Oncology Nurse Navigator Ovidio Kin, MD as Consulting Physician (General Surgery) Serena Croissant, MD as Consulting Physician (Hematology and Oncology) Lonie Peak, MD as Attending Physician (Radiation Oncology)  DIAGNOSIS:  Encounter Diagnosis  Name Primary?   Ductal carcinoma in situ (DCIS) of left breast Yes    SUMMARY OF ONCOLOGIC HISTORY: Oncology History  Ductal carcinoma in situ (DCIS) of left breast  07/28/2019 Initial Diagnosis   Routine screening mammogram detected a 1.9cm group of calcifications in the left breast. Biopsy showed DCIS, high grade, ER+ 95%, PR+ 90%.   07/30/2019 Cancer Staging   Staging form: Breast, AJCC 8th Edition - Clinical: Stage 0 (cTis (DCIS), cN0, cM0, G3, ER+, PR+, HER2: Not Assessed)    08/12/2019 Genetic Testing   Negative genetic testing. No pathogenic variants identified, VUS in MSH2 Gain (Exons 11-16) identified on the [facility] Common Hereditary Cancers Panel + Breast Cancer STAT Panel. The STAT Breast cancer panel offered by [facility] includes sequencing and rearrangement analysis for the following 9 genes:  ATM, BRCA1, BRCA2, CDH1, CHEK2, PALB2, PTEN, STK11 and TP53.  The Common Hereditary Cancers Panel offered by [facility] includes sequencing and/or deletion duplication testing of the following 47 genes: APC, ATM, AXIN2, BARD1, BMPR1A, BRCA1, BRCA2, BRIP1, CDH1, CDKN2A (p14ARF), CDKN2A (p16INK4a), CKD4, CHEK2, CTNNA1, DICER1, EPCAM (Deletion/duplication testing only), GREM1 (promoter region deletion/duplication testing only), KIT, MEN1, MLH1, MSH2, MSH3, MSH6, MUTYH, NBN, NF1, NHTL1, PALB2, PDGFRA, PMS2, POLD1, POLE, PTEN, RAD50, RAD51C, RAD51D, SDHB, SDHC, SDHD, SMAD4, SMARCA4. STK11, TP53, TSC1, TSC2, and VHL.  The following genes were evaluated for sequence changes only: SDHA and HOXB13 c.251G>A  variant only. The report date is 08/12/2019.   08/12/2019 Cancer Staging   Staging form: Breast, AJCC 8th Edition - Pathologic stage from 08/12/2019: Stage 0 (pTis (DCIS), pN0, cM0, ER+, PR+)   08/12/2019 Surgery   Left Breast Lumpectomy Ezzard Standing) [phone]): high grade DCIS with calcifications and necrosis, spanning 1.5cm. Additional left lateral, medial, and superior magrins showed fibrocystic changes. No regional lymph nodes were examined. ER+ PR+.   09/10/2019 - 10/07/2019 Radiation Therapy   The patient initially received a dose of 40.05 Gy in 15 fractions to the breast using whole-breast tangent fields. This was delivered using a 3-D conformal technique. The pt received a boost delivering an additional 10 Gy in 5 fractions using a electron boost with electrons. The total dose was 50.05 Gy.   10/2019 - 10/2024 Anti-estrogen oral therapy   Tamoxifen     CHIEF COMPLIANT: Follow-up of left breast DCIS on tamoxifen    INTERVAL HISTORY: Brenda Delgado is a 77 y.o. with above-mentioned history of left breast DCIS for which she underwent a lumpectomy, radiation, and is currently on antiestrogen therapy with tamoxifen. She presents to the clinic today for follow-up. She reports that she is tolerating the tamoxifen fairly well. She does have some mild pain on both sides of breast.   ALLERGIES:  is allergic to pravastatin, erythromycin, iohexol, amoxicillin-pot clavulanate, codeine, moxifloxacin, and sulfamethoxazole.  MEDICATIONS:  Current Outpatient Medications  Medication Sig Dispense Refill   ALBUTEROL IN Inhale into the lungs as needed.     Apoaequorin (PREVAGEN PO) Take by mouth.     ARNUITY ELLIPTA 100 MCG/ACT AEPB Take 1 puff by mouth daily.     Ascorbic Acid (VITAMIN C) 1000 MG tablet Take 1,000 mg by mouth  daily.     aspirin EC 81 MG tablet Take 81 mg by mouth daily.     Calcium-Vitamin D-Vitamin K 500-500-40 MG-UNT-MCG CHEW Chew 2 tablets by mouth daily.     Cholecalciferol  (VITAMIN D3) 50 MCG (2000 UT) TABS Take 50 mcg by mouth 2 (two) times daily.     Coenzyme Q10 (COQ10 PO) Take 1 capsule by mouth daily.     EPINEPHrine 0.3 mg/0.3 mL IJ SOAJ injection SMARTSIG:1 Pre-Filled Pen Syringe IM As Needed     FLOVENT HFA 110 MCG/ACT inhaler SMARTSIG:2 Puff(s) By Mouth Twice Daily     meloxicam (MOBIC) 15 MG tablet Take 15 mg by mouth daily as needed for pain.     mometasone (NASONEX) 50 MCG/ACT nasal spray Place 2 sprays into the nose daily. 1 each 12   Multiple Vitamins-Minerals (PRESERVISION AREDS 2) CAPS Take 2 capsules by mouth daily.     omega-3 acid ethyl esters (LOVAZA) 1 g capsule Take by mouth 2 (two) times daily.     pantoprazole (PROTONIX) 40 MG tablet Take 1 tablet (40 mg total) by mouth at bedtime. 90 tablet 3   PARoxetine (PAXIL) 20 MG tablet Take 1 tablet (20 mg total) by mouth daily. 90 tablet 3   zinc gluconate 50 MG tablet Take 50 mg by mouth daily.     tamoxifen (NOLVADEX) 10 MG tablet Take 1 tablet (10 mg total) by mouth daily. 90 tablet 4   No current facility-administered medications for this visit.    PHYSICAL EXAMINATION: ECOG PERFORMANCE STATUS: 1 - Symptomatic but completely ambulatory  Vitals:   01/08/23 1018  BP: 102/71  Pulse: 87  Resp: 18  Temp: 97.9 F (36.6 C)  SpO2: 96%   Filed Weights   01/08/23 1018  Weight: 130 lb 12.8 oz (59.3 kg)    BREAST: No palpable masses or nodules in either right or left breasts. No palpable axillary supraclavicular or infraclavicular adenopathy no breast tenderness or nipple discharge. (exam performed in the presence of a chaperone)  LABORATORY DATA:  I have reviewed the data as listed    Latest Ref Rng & Units 12/12/2022    2:59 PM 11/10/2020    9:33 AM 02/02/2020    3:21 PM  CMP  Glucose 70 - 99 mg/dL 956  94  85   BUN 6 - 23 mg/dL 19  15  19    Creatinine 0.40 - 1.20 mg/dL 2.13  0.86  5.78   Sodium 135 - 145 mEq/L 142  140  141   Potassium 3.5 - 5.1 mEq/L 4.2  4.4  4.8   Chloride 96 -  112 mEq/L 103  103  103   CO2 19 - 32 mEq/L 30  32  32   Calcium 8.4 - 10.5 mg/dL 8.9  9.1  9.2   Total Protein 6.0 - 8.3 g/dL  6.2  6.5   Total Bilirubin 0.2 - 1.2 mg/dL  0.5  0.3   Alkaline Phos 39 - 117 U/L  51  49   AST 0 - 37 U/L  19  19   ALT 0 - 35 U/L  13  12     Lab Results  Component Value Date   WBC 3.8 (L) 12/12/2022   HGB 13.9 12/12/2022   HCT 43.2 12/12/2022   MCV 91.6 12/12/2022   PLT 273.0 12/12/2022   NEUTROABS 1.7 07/30/2019    ASSESSMENT & PLAN:  Ductal carcinoma in situ (DCIS) of left breast 07/28/2019:Routine screening mammogram detected  a 1.9cm group of calcifications in the left breast. Biopsy showed DCIS, high grade, ER+ 95%, PR+ 90%. Tis NX stage 0 08/12/2019: Left lumpectomy: High-grade DCIS with necrosis and calcifications 1.5 cm, margins negative, ER 95%, PR 90%   Treatment plan: 1.  Adjuvant radiation therapy 09/11/2019-10/06/2019 2.  Follow-up adjuvant antiestrogen therapy with tamoxifen x5 years started 10/19/20 ------------------------------------------------------------------------------------------------------------------------ Tamoxifen Toxicities: Tolerating it extremely well without any problems or concerns Denies any hot flashes or arthralgias or myalgias.   Multiple drainage procedures for seroma: Resolved   Breast Cancer Surveillance: 1. Breast Exam: 01/08/2023: Benign, breast does not appear to be tender today.  She had the tenderness started about 1 month ago and it has subsided currently. 2. Mammogram: 06/19/2022: [facility] Benign breast density category A no abnormalities detected at the site of breast pain.     Return to clinic in 1 year for follow-up    No orders of the defined types were placed in this encounter.  The patient has a good understanding of the overall plan. she agrees with it. she will call with any problems that may develop before the next visit here. Total time spent: 30 mins including face to face time and time spent  for planning, charting and co-ordination of care   Tamsen Meek, MD 01/08/23    I Janan Ridge am acting as a Neurosurgeon for [employer]  I have reviewed the above documentation for accuracy and completeness, and I agree with the above.

## 2023-01-08 ENCOUNTER — Other Ambulatory Visit: Payer: Self-pay

## 2023-01-08 ENCOUNTER — Inpatient Hospital Stay: Payer: Medicare Other | Attending: Hematology and Oncology | Admitting: Hematology and Oncology

## 2023-01-08 VITALS — BP 102/71 | HR 87 | Temp 97.9°F | Resp 18 | Ht <= 58 in | Wt 130.8 lb

## 2023-01-08 DIAGNOSIS — Z923 Personal history of irradiation: Secondary | ICD-10-CM | POA: Insufficient documentation

## 2023-01-08 DIAGNOSIS — Z79810 Long term (current) use of selective estrogen receptor modulators (SERMs): Secondary | ICD-10-CM | POA: Insufficient documentation

## 2023-01-08 DIAGNOSIS — D0512 Intraductal carcinoma in situ of left breast: Secondary | ICD-10-CM | POA: Diagnosis not present

## 2023-01-08 MED ORDER — TAMOXIFEN CITRATE 10 MG PO TABS: 10.0000 mg | ORAL_TABLET | Freq: Every day | ORAL | 4 refills | Status: DC

## 2023-01-08 NOTE — Assessment & Plan Note (Signed)
07/28/2019:Routine screening mammogram detected a 1.9cm group of calcifications in the left breast. Biopsy showed DCIS, high grade, ER+ 95%, PR+ 90%. Tis NX stage 0 08/12/2019: Left lumpectomy: High-grade DCIS with necrosis and calcifications 1.5 cm, margins negative, ER 95%, PR 90%   Treatment plan: 1.  Adjuvant radiation therapy 09/11/2019-10/06/2019 2.  Follow-up adjuvant antiestrogen therapy with tamoxifen x5 years started 10/19/20 ------------------------------------------------------------------------------------------------------------------------ Tamoxifen Toxicities: Tolerating it extremely well without any problems or concerns Denies any hot flashes or arthralgias or myalgias.   Multiple drainage procedures for seroma: Resolved   Breast Cancer Surveillance: 1. Breast Exam: 01/08/2023: Benign, breast does not appear to be tender today.  She had the tenderness started about 1 month ago and it has subsided currently. 2. Mammogram: 06/19/2022: [facility] Benign breast density category A no abnormalities detected at the site of breast pain.     Return to clinic in 1 year for follow-up

## 2023-01-18 DIAGNOSIS — M5415 Radiculopathy, thoracolumbar region: Secondary | ICD-10-CM | POA: Diagnosis not present

## 2023-01-18 DIAGNOSIS — M9903 Segmental and somatic dysfunction of lumbar region: Secondary | ICD-10-CM | POA: Diagnosis not present

## 2023-01-18 DIAGNOSIS — M5451 Vertebrogenic low back pain: Secondary | ICD-10-CM | POA: Diagnosis not present

## 2023-01-18 DIAGNOSIS — M9901 Segmental and somatic dysfunction of cervical region: Secondary | ICD-10-CM | POA: Diagnosis not present

## 2023-02-05 DIAGNOSIS — M5451 Vertebrogenic low back pain: Secondary | ICD-10-CM | POA: Diagnosis not present

## 2023-02-05 DIAGNOSIS — M5415 Radiculopathy, thoracolumbar region: Secondary | ICD-10-CM | POA: Diagnosis not present

## 2023-02-05 DIAGNOSIS — M9901 Segmental and somatic dysfunction of cervical region: Secondary | ICD-10-CM | POA: Diagnosis not present

## 2023-02-05 DIAGNOSIS — M9903 Segmental and somatic dysfunction of lumbar region: Secondary | ICD-10-CM | POA: Diagnosis not present

## 2023-02-22 DIAGNOSIS — M5451 Vertebrogenic low back pain: Secondary | ICD-10-CM | POA: Diagnosis not present

## 2023-02-22 DIAGNOSIS — M9901 Segmental and somatic dysfunction of cervical region: Secondary | ICD-10-CM | POA: Diagnosis not present

## 2023-02-22 DIAGNOSIS — M9903 Segmental and somatic dysfunction of lumbar region: Secondary | ICD-10-CM | POA: Diagnosis not present

## 2023-02-22 DIAGNOSIS — M5415 Radiculopathy, thoracolumbar region: Secondary | ICD-10-CM | POA: Diagnosis not present

## 2023-03-06 DIAGNOSIS — M5415 Radiculopathy, thoracolumbar region: Secondary | ICD-10-CM | POA: Diagnosis not present

## 2023-03-06 DIAGNOSIS — M9901 Segmental and somatic dysfunction of cervical region: Secondary | ICD-10-CM | POA: Diagnosis not present

## 2023-03-06 DIAGNOSIS — M9903 Segmental and somatic dysfunction of lumbar region: Secondary | ICD-10-CM | POA: Diagnosis not present

## 2023-03-06 DIAGNOSIS — M5451 Vertebrogenic low back pain: Secondary | ICD-10-CM | POA: Diagnosis not present

## 2023-03-08 DIAGNOSIS — H04123 Dry eye syndrome of bilateral lacrimal glands: Secondary | ICD-10-CM | POA: Diagnosis not present

## 2023-03-08 DIAGNOSIS — H524 Presbyopia: Secondary | ICD-10-CM | POA: Diagnosis not present

## 2023-03-08 DIAGNOSIS — M9901 Segmental and somatic dysfunction of cervical region: Secondary | ICD-10-CM | POA: Diagnosis not present

## 2023-03-08 DIAGNOSIS — H43813 Vitreous degeneration, bilateral: Secondary | ICD-10-CM | POA: Diagnosis not present

## 2023-03-08 DIAGNOSIS — M9903 Segmental and somatic dysfunction of lumbar region: Secondary | ICD-10-CM | POA: Diagnosis not present

## 2023-03-08 DIAGNOSIS — H52223 Regular astigmatism, bilateral: Secondary | ICD-10-CM | POA: Diagnosis not present

## 2023-03-08 DIAGNOSIS — M5451 Vertebrogenic low back pain: Secondary | ICD-10-CM | POA: Diagnosis not present

## 2023-03-08 DIAGNOSIS — H353132 Nonexudative age-related macular degeneration, bilateral, intermediate dry stage: Secondary | ICD-10-CM | POA: Diagnosis not present

## 2023-03-08 DIAGNOSIS — M5415 Radiculopathy, thoracolumbar region: Secondary | ICD-10-CM | POA: Diagnosis not present

## 2023-03-12 DIAGNOSIS — M5451 Vertebrogenic low back pain: Secondary | ICD-10-CM | POA: Diagnosis not present

## 2023-03-12 DIAGNOSIS — M5415 Radiculopathy, thoracolumbar region: Secondary | ICD-10-CM | POA: Diagnosis not present

## 2023-03-12 DIAGNOSIS — M9901 Segmental and somatic dysfunction of cervical region: Secondary | ICD-10-CM | POA: Diagnosis not present

## 2023-03-12 DIAGNOSIS — M9903 Segmental and somatic dysfunction of lumbar region: Secondary | ICD-10-CM | POA: Diagnosis not present

## 2023-03-15 ENCOUNTER — Ambulatory Visit: Payer: BC Managed Care – PPO | Admitting: Family Medicine

## 2023-03-15 DIAGNOSIS — M5451 Vertebrogenic low back pain: Secondary | ICD-10-CM | POA: Diagnosis not present

## 2023-03-15 DIAGNOSIS — M9903 Segmental and somatic dysfunction of lumbar region: Secondary | ICD-10-CM | POA: Diagnosis not present

## 2023-03-15 DIAGNOSIS — M5415 Radiculopathy, thoracolumbar region: Secondary | ICD-10-CM | POA: Diagnosis not present

## 2023-03-15 DIAGNOSIS — M9901 Segmental and somatic dysfunction of cervical region: Secondary | ICD-10-CM | POA: Diagnosis not present

## 2023-03-22 DIAGNOSIS — M5451 Vertebrogenic low back pain: Secondary | ICD-10-CM | POA: Diagnosis not present

## 2023-03-22 DIAGNOSIS — M9903 Segmental and somatic dysfunction of lumbar region: Secondary | ICD-10-CM | POA: Diagnosis not present

## 2023-03-22 DIAGNOSIS — M9901 Segmental and somatic dysfunction of cervical region: Secondary | ICD-10-CM | POA: Diagnosis not present

## 2023-03-22 DIAGNOSIS — M5415 Radiculopathy, thoracolumbar region: Secondary | ICD-10-CM | POA: Diagnosis not present

## 2023-04-18 ENCOUNTER — Encounter: Payer: Self-pay | Admitting: Family Medicine

## 2023-04-18 ENCOUNTER — Ambulatory Visit (INDEPENDENT_AMBULATORY_CARE_PROVIDER_SITE_OTHER): Payer: Medicare Other | Admitting: Family Medicine

## 2023-04-18 VITALS — BP 116/72 | HR 70 | Temp 98.6°F | Wt 127.6 lb

## 2023-04-18 DIAGNOSIS — Z780 Asymptomatic menopausal state: Secondary | ICD-10-CM

## 2023-04-18 DIAGNOSIS — R7303 Prediabetes: Secondary | ICD-10-CM

## 2023-04-18 DIAGNOSIS — Z0001 Encounter for general adult medical examination with abnormal findings: Secondary | ICD-10-CM | POA: Diagnosis not present

## 2023-04-18 DIAGNOSIS — E7800 Pure hypercholesterolemia, unspecified: Secondary | ICD-10-CM

## 2023-04-18 DIAGNOSIS — R251 Tremor, unspecified: Secondary | ICD-10-CM | POA: Diagnosis not present

## 2023-04-18 DIAGNOSIS — Z0000 Encounter for general adult medical examination without abnormal findings: Secondary | ICD-10-CM

## 2023-04-18 LAB — COMPREHENSIVE METABOLIC PANEL
ALT: 14 U/L (ref 0–35)
AST: 21 U/L (ref 0–37)
Albumin: 3.7 g/dL (ref 3.5–5.2)
Alkaline Phosphatase: 50 U/L (ref 39–117)
BUN: 18 mg/dL (ref 6–23)
CO2: 31 meq/L (ref 19–32)
Calcium: 8.9 mg/dL (ref 8.4–10.5)
Chloride: 103 meq/L (ref 96–112)
Creatinine, Ser: 0.68 mg/dL (ref 0.40–1.20)
GFR: 84.47 mL/min (ref >60.00)
Glucose, Bld: 95 mg/dL (ref 70–99)
Potassium: 4.3 meq/L (ref 3.5–5.1)
Sodium: 141 meq/L (ref 135–145)
Total Bilirubin: 0.6 mg/dL (ref 0.2–1.2)
Total Protein: 6 g/dL (ref 6.0–8.3)

## 2023-04-18 LAB — URINALYSIS, ROUTINE W REFLEX MICROSCOPIC
Bilirubin Urine: NEGATIVE
Hgb urine dipstick: NEGATIVE
Ketones, ur: NEGATIVE
Leukocytes,Ua: NEGATIVE
Nitrite: NEGATIVE
Specific Gravity, Urine: 1.02 (ref 1.000–1.030)
Total Protein, Urine: NEGATIVE
Urine Glucose: NEGATIVE
Urobilinogen, UA: 1 (ref 0.0–1.0)
pH: 6.5 (ref 5.0–8.0)

## 2023-04-18 LAB — LIPID PANEL
Cholesterol: 163 mg/dL (ref 0–200)
HDL: 60.5 mg/dL (ref >39.00)
LDL Cholesterol: 85 mg/dL (ref 0–99)
NonHDL: 102.8
Total CHOL/HDL Ratio: 3
Triglycerides: 88 mg/dL (ref 0.0–149.0)
VLDL: 17.6 mg/dL (ref 0.0–40.0)

## 2023-04-18 LAB — CBC
HCT: 42.8 % (ref 36.0–46.0)
Hemoglobin: 13.7 g/dL (ref 12.0–15.0)
MCHC: 31.9 g/dL (ref 30.0–36.0)
MCV: 93.8 fl (ref 78.0–100.0)
Platelets: 274 10*3/uL (ref 150.0–400.0)
RBC: 4.56 Mil/uL (ref 3.87–5.11)
RDW: 14.4 % (ref 11.5–15.5)
WBC: 3.2 10*3/uL — ABNORMAL LOW (ref 4.0–10.5)

## 2023-04-18 LAB — HEMOGLOBIN A1C: Hgb A1c MFr Bld: 6 % (ref 4.6–6.5)

## 2023-04-18 NOTE — Progress Notes (Addendum)
 Established Patient Office Visit   Subjective:  Patient ID: Brenda Delgado, female    DOB: 26-Oct-1945  Age: 77 y.o. MRN: [id number]  Chief Complaint  Patient presents with   Medical Management of Chronic Issues    Body shakes worsening. Would like to discuss sugar free recommendations for sweetening tea.     HPI Encounter Diagnoses  Name Primary?   Healthcare maintenance Yes   Tremor of face and hands    Elevated LDL cholesterol level    Prediabetes    Postmenopausal estrogen deficiency    For follow-up of above annual physical.  Fasting this morning accompanied by her husband.  No regular exercise.  She has decreased simple carbohydrates in her diet.  Longstanding history of tremor involving her head and both hands.  She has tried B complex for this.   Review of Systems  Constitutional: Negative.   HENT: Negative.    Eyes:  Negative for blurred vision, discharge and redness.  Respiratory: Negative.    Cardiovascular: Negative.   Gastrointestinal:  Negative for abdominal pain.  Genitourinary: Negative.   Musculoskeletal: Negative.  Negative for myalgias.  Skin:  Negative for rash.  Neurological:  Positive for tremors. Negative for tingling, loss of consciousness and weakness.  Endo/Heme/Allergies:  Negative for polydipsia.      04/18/2023    8:37 AM 12/12/2022    2:08 PM 12/11/2022    1:42 PM  Depression screen PHQ 2/9  Decreased Interest 0 0 0  Down, Depressed, Hopeless 0 0 0  PHQ - 2 Score 0 0 0  Altered sleeping 1    Tired, decreased energy 1    Change in appetite 0    Feeling bad or failure about yourself  0    Trouble concentrating 0    Moving slowly or fidgety/restless 0    Suicidal thoughts 0    PHQ-9 Score 2    Difficult doing work/chores Not difficult at all         Current Outpatient Medications:    ALBUTEROL IN, Inhale into the lungs as needed., Disp: , Rfl:    Apoaequorin (PREVAGEN PO), Take by mouth. (Patient not taking: Reported on 01/08/2024),  Disp: , Rfl:    Ascorbic Acid (VITAMIN C) 1000 MG tablet, Take 1,000 mg by mouth daily., Disp: , Rfl:    aspirin EC 81 MG tablet, Take 81 mg by mouth daily., Disp: , Rfl:    Calcium-Vitamin D-Vitamin K 500-500-40 MG-UNT-MCG CHEW, Chew 2 tablets by mouth daily., Disp: , Rfl:    Cholecalciferol (VITAMIN D3) 50 MCG (2000 UT) TABS, Take 50 mcg by mouth 2 (two) times daily., Disp: , Rfl:    Coenzyme Q10 (COQ10 PO), Take 1 capsule by mouth daily., Disp: , Rfl:    EPINEPHrine  0.3 mg/0.3 mL IJ SOAJ injection, SMARTSIG:1 Pre-Filled Pen Syringe IM As Needed, Disp: , Rfl:    meloxicam (MOBIC) 15 MG tablet, Take 15 mg by mouth daily as needed for pain., Disp: , Rfl:    mometasone  (NASONEX ) 50 MCG/ACT nasal spray, Place 2 sprays into the nose daily. (Patient not taking: Reported on 01/08/2024), Disp: 1 each, Rfl: 12   omega-3 acid ethyl esters (LOVAZA) 1 g capsule, Take by mouth 2 (two) times daily., Disp: , Rfl:    zinc gluconate 50 MG tablet, Take 50 mg by mouth daily., Disp: , Rfl:    metFORMIN  (GLUCOPHAGE -XR) 500 MG 24 hr tablet, Take 1 tablet (500 mg total) by mouth at bedtime., Disp: 90 tablet,  Rfl: 1   mometasone  (ASMANEX ) 220 MCG/ACT inhaler, Inhale 2 puffs into the lungs daily. (Patient not taking: Reported on 01/08/2024), Disp: 1 each, Rfl: 5   Multiple Vitamins-Minerals (OCUVITE PO), Take by mouth., Disp: , Rfl:    pantoprazole  (PROTONIX ) 40 MG tablet, TAKE 1 TABLET BY MOUTH AT BEDTIME, Disp: 90 tablet, Rfl: 0   PARoxetine  (PAXIL ) 20 MG tablet, Take 1 tablet by mouth once daily, Disp: 90 tablet, Rfl: 0   tamoxifen  (NOLVADEX ) 10 MG tablet, Take 1 tablet (10 mg total) by mouth daily., Disp: 90 tablet, Rfl: 3   Turmeric 400 MG CAPS, Take by mouth., Disp: , Rfl:    Objective:     BP 116/72 (BP Location: Right Arm, Patient Position: Sitting, Cuff Size: Large)   Pulse 70   Temp 98.6 F (37 C) (Temporal)   Wt 127 lb 9.6 oz (57.9 kg)   SpO2 98%   BMI 26.67 kg/m  Wt Readings from Last 3 Encounters:   01/08/24 118 lb (53.5 kg)  12/10/23 118 lb 6.4 oz (53.7 kg)  11/16/23 118 lb 12.8 oz (53.9 kg)      Physical Exam Constitutional:      General: She is not in acute distress.    Appearance: Normal appearance. She is not ill-appearing, toxic-appearing or diaphoretic.  HENT:     Head: Normocephalic and atraumatic.     Right Ear: External ear normal.     Left Ear: External ear normal.     Mouth/Throat:     Mouth: Mucous membranes are moist.     Pharynx: Oropharynx is clear. No oropharyngeal exudate or posterior oropharyngeal erythema.  Eyes:     General: No scleral icterus.       Right eye: No discharge.        Left eye: No discharge.     Extraocular Movements: Extraocular movements intact.     Conjunctiva/sclera: Conjunctivae normal.     Pupils: Pupils are equal, round, and reactive to light.  Cardiovascular:     Rate and Rhythm: Normal rate and regular rhythm.  Pulmonary:     Effort: Pulmonary effort is normal. No respiratory distress.     Breath sounds: Normal breath sounds.  Abdominal:     General: Bowel sounds are normal.  Musculoskeletal:     Cervical back: No rigidity or tenderness.  Lymphadenopathy:     Cervical: No cervical adenopathy.  Skin:    General: Skin is warm and dry.  Neurological:     Mental Status: She is alert and oriented to person, place, and time.  Psychiatric:        Mood and Affect: Mood normal.        Behavior: Behavior normal.      Results for orders placed or performed in visit on 04/18/23  CBC  Result Value Ref Range   WBC 3.2 (L) 4.0 - 10.5 K/uL   RBC 4.56 3.87 - 5.11 Mil/uL   Platelets 274.0 150.0 - 400.0 K/uL   Hemoglobin 13.7 12.0 - 15.0 g/dL   HCT 57.1 63.9 - 53.9 %   MCV 93.8 78.0 - 100.0 fl   MCHC 31.9 30.0 - 36.0 g/dL   RDW 85.5 88.4 - 84.4 %  Comprehensive metabolic panel  Result Value Ref Range   Sodium 141 135 - 145 mEq/L   Potassium 4.3 3.5 - 5.1 mEq/L   Chloride 103 96 - 112 mEq/L   CO2 31 19 - 32 mEq/L   Glucose,  Bld 95 70 -  99 mg/dL   BUN 18 6 - 23 mg/dL   Creatinine, Ser 9.31 0.40 - 1.20 mg/dL   Total Bilirubin 0.6 0.2 - 1.2 mg/dL   Alkaline Phosphatase 50 39 - 117 U/L   AST 21 0 - 37 U/L   ALT 14 0 - 35 U/L   Total Protein 6.0 6.0 - 8.3 g/dL   Albumin 3.7 3.5 - 5.2 g/dL   GFR 15.52 >39.99 mL/min   Calcium 8.9 8.4 - 10.5 mg/dL  Hemoglobin J8r  Result Value Ref Range   Hgb A1c MFr Bld 6.0 4.6 - 6.5 %  Lipid panel  Result Value Ref Range   Cholesterol 163 0 - 200 mg/dL   Triglycerides 11.9 0.0 - 149.0 mg/dL   HDL 39.49 >60.99 mg/dL   VLDL 82.3 0.0 - 59.9 mg/dL   LDL Cholesterol 85 0 - 99 mg/dL   Total CHOL/HDL Ratio 3    NonHDL 102.80   Urinalysis, Routine w reflex microscopic  Result Value Ref Range   Color, Urine YELLOW Yellow;Lt. Yellow;Straw;Dark Yellow;Amber;Green;Red;Brown   APPearance CLEAR Clear;Turbid;Slightly Cloudy;Cloudy   Specific Gravity, Urine 1.020 1.000 - 1.030   pH 6.5 5.0 - 8.0   Total Protein, Urine NEGATIVE Negative   Urine Glucose NEGATIVE Negative   Ketones, ur NEGATIVE Negative   Bilirubin Urine NEGATIVE Negative   Hgb urine dipstick NEGATIVE Negative   Urobilinogen, UA 1.0 0.0 - 1.0   Leukocytes,Ua NEGATIVE Negative   Nitrite NEGATIVE Negative   WBC, UA 0-2/hpf 0-2/hpf   RBC / HPF 0-2/hpf 0-2/hpf   Mucus, UA Presence of (A) None   Squamous Epithelial / HPF Few(5-10/hpf) (A) Rare(0-4/hpf)   Bacteria, UA Rare(<10/hpf) (A) None      The 10-year ASCVD risk score (Arnett DK, et al., 2019) is: 17.7%    Assessment & Plan:   Healthcare maintenance -     CBC -     Urinalysis, Routine w reflex microscopic  Tremor of face and hands  Elevated LDL cholesterol level -     Comprehensive metabolic panel with GFR -     Lipid panel  Prediabetes -     Comprehensive metabolic panel with GFR -     Hemoglobin A1c  Postmenopausal estrogen deficiency -     DG Bone Density; Future    Return in about 6 months (around 10/16/2023).  Information was given on  health maintenance and disease prevention.  Information was given on prediabetes and prediabetes eating plan.  Patient was referred for diabetic teaching.  Information was given on metformin .  We discussed using it.  Advised exercising for 30 minutes such as walking daily.  Lots of information in there for them to look at [facility] information was given on essential tremor.  Could consider a neurology referral.  Elsie Sim Lent, MD  9/12 addendum: Adding metformin  to be taken nightly as discussed above.

## 2023-04-19 MED ORDER — METFORMIN HCL ER 500 MG PO TB24
500.0000 mg | ORAL_TABLET | Freq: Every evening | ORAL | 1 refills | Status: DC
Start: 2023-04-19 — End: 2023-08-17

## 2023-04-19 NOTE — Addendum Note (Signed)
Addended by: Nadene Rubins A on: 04/19/2023 10:10 AM   Modules accepted: Orders

## 2023-04-26 DIAGNOSIS — M9903 Segmental and somatic dysfunction of lumbar region: Secondary | ICD-10-CM | POA: Diagnosis not present

## 2023-04-26 DIAGNOSIS — M9901 Segmental and somatic dysfunction of cervical region: Secondary | ICD-10-CM | POA: Diagnosis not present

## 2023-04-26 DIAGNOSIS — M5451 Vertebrogenic low back pain: Secondary | ICD-10-CM | POA: Diagnosis not present

## 2023-04-26 DIAGNOSIS — M5415 Radiculopathy, thoracolumbar region: Secondary | ICD-10-CM | POA: Diagnosis not present

## 2023-05-22 ENCOUNTER — Telehealth: Payer: Self-pay

## 2023-05-22 NOTE — Patient Outreach (Signed)
Care Coordination   05/22/2023 Name: Brenda Delgado MRN: [id number] DOB: 09/29/1945   Care Coordination Outreach Attempts:  An unsuccessful telephone outreach was attempted today to offer the patient information about available care coordination services.  Follow Up Plan:  Additional outreach attempts will be made to offer the patient care coordination information and services.   Encounter Outcome:  No Answer   Care Coordination Interventions:  No, not indicated    Bary Leriche, RN, MSN [facility]  [facility], [facility] Management Community Coordinator Direct Dial: [phone]  Fax: [phone] Website: [email or web address]

## 2023-05-24 DIAGNOSIS — M5415 Radiculopathy, thoracolumbar region: Secondary | ICD-10-CM | POA: Diagnosis not present

## 2023-05-24 DIAGNOSIS — M9901 Segmental and somatic dysfunction of cervical region: Secondary | ICD-10-CM | POA: Diagnosis not present

## 2023-05-24 DIAGNOSIS — M5451 Vertebrogenic low back pain: Secondary | ICD-10-CM | POA: Diagnosis not present

## 2023-05-24 DIAGNOSIS — M9903 Segmental and somatic dysfunction of lumbar region: Secondary | ICD-10-CM | POA: Diagnosis not present

## 2023-05-30 DIAGNOSIS — K080 Exfoliation of teeth due to systemic causes: Secondary | ICD-10-CM | POA: Diagnosis not present

## 2023-06-06 DIAGNOSIS — S46911A Strain of unspecified muscle, fascia and tendon at shoulder and upper arm level, right arm, initial encounter: Secondary | ICD-10-CM | POA: Diagnosis not present

## 2023-06-08 ENCOUNTER — Telehealth: Payer: Self-pay

## 2023-06-08 NOTE — Patient Outreach (Signed)
  Care Coordination   Follow Up Visit Note   06/08/2023 Name: Brenda Delgado MRN: [id number] DOB: 1946-03-15  Brenda Delgado is a 77 y.o. year old female who sees Mliss Sax, MD for primary care. I spoke with  [name] by phone today.  What matters to the patients health and wellness today?  none    Goals Addressed             This Visit's Progress    COMPLETED: Care Coordination Activities-No follow up required       Care Coordination Interventions: Advised patient to Annual Wellness exam. Discussed services and support. Assessed SDOH. Advised to discuss with primary care physician if services needed in the future.   Spoke with patient.  She is doing okay.  Right shoulder giving her some trouble.  She is getting cortisone shots to her shoulder.  Pain 2-3/10 using ibuprofen as needed.  She reports she has lost 6 lbs. No concerns.          SDOH assessments and interventions completed:  Yes  SDOH Interventions Today    Flowsheet Row Most Recent Value  SDOH Interventions   Food Insecurity Interventions Intervention Not Indicated  Housing Interventions Intervention Not Indicated  Transportation Interventions Intervention Not Indicated  Utilities Interventions Intervention Not Indicated  Health Literacy Interventions Intervention Not Indicated        Care Coordination Interventions:  Yes, provided   Follow up plan: No further intervention required.   Encounter Outcome:  Patient Visit Completed   Bary Leriche, RN, MSN [facility]  [facility], [facility] Management Community Coordinator Direct Dial: [phone]  Fax: [phone] Website: [email or web address]

## 2023-06-08 NOTE — Patient Instructions (Signed)
Visit Information  Thank you for taking time to visit with me today. Please don't hesitate to contact me if I can be of assistance to you.   Following are the goals we discussed today:   Goals Addressed             This Visit's Progress    COMPLETED: Care Coordination Activities-No follow up required       Care Coordination Interventions: Advised patient to Annual Wellness exam. Discussed services and support. Assessed SDOH. Advised to discuss with primary care physician if services needed in the future.   Spoke with patient.  She is doing okay.  Right shoulder giving her some trouble.  She is getting cortisone shots to her shoulder.  Pain 2-3/10 using ibuprofen as needed.  She reports she has lost 6 lbs. No concerns.          If you are experiencing a Mental Health or Behavioral Health Crisis or need someone to talk to, please call the Suicide and Crisis Lifeline: 988   Patient verbalizes understanding of instructions and care plan provided today and agrees to view in MyChart. Active MyChart status and patient understanding of how to access instructions and care plan via MyChart confirmed with patient.     The patient has been provided with contact information for the care management team and has been advised to call with any health related questions or concerns.   Bary Leriche, RN, MSN [facility], [facility] Management Community Coordinator Direct Dial: [phone]  Fax: [phone] Website: [email or web address]

## 2023-06-21 DIAGNOSIS — M5415 Radiculopathy, thoracolumbar region: Secondary | ICD-10-CM | POA: Diagnosis not present

## 2023-06-21 DIAGNOSIS — M9903 Segmental and somatic dysfunction of lumbar region: Secondary | ICD-10-CM | POA: Diagnosis not present

## 2023-06-21 DIAGNOSIS — M5451 Vertebrogenic low back pain: Secondary | ICD-10-CM | POA: Diagnosis not present

## 2023-06-21 DIAGNOSIS — M9901 Segmental and somatic dysfunction of cervical region: Secondary | ICD-10-CM | POA: Diagnosis not present

## 2023-06-26 DIAGNOSIS — Z1231 Encounter for screening mammogram for malignant neoplasm of breast: Secondary | ICD-10-CM | POA: Diagnosis not present

## 2023-06-27 ENCOUNTER — Encounter: Payer: Self-pay | Admitting: Hematology and Oncology

## 2023-07-19 DIAGNOSIS — M5415 Radiculopathy, thoracolumbar region: Secondary | ICD-10-CM | POA: Diagnosis not present

## 2023-07-19 DIAGNOSIS — M9903 Segmental and somatic dysfunction of lumbar region: Secondary | ICD-10-CM | POA: Diagnosis not present

## 2023-07-19 DIAGNOSIS — M5451 Vertebrogenic low back pain: Secondary | ICD-10-CM | POA: Diagnosis not present

## 2023-07-19 DIAGNOSIS — M9901 Segmental and somatic dysfunction of cervical region: Secondary | ICD-10-CM | POA: Diagnosis not present

## 2023-07-23 ENCOUNTER — Ambulatory Visit: Payer: BC Managed Care – PPO | Admitting: Family Medicine

## 2023-07-30 NOTE — Progress Notes (Signed)
   07/30/2023  Patient ID: Brenda Delgado, female   DOB: 05-05-1946, 77 y.o.   MRN: [id number]  Pharmacy Quality Measure Review  This patient is appearing on a report for being at risk of failing the adherence measure for diabetes medications this calendar year.   Medication: metformin XR 500mg  Last fill date: 07/15/23 for 90 day supply  Insurance report was not up to date. No action needed at this time.   Lenna Gilford, PharmD, DPLA

## 2023-08-14 DIAGNOSIS — M5451 Vertebrogenic low back pain: Secondary | ICD-10-CM | POA: Diagnosis not present

## 2023-08-14 DIAGNOSIS — M9903 Segmental and somatic dysfunction of lumbar region: Secondary | ICD-10-CM | POA: Diagnosis not present

## 2023-08-14 DIAGNOSIS — M9901 Segmental and somatic dysfunction of cervical region: Secondary | ICD-10-CM | POA: Diagnosis not present

## 2023-08-14 DIAGNOSIS — M5415 Radiculopathy, thoracolumbar region: Secondary | ICD-10-CM | POA: Diagnosis not present

## 2023-08-17 ENCOUNTER — Telehealth: Payer: Self-pay

## 2023-08-17 ENCOUNTER — Encounter: Payer: Self-pay | Admitting: Family Medicine

## 2023-08-17 ENCOUNTER — Ambulatory Visit (INDEPENDENT_AMBULATORY_CARE_PROVIDER_SITE_OTHER): Payer: Medicare Other | Admitting: Family Medicine

## 2023-08-17 VITALS — BP 118/66 | HR 85 | Temp 98.0°F | Ht <= 58 in | Wt 119.4 lb

## 2023-08-17 DIAGNOSIS — R7303 Prediabetes: Secondary | ICD-10-CM | POA: Diagnosis not present

## 2023-08-17 LAB — BASIC METABOLIC PANEL
BUN: 15 mg/dL (ref 6–23)
CO2: 30 meq/L (ref 19–32)
Calcium: 8.8 mg/dL (ref 8.4–10.5)
Chloride: 105 meq/L (ref 96–112)
Creatinine, Ser: 0.58 mg/dL (ref 0.40–1.20)
GFR: 87.57 mL/min (ref >60.00)
Glucose, Bld: 90 mg/dL (ref 70–99)
Potassium: 4.2 meq/L (ref 3.5–5.1)
Sodium: 142 meq/L (ref 135–145)

## 2023-08-17 LAB — HEMOGLOBIN A1C: Hgb A1c MFr Bld: 5.9 % (ref 4.6–6.5)

## 2023-08-17 MED ORDER — METFORMIN HCL ER 500 MG PO TB24
500.0000 mg | ORAL_TABLET | Freq: Every evening | ORAL | 1 refills | Status: DC
Start: 2023-08-17 — End: 2023-08-17

## 2023-08-17 MED ORDER — METFORMIN HCL ER 500 MG PO TB24
500.0000 mg | ORAL_TABLET | Freq: Every evening | ORAL | 1 refills | Status: DC
Start: 2023-08-17 — End: 2024-02-19

## 2023-08-17 NOTE — Patient Instructions (Signed)
 Visit Information  Thank you for taking time to visit with me today. Please don't hesitate to contact me if I can be of assistance to you.   Following are the goals we discussed today:   Goals Addressed             This Visit's Progress    COMPLETED: Care Coordination Activities-No follow up required       Care Coordination Interventions: Advised patient to Annual Wellness exam. Discussed services and support. Assessed SDOH. Advised to discuss with primary care physician if services needed in the future.            If you are experiencing a Mental Health or Behavioral Health Crisis or need someone to talk to, please call the Suicide and Crisis Lifeline: 988   Patient verbalizes understanding of instructions and care plan provided today and agrees to view in MyChart. Active MyChart status and patient understanding of how to access instructions and care plan via MyChart confirmed with patient.     The patient has been provided with contact information for the care management team and has been advised to call with any health related questions or concerns.   Sanchez Hemmer J Delores Edelstein, RN, MSN RN Care Manager [facility], Population Health Direct Dial: [phone]  Fax: [phone] Website: [email or web address]

## 2023-08-17 NOTE — Patient Outreach (Signed)
  Care Coordination   In Person Provider Office Visit Note   08/17/2023 Name: CAMRIN LAPRE MRN: [id number] DOB: [birth year]  KATLYNNE MCKERCHER is a 78 y.o. year old female who sees Berneta Elsie Sayre, MD for primary care. I engaged with Erminio FORBES Moats in the providers office today.  What matters to the patients health and wellness today?  Maintain health    Goals Addressed             This Visit's Progress    Care Coordination Activities-No follow up required       Care Coordination Interventions: Advised patient to Annual Wellness exam. Discussed services and support. Assessed SDOH. Advised to discuss with primary care physician if services needed in the future.          SDOH assessments and interventions completed:  Yes  SDOH Interventions Today    Flowsheet Row Most Recent Value  SDOH Interventions   Food Insecurity Interventions Intervention Not Indicated  Housing Interventions Intervention Not Indicated  Transportation Interventions Intervention Not Indicated        Care Coordination Interventions:  Yes, provided   Follow up plan: No further intervention required.   Encounter Outcome:  Patient Visit Completed   Anahis Furgeson J Kaeo Jacome, RN, MSN RN Care Manager [facility], Population Health Direct Dial: [phone]  Fax: [phone] Website: [email or web address]

## 2023-08-17 NOTE — Progress Notes (Signed)
 Established Patient Office Visit   Subjective:  Patient ID: Brenda Delgado, female    DOB: [birth year]  Age: 78 y.o. MRN: [id number]  Chief Complaint  Patient presents with   Medical Management of Chronic Issues    3 month follow up.     HPI Encounter Diagnoses  Name Primary?   Prediabetes Yes   For follow-up of above.  Accompanied by her husband today.  While the abdominal cramps associated with metformin  have long since past she still has some issue with increased flatulence that may involve small amounts of stool.  She is wearing disposable underwear.  This may have been twice weekly.   Review of Systems  Constitutional: Negative.   HENT: Negative.    Eyes:  Negative for blurred vision, discharge and redness.  Respiratory: Negative.    Cardiovascular: Negative.   Gastrointestinal:  Negative for abdominal pain.  Genitourinary: Negative.   Musculoskeletal: Negative.  Negative for myalgias.  Skin:  Negative for rash.  Neurological:  Negative for tingling, loss of consciousness and weakness.  Endo/Heme/Allergies:  Negative for polydipsia.     Current Outpatient Medications:    ALBUTEROL IN, Inhale into the lungs as needed., Disp: , Rfl:    Apoaequorin (PREVAGEN PO), Take by mouth., Disp: , Rfl:    ARNUITY ELLIPTA  100 MCG/ACT AEPB, Take 1 puff by mouth daily., Disp: , Rfl:    Ascorbic Acid (VITAMIN C) 1000 MG tablet, Take 1,000 mg by mouth daily., Disp: , Rfl:    aspirin EC 81 MG tablet, Take 81 mg by mouth daily., Disp: , Rfl:    Calcium-Vitamin D-Vitamin K 500-500-40 MG-UNT-MCG CHEW, Chew 2 tablets by mouth daily., Disp: , Rfl:    Cholecalciferol (VITAMIN D3) 50 MCG (2000 UT) TABS, Take 50 mcg by mouth 2 (two) times daily., Disp: , Rfl:    Coenzyme Q10 (COQ10 PO), Take 1 capsule by mouth daily., Disp: , Rfl:    EPINEPHrine  0.3 mg/0.3 mL IJ SOAJ injection, SMARTSIG:1 Pre-Filled Pen Syringe IM As Needed, Disp: , Rfl:    FLOVENT HFA 110 MCG/ACT inhaler, SMARTSIG:2 Puff(s) By  Mouth Twice Daily, Disp: , Rfl:    meloxicam (MOBIC) 15 MG tablet, Take 15 mg by mouth daily as needed for pain., Disp: , Rfl:    mometasone  (NASONEX ) 50 MCG/ACT nasal spray, Place 2 sprays into the nose daily., Disp: 1 each, Rfl: 12   Multiple Vitamins-Minerals (PRESERVISION AREDS 2) CAPS, Take 2 capsules by mouth daily., Disp: , Rfl:    omega-3 acid ethyl esters (LOVAZA) 1 g capsule, Take by mouth 2 (two) times daily., Disp: , Rfl:    pantoprazole  (PROTONIX ) 40 MG tablet, Take 1 tablet (40 mg total) by mouth at bedtime., Disp: 90 tablet, Rfl: 3   PARoxetine  (PAXIL ) 20 MG tablet, Take 1 tablet (20 mg total) by mouth daily., Disp: 90 tablet, Rfl: 3   tamoxifen  (NOLVADEX ) 10 MG tablet, Take 1 tablet (10 mg total) by mouth daily., Disp: 90 tablet, Rfl: 4   zinc gluconate 50 MG tablet, Take 50 mg by mouth daily., Disp: , Rfl:    metFORMIN  (GLUCOPHAGE -XR) 500 MG 24 hr tablet, Take 1 tablet (500 mg total) by mouth at bedtime., Disp: 90 tablet, Rfl: 1   Objective:     BP 118/66   Pulse 85   Temp 98 F (36.7 C)   Ht 4' 10 (1.473 m)   Wt 119 lb 6.4 oz (54.2 kg)   SpO2 96%   BMI 24.95 kg/m  Physical Exam Constitutional:      General: She is not in acute distress.    Appearance: Normal appearance. She is not ill-appearing, toxic-appearing or diaphoretic.  HENT:     Head: Normocephalic and atraumatic.     Right Ear: External ear normal.     Left Ear: External ear normal.  Eyes:     General: No scleral icterus.       Right eye: No discharge.        Left eye: No discharge.     Extraocular Movements: Extraocular movements intact.     Conjunctiva/sclera: Conjunctivae normal.  Pulmonary:     Effort: Pulmonary effort is normal. No respiratory distress.  Skin:    General: Skin is warm and dry.  Neurological:     Mental Status: She is alert and oriented to person, place, and time.  Psychiatric:        Mood and Affect: Mood normal.        Behavior: Behavior normal.      No results  found for any visits on 08/17/23.    The 10-year ASCVD risk score (Arnett DK, et al., 2019) is: 15.3%    Assessment & Plan:   Prediabetes -     Basic metabolic panel -     Hemoglobin A1c -     metFORMIN  HCl ER; Take 1 tablet (500 mg total) by mouth at bedtime.  Dispense: 90 tablet; Refill: 1    Return in about 3 months (around 11/15/2023).  Patient is having some minor issues taking the metformin .  We discussed alternatives.  For now she would like to stick with it.  Elsie Sim Lent, MD

## 2023-08-23 DIAGNOSIS — M5415 Radiculopathy, thoracolumbar region: Secondary | ICD-10-CM | POA: Diagnosis not present

## 2023-08-23 DIAGNOSIS — M9903 Segmental and somatic dysfunction of lumbar region: Secondary | ICD-10-CM | POA: Diagnosis not present

## 2023-08-23 DIAGNOSIS — M9901 Segmental and somatic dysfunction of cervical region: Secondary | ICD-10-CM | POA: Diagnosis not present

## 2023-08-23 DIAGNOSIS — M5451 Vertebrogenic low back pain: Secondary | ICD-10-CM | POA: Diagnosis not present

## 2023-09-07 DIAGNOSIS — M5451 Vertebrogenic low back pain: Secondary | ICD-10-CM | POA: Diagnosis not present

## 2023-09-07 DIAGNOSIS — M9901 Segmental and somatic dysfunction of cervical region: Secondary | ICD-10-CM | POA: Diagnosis not present

## 2023-09-07 DIAGNOSIS — M9903 Segmental and somatic dysfunction of lumbar region: Secondary | ICD-10-CM | POA: Diagnosis not present

## 2023-09-07 DIAGNOSIS — M5415 Radiculopathy, thoracolumbar region: Secondary | ICD-10-CM | POA: Diagnosis not present

## 2023-09-20 DIAGNOSIS — M5451 Vertebrogenic low back pain: Secondary | ICD-10-CM | POA: Diagnosis not present

## 2023-09-20 DIAGNOSIS — H353112 Nonexudative age-related macular degeneration, right eye, intermediate dry stage: Secondary | ICD-10-CM | POA: Diagnosis not present

## 2023-09-20 DIAGNOSIS — M9903 Segmental and somatic dysfunction of lumbar region: Secondary | ICD-10-CM | POA: Diagnosis not present

## 2023-09-20 DIAGNOSIS — H353123 Nonexudative age-related macular degeneration, left eye, advanced atrophic without subfoveal involvement: Secondary | ICD-10-CM | POA: Diagnosis not present

## 2023-09-20 DIAGNOSIS — M5415 Radiculopathy, thoracolumbar region: Secondary | ICD-10-CM | POA: Diagnosis not present

## 2023-09-20 DIAGNOSIS — M9901 Segmental and somatic dysfunction of cervical region: Secondary | ICD-10-CM | POA: Diagnosis not present

## 2023-10-01 DIAGNOSIS — M5451 Vertebrogenic low back pain: Secondary | ICD-10-CM | POA: Diagnosis not present

## 2023-10-01 DIAGNOSIS — M5415 Radiculopathy, thoracolumbar region: Secondary | ICD-10-CM | POA: Diagnosis not present

## 2023-10-01 DIAGNOSIS — M9903 Segmental and somatic dysfunction of lumbar region: Secondary | ICD-10-CM | POA: Diagnosis not present

## 2023-10-01 DIAGNOSIS — M9901 Segmental and somatic dysfunction of cervical region: Secondary | ICD-10-CM | POA: Diagnosis not present

## 2023-10-08 ENCOUNTER — Ambulatory Visit: Payer: Self-pay | Admitting: Family Medicine

## 2023-10-08 NOTE — Telephone Encounter (Signed)
  Chief Complaint: urinary frequency and retention x 3 weeks. Hx DM Symptoms: urinary frequency, burning sensation every urination. Looks cloudy to orange at times. Reports only drops at times but then can urinate.  Frequency: 3 weeks  Pertinent Negatives: Patient denies fever, no back no flank pain. No odor to urine. No blood in urine.  Disposition: [] ED /[] Urgent Care (no appt availability in office) / [x] Appointment(In office/virtual)/ []  [place] [facility]/ [] Home Care/ [] Refused Recommended Disposition /[] [place] [facility]/ []  Follow-up with PCP Additional Notes:   Offered appt today at different location. Patient declined and wants to see PCP. Appt scheduled tomorrow. Recommended if unable to urinate >4 hours and only drops go to ED. If fever noted or sx worsen go to ED. Please advise if any available appt today .       Copied from CRM [id number]. Topic: Clinical - Red Word Triage >> Oct 08, 2023  1:39 PM Alvino Blood C wrote: Red Word that prompted transfer to Nurse Triage: Patient believes she may have a bladder/UTI she is having a burning sensation and the urge to go but nothing comes out. She has been having the symptoms Reason for Disposition  Diabetes mellitus or weak immune system (e.g., HIV positive, cancer chemo, splenectomy, organ transplant, chronic steroids)  Answer Assessment - Initial Assessment Questions 1. SEVERITY: "How bad is the pain?"  (e.g., Scale 1-10; mild, moderate, or severe)   - MILD (1-3): complains slightly about urination hurting   - MODERATE (4-7): interferes with normal activities     - SEVERE (8-10): excruciating, unwilling or unable to urinate because of the pain      Burning sensation with every urination  2. FREQUENCY: "How many times have you had painful urination today?"      na 3. PATTERN: "Is pain present every time you urinate or just sometimes?"      Almost every natime to urinate 4. ONSET: "When did the painful urination start?"      3  weeks ago  5. FEVER: "Do you have a fever?" If Yes, ask: "What is your temperature, how was it measured, and when did it start?"     denies 6. PAST UTI: "Have you had a urine infection before?" If Yes, ask: "When was the last time?" and "What happened that time?"      Yes  7. CAUSE: "What do you think is causing the painful urination?"  (e.g., UTI, scratch, Herpes sore)     UTI  8. OTHER SYMPTOMS: "Do you have any other symptoms?" (e.g., blood in urine, flank pain, genital sores, urgency, vaginal discharge)     Frequency, urgency , drops of urine at times then can urinate , urine cloudy  at times, looks orange at times.  9. PREGNANCY: "Is there any chance you are pregnant?" "When was your last menstrual period?"     na  Protocols used: Urination Pain - Female-A-AH

## 2023-10-09 ENCOUNTER — Encounter: Payer: Self-pay | Admitting: Family Medicine

## 2023-10-09 ENCOUNTER — Ambulatory Visit (INDEPENDENT_AMBULATORY_CARE_PROVIDER_SITE_OTHER): Admitting: Family Medicine

## 2023-10-09 VITALS — BP 124/60 | HR 70 | Temp 97.0°F | Ht <= 58 in | Wt 120.4 lb

## 2023-10-09 DIAGNOSIS — J45909 Unspecified asthma, uncomplicated: Secondary | ICD-10-CM | POA: Insufficient documentation

## 2023-10-09 DIAGNOSIS — N3000 Acute cystitis without hematuria: Secondary | ICD-10-CM | POA: Diagnosis not present

## 2023-10-09 DIAGNOSIS — J4520 Mild intermittent asthma, uncomplicated: Secondary | ICD-10-CM

## 2023-10-09 DIAGNOSIS — R300 Dysuria: Secondary | ICD-10-CM

## 2023-10-09 LAB — POCT URINALYSIS DIPSTICK
Bilirubin, UA: NEGATIVE
Blood, UA: 10
Glucose, UA: NEGATIVE
Ketones, UA: NEGATIVE
Nitrite, UA: POSITIVE
Protein, UA: POSITIVE — AB
Spec Grav, UA: 1.02 (ref 1.010–1.025)
Urobilinogen, UA: 0.2 U/dL
pH, UA: 6 (ref 5.0–8.0)

## 2023-10-09 MED ORDER — ARNUITY ELLIPTA 100 MCG/ACT IN AEPB: 1.0000 | INHALATION_SPRAY | Freq: Every day | RESPIRATORY_TRACT | 1 refills | Status: DC

## 2023-10-09 MED ORDER — NITROFURANTOIN MONOHYD MACRO 100 MG PO CAPS: 100.0000 mg | ORAL_CAPSULE | Freq: Two times a day (BID) | ORAL | 0 refills | Status: AC

## 2023-10-09 NOTE — Progress Notes (Signed)
 Established Patient Office Visit   Subjective:  Patient ID: Brenda Delgado, female    DOB: May 11, 1946  Age: 78 y.o. MRN: [id number]  Chief Complaint  Patient presents with   Urinary Frequency    Urinary Frequency  Associated symptoms include frequency and urgency. Pertinent negatives include no chills, nausea or vomiting.   Encounter Diagnoses  Name Primary?   Dysuria Yes   Acute cystitis without hematuria    Mild intermittent reactive airway disease without complication    2 to 3-week history of intermittent dysuria frequency and urgency.  She denies fevers chills nausea or vomiting.  Sensitivities to sulfur and Augmentin.  She has seen pulmonology for reactive airway disease.  Given prescription for Arnuity Ellipta.   Review of Systems  Constitutional: Negative.  Negative for chills and fever.  HENT: Negative.    Eyes:  Negative for blurred vision, discharge and redness.  Respiratory: Negative.    Cardiovascular: Negative.   Gastrointestinal:  Negative for abdominal pain, nausea and vomiting.  Genitourinary:  Positive for dysuria, frequency and urgency.  Musculoskeletal: Negative.  Negative for myalgias.  Skin:  Negative for rash.  Neurological:  Negative for tingling, loss of consciousness and weakness.  Endo/Heme/Allergies:  Negative for polydipsia.     Current Outpatient Medications:    ALBUTEROL IN, Inhale into the lungs as needed., Disp: , Rfl:    Apoaequorin (PREVAGEN PO), Take by mouth., Disp: , Rfl:    Ascorbic Acid (VITAMIN C) 1000 MG tablet, Take 1,000 mg by mouth daily., Disp: , Rfl:    aspirin EC 81 MG tablet, Take 81 mg by mouth daily., Disp: , Rfl:    Calcium-Vitamin D-Vitamin K 500-500-40 MG-UNT-MCG CHEW, Chew 2 tablets by mouth daily., Disp: , Rfl:    Cholecalciferol (VITAMIN D3) 50 MCG (2000 UT) TABS, Take 50 mcg by mouth 2 (two) times daily., Disp: , Rfl:    Coenzyme Q10 (COQ10 PO), Take 1 capsule by mouth daily., Disp: , Rfl:    EPINEPHrine 0.3  mg/0.3 mL IJ SOAJ injection, SMARTSIG:1 Pre-Filled Pen Syringe IM As Needed, Disp: , Rfl:    FLOVENT HFA 110 MCG/ACT inhaler, SMARTSIG:2 Puff(s) By Mouth Twice Daily, Disp: , Rfl:    meloxicam (MOBIC) 15 MG tablet, Take 15 mg by mouth daily as needed for pain., Disp: , Rfl:    metFORMIN (GLUCOPHAGE-XR) 500 MG 24 hr tablet, Take 1 tablet (500 mg total) by mouth at bedtime., Disp: 90 tablet, Rfl: 1   mometasone (NASONEX) 50 MCG/ACT nasal spray, Place 2 sprays into the nose daily., Disp: 1 each, Rfl: 12   Multiple Vitamins-Minerals (PRESERVISION AREDS 2) CAPS, Take 2 capsules by mouth daily., Disp: , Rfl:    nitrofurantoin, macrocrystal-monohydrate, (MACROBID) 100 MG capsule, Take 1 capsule (100 mg total) by mouth 2 (two) times daily for 7 days., Disp: 14 capsule, Rfl: 0   omega-3 acid ethyl esters (LOVAZA) 1 g capsule, Take by mouth 2 (two) times daily., Disp: , Rfl:    pantoprazole (PROTONIX) 40 MG tablet, Take 1 tablet (40 mg total) by mouth at bedtime., Disp: 90 tablet, Rfl: 3   PARoxetine (PAXIL) 20 MG tablet, Take 1 tablet (20 mg total) by mouth daily., Disp: 90 tablet, Rfl: 3   tamoxifen (NOLVADEX) 10 MG tablet, Take 1 tablet (10 mg total) by mouth daily., Disp: 90 tablet, Rfl: 4   Turmeric 400 MG CAPS, Take by mouth., Disp: , Rfl:    zinc gluconate 50 MG tablet, Take 50 mg by mouth daily.,  Disp: , Rfl:    ARNUITY ELLIPTA 100 MCG/ACT AEPB, Take 1 puff by mouth daily., Disp: 30 each, Rfl: 1   Objective:     BP 124/60   Pulse 70   Temp (!) 97 F (36.1 C)   Ht 4\' 10"  (1.473 m)   Wt 120 lb 6.4 oz (54.6 kg)   SpO2 97%   BMI 25.16 kg/m    Physical Exam Constitutional:      General: She is not in acute distress.    Appearance: Normal appearance. She is not ill-appearing, toxic-appearing or diaphoretic.  HENT:     Head: Normocephalic and atraumatic.     Right Ear: External ear normal.     Left Ear: External ear normal.  Eyes:     General: No scleral icterus.       Right eye: No  discharge.        Left eye: No discharge.     Extraocular Movements: Extraocular movements intact.     Conjunctiva/sclera: Conjunctivae normal.  Cardiovascular:     Rate and Rhythm: Normal rate and regular rhythm.  Pulmonary:     Effort: Pulmonary effort is normal. No respiratory distress.     Breath sounds: No wheezing or rales.  Abdominal:     Tenderness: There is no right CVA tenderness or left CVA tenderness.  Skin:    General: Skin is warm and dry.  Neurological:     Mental Status: She is alert and oriented to person, place, and time.  Psychiatric:        Mood and Affect: Mood normal.        Behavior: Behavior normal.      Results for orders placed or performed in visit on 10/09/23  POCT Urinalysis Dipstick  Result Value Ref Range   Color, UA pale    Clarity, UA cloudy    Glucose, UA Negative Negative   Bilirubin, UA neg    Ketones, UA neg    Spec Grav, UA 1.020 1.010 - 1.025   Blood, UA 10    pH, UA 6.0 5.0 - 8.0   Protein, UA Positive (A) Negative   Urobilinogen, UA 0.2 0.2 or 1.0 E.U./dL   Nitrite, UA positive    Leukocytes, UA Large (3+) (A) Negative   Appearance     Odor        The 10-year ASCVD risk score (Arnett DK, et al., 2019) is: 18.8%    Assessment & Plan:   Dysuria -     POCT urinalysis dipstick  Acute cystitis without hematuria -     Urine Culture -     Nitrofurantoin Monohyd Macro; Take 1 capsule (100 mg total) by mouth 2 (two) times daily for 7 days.  Dispense: 14 capsule; Refill: 0  Mild intermittent reactive airway disease without complication -     Arnuity Ellipta; Take 1 puff by mouth daily.  Dispense: 30 each; Refill: 1    Return if symptoms worsen or fail to improve, for Schedule follow-up with pulmonology.Mliss Sax, MD

## 2023-10-10 ENCOUNTER — Other Ambulatory Visit: Payer: Self-pay | Admitting: Family Medicine

## 2023-10-10 DIAGNOSIS — J4520 Mild intermittent asthma, uncomplicated: Secondary | ICD-10-CM

## 2023-10-11 ENCOUNTER — Encounter: Payer: Self-pay | Admitting: Family Medicine

## 2023-10-11 ENCOUNTER — Telehealth: Payer: Self-pay

## 2023-10-11 DIAGNOSIS — J4520 Mild intermittent asthma, uncomplicated: Secondary | ICD-10-CM

## 2023-10-11 LAB — URINE CULTURE
MICRO NUMBER:: 16156630
SPECIMEN QUALITY:: ADEQUATE

## 2023-10-11 MED ORDER — MOMETASONE FUROATE 220 MCG/ACT IN AEPB: 2.0000 | INHALATION_SPRAY | Freq: Every day | RESPIRATORY_TRACT | 5 refills | Status: AC

## 2023-10-11 NOTE — Telephone Encounter (Signed)
 Left detailed VM regarding medication change. Dm/cma

## 2023-10-11 NOTE — Telephone Encounter (Signed)
 Copied from CRM [id number]. Topic: Clinical - Prescription Issue >> Oct 11, 2023  2:53 PM Sonny Dandy B wrote: Reason for CRM: [facility] called regarding ARNUITY ELLIPTA 100 MCG/ACT AEPB states pt has a [employer], they will not pay for the medication. Insurance is requesting a different medication. Pulmicort  or asthmanex Please call [facility] back at . [phone]   Please review and advise on medication change.  Dm/cma

## 2023-10-12 DIAGNOSIS — H43813 Vitreous degeneration, bilateral: Secondary | ICD-10-CM | POA: Diagnosis not present

## 2023-10-12 DIAGNOSIS — H353132 Nonexudative age-related macular degeneration, bilateral, intermediate dry stage: Secondary | ICD-10-CM | POA: Diagnosis not present

## 2023-10-16 ENCOUNTER — Ambulatory Visit: Payer: BC Managed Care – PPO | Admitting: Family Medicine

## 2023-10-19 ENCOUNTER — Ambulatory Visit: Payer: Self-pay | Admitting: Family Medicine

## 2023-10-19 ENCOUNTER — Ambulatory Visit: Admitting: Family

## 2023-10-19 ENCOUNTER — Encounter: Payer: Self-pay | Admitting: Family

## 2023-10-19 VITALS — BP 120/76 | HR 82 | Temp 98.2°F | Ht <= 58 in | Wt 120.6 lb

## 2023-10-19 DIAGNOSIS — N390 Urinary tract infection, site not specified: Secondary | ICD-10-CM | POA: Diagnosis not present

## 2023-10-19 DIAGNOSIS — R300 Dysuria: Secondary | ICD-10-CM

## 2023-10-19 DIAGNOSIS — A499 Bacterial infection, unspecified: Secondary | ICD-10-CM | POA: Diagnosis not present

## 2023-10-19 LAB — POCT URINALYSIS DIPSTICK
Bilirubin, UA: NEGATIVE
Glucose, UA: NEGATIVE
Ketones, UA: NEGATIVE
Nitrite, UA: NEGATIVE
Protein, UA: POSITIVE — AB
Spec Grav, UA: 1.02 (ref 1.010–1.025)
Urobilinogen, UA: 0.2 U/dL
pH, UA: 6 (ref 5.0–8.0)

## 2023-10-19 MED ORDER — CLINDAMYCIN HCL 150 MG PO CAPS: 150.0000 mg | ORAL_CAPSULE | Freq: Three times a day (TID) | ORAL | 0 refills | Status: DC

## 2023-10-19 NOTE — Telephone Encounter (Signed)
  Chief Complaint: urinary frequency Symptoms: urinary frequency and burning Frequency: last week Pertinent Negatives: Patient denies fever, flank pain Disposition: [] ED /[] Urgent Care (no appt availability in office) / [x] Appointment(In office/virtual)/ []  [place] [facility]/ [] Home Care/ [] Refused Recommended Disposition /[] [place] [facility]/ []  Follow-up with PCP Additional Notes: Patient reports she was seen last week and diagnosed with a UTI. Patient was prescribed Nitrofurantoin and states her symptoms have not improved even 3 days after completion of medication. Patient states she is still experiencing urinary frequency and burning with urination. Per protocol, appt scheduled today at [facility], due to no availability today at [facility]. Patient advised to call back with worsening symptoms. Patient verbalized understanding.     Copied from CRM [id number]. Topic: Clinical - Red Word Triage >> Oct 19, 2023 11:33 AM Almira Coaster wrote: Red Word that prompted transfer to Nurse Triage: Patient was seen on 10/09/2023 for a UTI, patient was given antibiotics and completed the cycle; however, patient is still experiencing symptoms. Reason for Disposition  [1] Taking antibiotic > 72 hours (3 days) for UTI AND [2] painful urination or frequency is SAME (unchanged, not better)  Answer Assessment - Initial Assessment Questions 1. MAIN SYMPTOM: "What is the main symptom you are concerned about?" (e.g., painful urination, urine frequency)     Frequency, burning 2. BETTER-SAME-WORSE: "Are you getting better, staying the same, or getting worse compared to how you felt at your last visit to the doctor (most recent medical visit)?"     same 3. PAIN: "How bad is the pain?"  (e.g., Scale 1-10; mild, moderate, or severe)   - MILD (1-3): complains slightly about urination hurting   - MODERATE (4-7): interferes with normal activities     - SEVERE (8-10): excruciating, unwilling or unable to urinate  because of the pain      mild 4. FEVER: "Do you have a fever?" If Yes, ask: "What is it, how was it measured, and when did it start?"     none 5. OTHER SYMPTOMS: "Do you have any other symptoms?" (e.g., blood in the urine, flank pain, vaginal discharge)     Burning, frequency  6. DIAGNOSIS: "When was the UTI diagnosed?" "By whom?" "Was it a kidney infection, bladder infection or both?"     UTI 7. ANTIBIOTIC: "What antibiotic(s) are you taking?" "How many times per day?"     Nitrofurantoin 8. ANTIBIOTIC - START DATE: "When did you start taking the antibiotic?"     3/5  Protocols used: Urinary Tract Infection on Antibiotic Follow-up Call - [facility]

## 2023-10-19 NOTE — Progress Notes (Signed)
   Acute Office Visit  Subjective:     Patient ID: Brenda Delgado, female    DOB: 1945/10/11, 78 y.o.   MRN: [id number]  Chief Complaint  Patient presents with  . Burning with urination    Hard time urinating.     HPI Patient is in today with complaints of persistent burning with urination and have a difficult time urinating.  She was treated for urinary tract infection earlier this month by her PCP with Macrobid.  She has since completed the antibiotic but her symptoms never really completely cleared.  Upon review of the culture, positive for Klebsiella.   Review of Systems  Respiratory: Negative.    Cardiovascular: Negative.   Genitourinary:  Positive for dysuria, frequency and urgency. Negative for flank pain and hematuria.  Musculoskeletal: Negative.   Neurological: Negative.   Psychiatric/Behavioral: Negative.    All other systems reviewed and are negative.      Objective:    BP 120/76 (BP Location: Left Arm, Patient Position: Sitting, Cuff Size: Small)   Pulse 82   Temp 98.2 F (36.8 C) (Oral)   Ht 4\' 10"  (1.473 m)   Wt 120 lb 9.6 oz (54.7 kg)   SpO2 95%   BMI 25.21 kg/m    Physical Exam Vitals and nursing note reviewed.  Constitutional:      Appearance: Normal appearance.  HENT:     Right Ear: Tympanic membrane and ear canal normal.     Left Ear: Tympanic membrane and ear canal normal.  Cardiovascular:     Rate and Rhythm: Normal rate and regular rhythm.     Pulses: Normal pulses.     Heart sounds: Normal heart sounds.  Pulmonary:     Effort: Pulmonary effort is normal.     Breath sounds: Normal breath sounds.  Abdominal:     General: Abdomen is flat. Bowel sounds are normal.     Palpations: Abdomen is soft.  Musculoskeletal:        General: Normal range of motion.     Cervical back: Normal range of motion and neck supple.  Skin:    General: Skin is warm and dry.  Neurological:     General: No focal deficit present.     Mental Status: She is alert  and oriented to person, place, and time.  Psychiatric:        Mood and Affect: Mood normal.        Behavior: Behavior normal.   No results found for any visits on 10/19/23.      Assessment & Plan:   Problem List Items Addressed This Visit     Dysuria - Primary   Relevant Orders   POC Urinalysis Dipstick   Urine Culture   Other Visit Diagnoses       Bacterial urinary tract infection       Relevant Medications   clindamycin (CLEOCIN) 150 MG capsule       Meds ordered this encounter  Medications  . clindamycin (CLEOCIN) 150 MG capsule    Sig: Take 1 capsule (150 mg total) by mouth 3 (three) times daily.    Dispense:  21 capsule    Refill:  0   Due to patient's allergies, clindamycin was selected to treat this bacterial infection.  Drink plenty of water.  Call the office if symptoms worsen or persist. No follow-ups on file.  Eulis Foster, FNP

## 2023-10-21 LAB — URINE CULTURE

## 2023-10-22 ENCOUNTER — Encounter: Payer: Self-pay | Admitting: Family

## 2023-10-22 DIAGNOSIS — J3089 Other allergic rhinitis: Secondary | ICD-10-CM | POA: Diagnosis not present

## 2023-10-22 DIAGNOSIS — J3081 Allergic rhinitis due to animal (cat) (dog) hair and dander: Secondary | ICD-10-CM | POA: Diagnosis not present

## 2023-10-22 DIAGNOSIS — J4520 Mild intermittent asthma, uncomplicated: Secondary | ICD-10-CM | POA: Diagnosis not present

## 2023-10-22 DIAGNOSIS — J301 Allergic rhinitis due to pollen: Secondary | ICD-10-CM | POA: Diagnosis not present

## 2023-11-01 DIAGNOSIS — M5451 Vertebrogenic low back pain: Secondary | ICD-10-CM | POA: Diagnosis not present

## 2023-11-01 DIAGNOSIS — M5415 Radiculopathy, thoracolumbar region: Secondary | ICD-10-CM | POA: Diagnosis not present

## 2023-11-01 DIAGNOSIS — M9901 Segmental and somatic dysfunction of cervical region: Secondary | ICD-10-CM | POA: Diagnosis not present

## 2023-11-01 DIAGNOSIS — M9903 Segmental and somatic dysfunction of lumbar region: Secondary | ICD-10-CM | POA: Diagnosis not present

## 2023-11-05 ENCOUNTER — Ambulatory Visit (INDEPENDENT_AMBULATORY_CARE_PROVIDER_SITE_OTHER): Admitting: Internal Medicine

## 2023-11-05 ENCOUNTER — Encounter: Payer: Self-pay | Admitting: Internal Medicine

## 2023-11-05 VITALS — BP 110/60 | HR 81 | Temp 98.3°F | Ht <= 58 in | Wt 119.4 lb

## 2023-11-05 DIAGNOSIS — N390 Urinary tract infection, site not specified: Secondary | ICD-10-CM | POA: Diagnosis not present

## 2023-11-05 LAB — POCT URINALYSIS DIPSTICK
Bilirubin, UA: NEGATIVE
Glucose, UA: NEGATIVE
Ketones, UA: NEGATIVE
Nitrite, UA: NEGATIVE
Protein, UA: POSITIVE — AB
Spec Grav, UA: 1.02 (ref 1.010–1.025)
Urobilinogen, UA: 0.2 U/dL
pH, UA: 6 (ref 5.0–8.0)

## 2023-11-05 MED ORDER — CEFTRIAXONE SODIUM 1 G IJ SOLR
1.0000 g | Freq: Once | INTRAMUSCULAR | Status: AC
Start: 2023-11-05 — End: 2023-11-05
  Administered 2023-11-05: 1 g via INTRAMUSCULAR

## 2023-11-05 MED ORDER — CEPHALEXIN 500 MG PO CAPS
500.0000 mg | ORAL_CAPSULE | Freq: Two times a day (BID) | ORAL | 0 refills | Status: AC
Start: 2023-11-05 — End: 2023-11-12

## 2023-11-05 NOTE — Progress Notes (Signed)
 [facility] [address] [place] [address] Dept: [phone] Dept Fax: [phone]  Acute Care Office Visit  Subjective:   Brenda Delgado 1945-12-30 11/05/2023  Chief Complaint  Patient presents with   Urinary Tract Infection    Got a little better but stopped feeling burning    HPI: Brenda Delgado is a 78 yo F who complaints of burning with urination ongoing for several weeks. Patient was initially seen on 10/09/23 by PCP Dr Doreene Burke, was given macrobid with little relief. Came back on 10/19/23, seen by Worthy Rancher FNP, was given Clindamycin which only provided some relief.  Each time urine culture detected Klebsiella pneumonia as cause of UTIs. URINARY SYMPTOMS Onset: several weeks ago.  Fever/chills: no Dysuria: yes Urinary frequency: yes Urgency: yes Foul odor: no Urinary incontinence: no Hematuria: no Abdominal pain: no Suprapubic pain/pressure: no Flank/low back pain: no Nausea/Vomiting: no  Previous urinary tract infection: yes     The following portions of the patient's history were reviewed and updated as appropriate: past medical history, past surgical history, family history, social history, allergies, medications, and problem list.   Patient Active Problem List   Diagnosis Date Noted   Dysuria 10/09/2023   Acute cystitis without hematuria 10/09/2023   Reactive airway disease 10/09/2023   Gastroesophageal reflux disease 12/12/2022   Irritable bladder 12/12/2022   Depression with anxiety 12/12/2022   Neck pain 11/10/2020   Urinary frequency 11/10/2020   Abrasion of nose 02/02/2020   Cognitive decline 02/02/2020   Healthcare maintenance 08/13/2019   Family history of ovarian cancer    Family history of colon cancer    Ductal carcinoma in situ (DCIS) of left breast 07/28/2019   History of elevated glucose 07/22/2019   Elevated LDL cholesterol level 07/22/2019   Tremor of face and hands  07/22/2019   Left ureteral stone 05/14/2019   METATARSALGIA 04/15/2008   FOOT PAIN, BILATERAL 04/15/2008   Past Medical History:  Diagnosis Date   Allergy    Anxiety    Arthritis    right knee   Cancer (HCC) 07/2019   left breast DCIS   Complication of anesthesia    per pt with surgery 05-14-2019 right facial swelling and lip/ mouth blister's   Constipation    Depression    Environmental and seasonal allergies    Family history of colon cancer    Family history of ovarian cancer    GERD (gastroesophageal reflux disease)    Hiatal hernia    History of kidney stones    Left ureteral stone    Mild asthma    Nephrolithiasis    Nocturia    PONV (postoperative nausea and vomiting)    Pre-diabetes    Renal calculus, left    Wears glasses    Wears hearing aid in both ears    Past Surgical History:  Procedure Laterality Date   ABDOMINAL HYSTERECTOMY     ANTERIOR CERVICAL DECOMP/DISCECTOMY FUSION  01-06-2010   @[facility]    C4--5   BREAST LUMPECTOMY WITH RADIOACTIVE SEED LOCALIZATION Left 08/12/2019   Procedure: LEFT BREAST LUMPECTOMY WITH RADIOACTIVE SEED LOCALIZATION;  Surgeon: Ovidio Kin, MD;  Location: [facility];  Service: General;  Laterality: Left;   CARPAL TUNNEL RELEASE Right 2000   cataracts  2018   bilateral   CYSTOCELE REPAIR  09/11/2011   Procedure: ANTERIOR REPAIR (CYSTOCELE);  Surgeon: Levi Aland, MD;  Location: [facility];  Service: Gynecology;  Laterality: N/A;  CYSTOSCOPY  09/11/2011   Procedure: CYSTOSCOPY;  Surgeon: Levi Aland, MD;  Location: [facility];  Service: Gynecology;  Laterality: N/A;   CYSTOSCOPY WITH RETROGRADE PYELOGRAM, URETEROSCOPY AND STENT PLACEMENT Left 05/28/2019   Procedure: CYSTOSCOPY WITH RETROGRADE PYELOGRAM, URETEROSCOPY AND STENT REPLACEMENT;  Surgeon: Sebastian Ache, MD;  Location: [facility];  Service: Urology;  Laterality: Left;   CYSTOSCOPY WITH STENT PLACEMENT Left 05/14/2019   Procedure: CYSTOSCOPY WITH  STENT PLACEMENT, RETROGRADE;  Surgeon: Alfredo Martinez, MD;  Location: [facility];  Service: Urology;  Laterality: Left;   ELBOW SURGERY Right    EXCISION VULVA CYST Bilateral 01-16-2005  @[facility]    HOLMIUM LASER APPLICATION Left 05/28/2019   Procedure: HOLMIUM LASER APPLICATION;  Surgeon: Sebastian Ache, MD;  Location: [facility];  Service: Urology;  Laterality: Left;   KNEE ARTHROSCOPY Right 2005;    02-11-2013   KNEE SURGERY Right 08-20-2012   @[facility]    peroneal nerve neurolysis proximal tibia/ fibular articular branch    LAPAROSCOPIC CHOLECYSTECTOMY  1990s   SACROSPINOUS LIGAMENT FIXATION  04-05-2017   @[facility]    UPHOLD vaginal mesh and  Anterior Repair   SALPINGOOPHORECTOMY  09/11/2011   Procedure: SALPINGO OOPHERECTOMY;  Surgeon: Levi Aland, MD;  Location: [facility];  Service: Gynecology;  Laterality: Bilateral;   SHOULDER SURGERY Right    TONSILLECTOMY     VAGINAL HYSTERECTOMY  09/11/2011   Procedure: HYSTERECTOMY VAGINAL;  Surgeon: Levi Aland, MD;  Location: [facility];  Service: Gynecology;  Laterality: N/A;   VULVA Ples Specter BIOPSY  09/11/2011   Procedure: VULVAR BIOPSY;  Surgeon: Levi Aland, MD;  Location: [facility];  Service: Gynecology;  Laterality: Left;  sebaceous cyst excision left vula   Family History  Problem Relation Age of Onset   Skin cancer Mother    Colon cancer Paternal Uncle        dx late 50s-60s   Ovarian cancer Paternal Aunt 53   Cancer Cousin        unk type    Current Outpatient Medications:    ALBUTEROL IN, Inhale into the lungs as needed., Disp: , Rfl:    Apoaequorin (PREVAGEN PO), Take by mouth., Disp: , Rfl:    Ascorbic Acid (VITAMIN C) 1000 MG tablet, Take 1,000 mg by mouth daily., Disp: , Rfl:    aspirin EC 81 MG tablet, Take 81 mg by mouth daily., Disp: , Rfl:    Calcium-Vitamin D-Vitamin K 500-500-40 MG-UNT-MCG CHEW, Chew 2 tablets by mouth daily., Disp: , Rfl:    cephALEXin (KEFLEX) 500 MG capsule, Take 1 capsule (500 mg total) by  mouth 2 (two) times daily for 7 days., Disp: 14 capsule, Rfl: 0   Cholecalciferol (VITAMIN D3) 50 MCG (2000 UT) TABS, Take 50 mcg by mouth 2 (two) times daily., Disp: , Rfl:    EPINEPHrine 0.3 mg/0.3 mL IJ SOAJ injection, SMARTSIG:1 Pre-Filled Pen Syringe IM As Needed, Disp: , Rfl:    meloxicam (MOBIC) 15 MG tablet, Take 15 mg by mouth daily as needed for pain., Disp: , Rfl:    metFORMIN (GLUCOPHAGE-XR) 500 MG 24 hr tablet, Take 1 tablet (500 mg total) by mouth at bedtime., Disp: 90 tablet, Rfl: 1   mometasone (ASMANEX) 220 MCG/ACT inhaler, Inhale 2 puffs into the lungs daily., Disp: 1 each, Rfl: 5   mometasone (NASONEX) 50 MCG/ACT nasal spray, Place 2 sprays into the nose daily., Disp: 1 each, Rfl: 12   Multiple Vitamins-Minerals (OCUVITE PO), Take by mouth., Disp: , Rfl:  Multiple Vitamins-Minerals (PRESERVISION AREDS 2) CAPS, Take 2 capsules by mouth daily., Disp: , Rfl:    omega-3 acid ethyl esters (LOVAZA) 1 g capsule, Take by mouth 2 (two) times daily., Disp: , Rfl:    pantoprazole (PROTONIX) 40 MG tablet, Take 1 tablet (40 mg total) by mouth at bedtime., Disp: 90 tablet, Rfl: 3   PARoxetine (PAXIL) 20 MG tablet, Take 1 tablet (20 mg total) by mouth daily., Disp: 90 tablet, Rfl: 3   tamoxifen (NOLVADEX) 10 MG tablet, Take 1 tablet (10 mg total) by mouth daily., Disp: 90 tablet, Rfl: 4   Turmeric 400 MG CAPS, Take by mouth., Disp: , Rfl:    zinc gluconate 50 MG tablet, Take 50 mg by mouth daily., Disp: , Rfl:    Coenzyme Q10 (COQ10 PO), Take 1 capsule by mouth daily., Disp: , Rfl:  Allergies  Allergen Reactions   Pravastatin     Other reaction(s): Myalgias (intolerance) 2016   Erythromycin Nausea And Vomiting    Cramps    Iohexol Hives     Code: HIVES, Desc: Pt's husband called when they got home from pt's CEPI #1.  Pt w/ hives/itching.  Instructed on taking Benadryl now, 1hr and q6hrs prn.  Instructed to add IV Contrast to allergy list, esp for CT scans, and to get pre-med info in  the future.  jkl, Onset Date: [id number]    Amoxicillin-Pot Clavulanate Other (See Comments)    GI Upset     Codeine Nausea And Vomiting   Moxifloxacin Itching   Sulfamethoxazole Itching and Rash     ROS: A complete ROS was performed with pertinent positives/negatives noted in the HPI. The remainder of the ROS are negative.    Objective:   Today's Vitals   11/05/23 1512  BP: 110/60  Pulse: 81  Temp: 98.3 F (36.8 C)  TempSrc: Temporal  SpO2: 95%  Weight: 119 lb 6.4 oz (54.2 kg)  Height: 4\' 10"  (1.473 m)    GENERAL: Well-appearing, in NAD. Well nourished.  SKIN: Pink, warm and dry. No rash, lesion, ulceration, or ecchymoses.  NECK: Trachea midline. Full ROM w/o pain or tenderness. No lymphadenopathy.  RESPIRATORY: Chest wall symmetrical. Respirations even and non-labored. Breath sounds clear to auscultation bilaterally.  CARDIAC: S1, S2 present, regular rate and rhythm. Peripheral pulses 2+ bilaterally.  GI: Abdomen soft, non-tender. Normoactive bowel sounds. No CVA tenderness.  GU: External genitalia without erythema, lesions, or masses. No lymphadenopathy. Vaginal mucosa pink and moist without exudate, lesions, or ulcerations. Chaperoned by Mary Sella CMA EXTREMITIES: Without clubbing, cyanosis, or edema.  NEUROLOGIC: No motor or sensory deficits. Steady, even gait.  PSYCH/MENTAL STATUS: Alert, oriented x 3. Cooperative, appropriate mood and affect.    Results for orders placed or performed in visit on 11/05/23  POC Urinalysis Dipstick  Result Value Ref Range   Color, UA yellow    Clarity, UA clear    Glucose, UA Negative Negative   Bilirubin, UA neg    Ketones, UA neg    Spec Grav, UA 1.020 1.010 - 1.025   Blood, UA Trace    pH, UA 6.0 5.0 - 8.0   Protein, UA Positive (A) Negative   Urobilinogen, UA 0.2 0.2 or 1.0 E.U./dL   Nitrite, UA neg    Leukocytes, UA Large (3+) (A) Negative   Appearance     Odor        Assessment & Plan:   1. Acute UTI  (Primary) - POC Urinalysis Dipstick - cefTRIAXone (ROCEPHIN) injection  1 g - cephALEXin (KEFLEX) 500 MG capsule; Take 1 capsule (500 mg total) by mouth 2 (two) times daily for 7 days.  Dispense: 14 capsule; Refill: 0 - Urine Culture   Meds ordered this encounter  Medications   cefTRIAXone (ROCEPHIN) injection 1 g   cephALEXin (KEFLEX) 500 MG capsule    Sig: Take 1 capsule (500 mg total) by mouth 2 (two) times daily for 7 days.    Dispense:  14 capsule    Refill:  0    Supervising Provider:   Garnette Gunner [[id number]]   Orders Placed This Encounter  Procedures   Urine Culture   POC Urinalysis Dipstick   Lab Orders         Urine Culture         POC Urinalysis Dipstick     No images are attached to the encounter or orders placed in the encounter.  Return if symptoms worsen or fail to improve.   Salvatore Decent, FNP

## 2023-11-12 ENCOUNTER — Telehealth: Payer: Self-pay

## 2023-11-12 NOTE — Addendum Note (Signed)
 Addended by: Mary Sella D on: 11/12/2023 02:48 PM   Modules accepted: Orders

## 2023-11-12 NOTE — Telephone Encounter (Signed)
 Patient did not leave enough urine for culture and was notified patient agrees to providing sample at upcoming appt 11/16/23

## 2023-11-13 IMAGING — CR DG CHEST 2V
2 series · 2 of 2 positions shown · non-contrast
Comparison: Chest radiograph dated 03/24/2021.

CLINICAL DATA: Cough.

EXAM:
CHEST - 2 VIEW

[w chest pa]
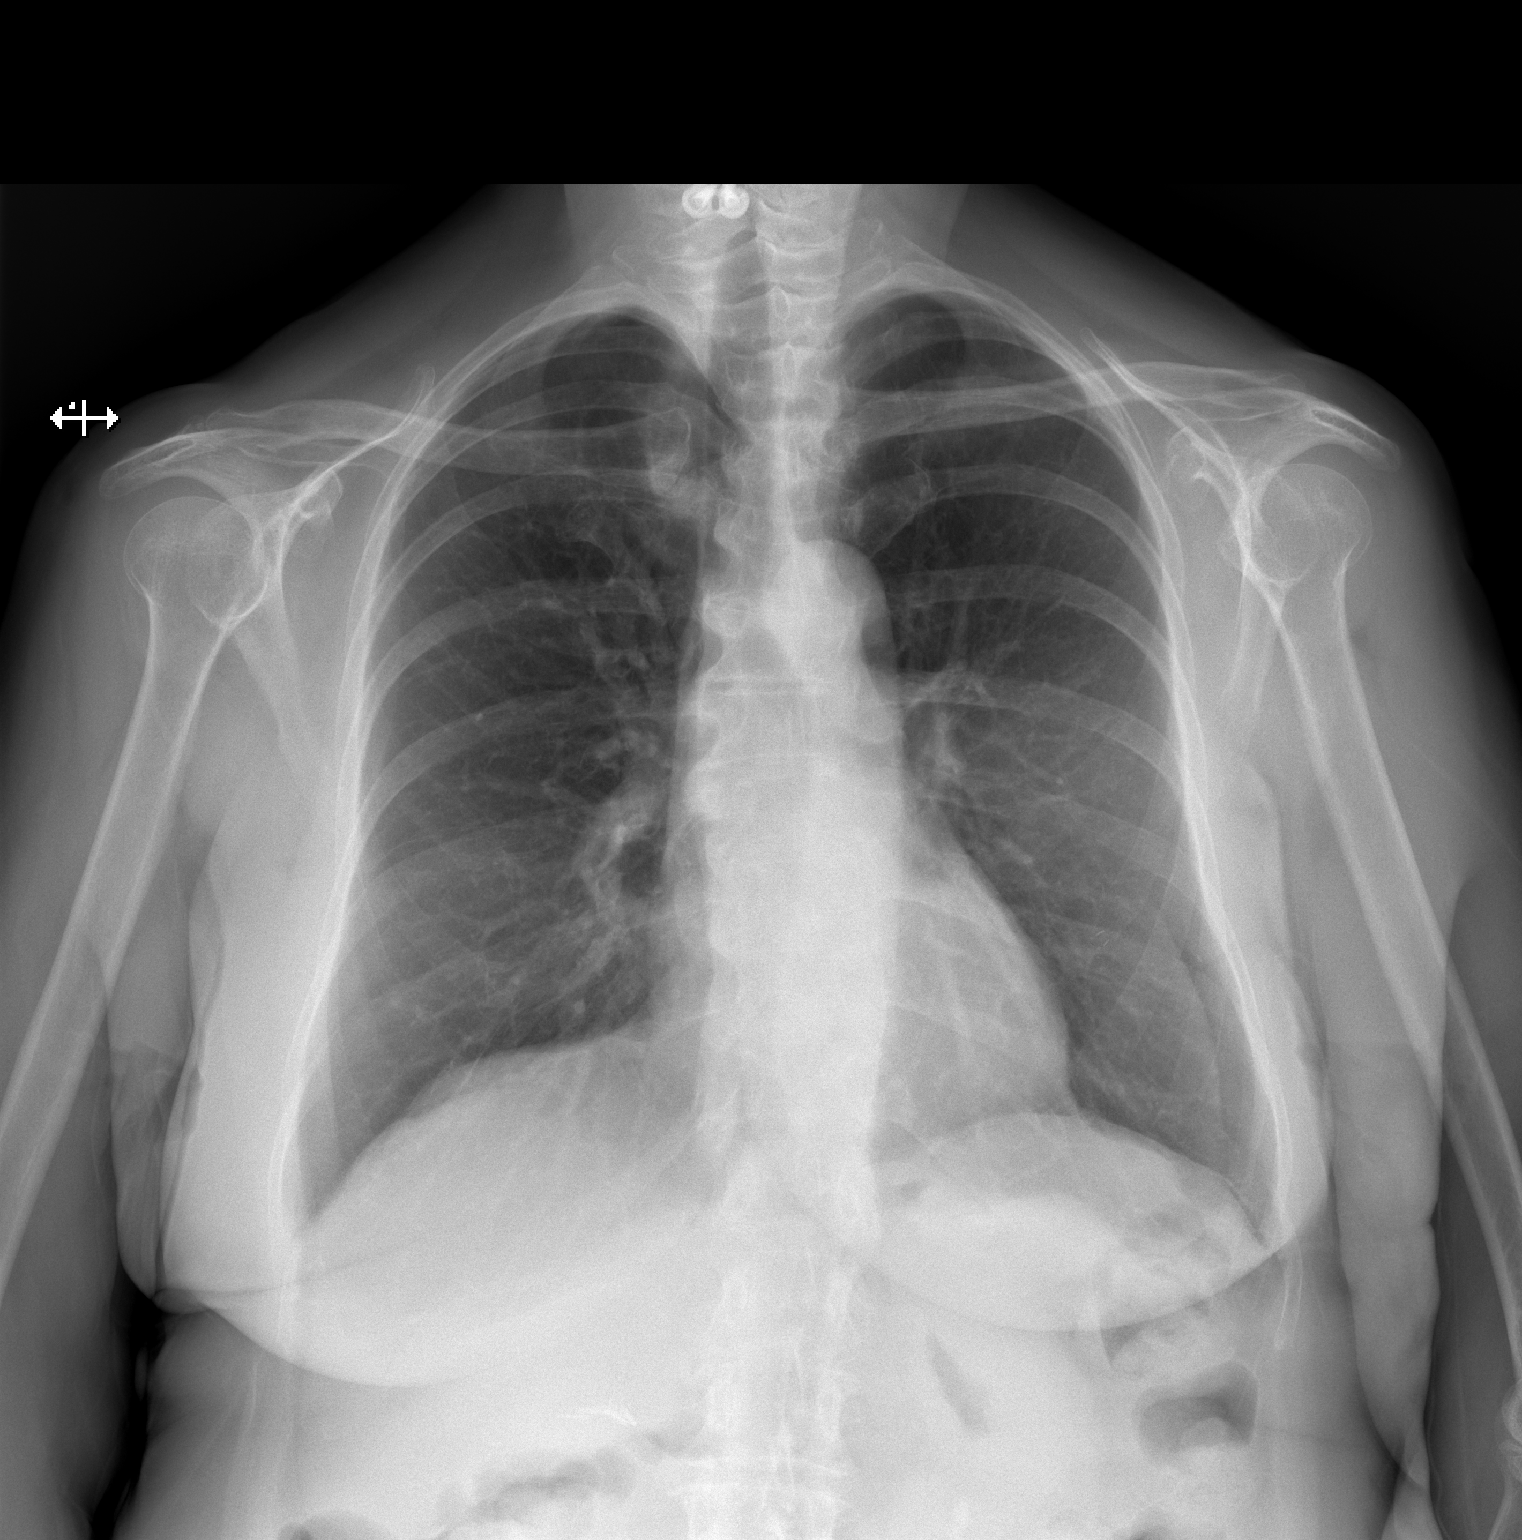

[w chest lat]
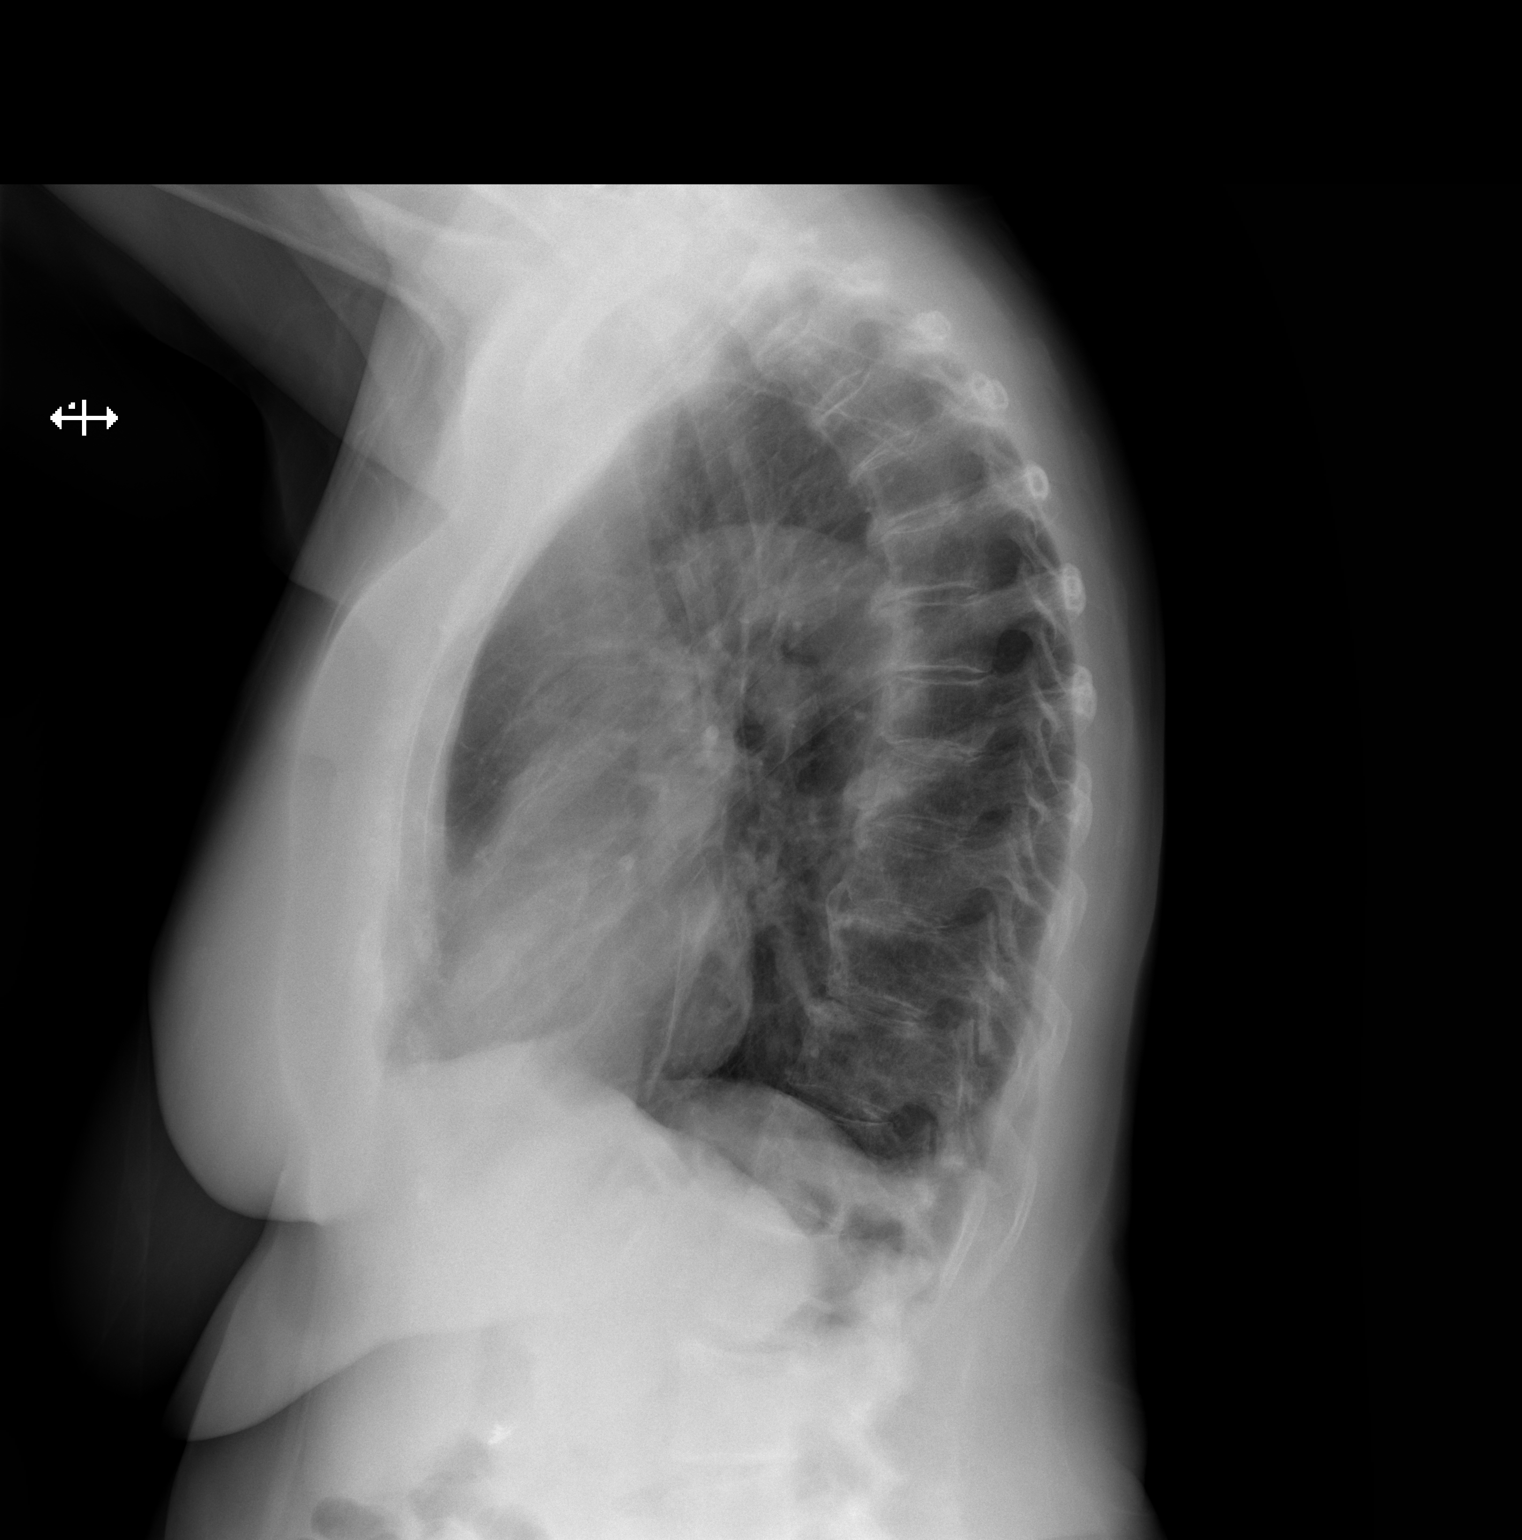

[2 of 2 positions shown; findings below may reference images not displayed]

FINDINGS: No focal consolidation, pleural effusion, pneumothorax. The cardiac
silhouette is within limits. Degenerative changes of the spine. No
acute osseous pathology. Lower cervical ACDF. Right upper quadrant
cholecystectomy clips.
IMPRESSION: No active cardiopulmonary disease.

## 2023-11-16 ENCOUNTER — Encounter: Payer: Self-pay | Admitting: Family Medicine

## 2023-11-16 ENCOUNTER — Ambulatory Visit (INDEPENDENT_AMBULATORY_CARE_PROVIDER_SITE_OTHER): Payer: Medicare Other | Admitting: Family Medicine

## 2023-11-16 VITALS — BP 116/62 | HR 72 | Temp 97.7°F | Ht <= 58 in | Wt 118.8 lb

## 2023-11-16 DIAGNOSIS — R7303 Prediabetes: Secondary | ICD-10-CM | POA: Diagnosis not present

## 2023-11-16 DIAGNOSIS — R300 Dysuria: Secondary | ICD-10-CM

## 2023-11-16 DIAGNOSIS — R251 Tremor, unspecified: Secondary | ICD-10-CM | POA: Diagnosis not present

## 2023-11-16 LAB — URINALYSIS, ROUTINE W REFLEX MICROSCOPIC
Bilirubin Urine: NEGATIVE
Ketones, ur: NEGATIVE
Nitrite: NEGATIVE
Specific Gravity, Urine: 1.02 (ref 1.000–1.030)
Total Protein, Urine: 100 — AB
Urine Glucose: NEGATIVE
Urobilinogen, UA: 0.2 (ref 0.0–1.0)
pH: 7 (ref 5.0–8.0)

## 2023-11-16 NOTE — Progress Notes (Addendum)
 Established Patient Office Visit   Subjective:  Patient ID: Brenda Delgado, female    DOB: [birth year]  Age: 78 y.o. MRN: [id number]  Chief Complaint  Patient presents with   Medical Management of Chronic Issues    3 month follow up. Pt is fasting.      HPI Encounter Diagnoses  Name Primary?   Tremor of face and hands Yes   Dysuria    Prediabetes    Follow-up of above.  Metformin is working okay for her.  She believes that she can stick with that.  Tremor involving both of her hands and head at rest since 2011 has been stable.  Finished her course of Keflex for a recent UTI.  Dysuria has returned.  There was not enough urine given for culture.  There has been some urgency and frequency .   Review of Systems  Constitutional: Negative.  Negative for chills and fever.  HENT: Negative.    Eyes:  Negative for blurred vision, discharge and redness.  Respiratory: Negative.    Cardiovascular: Negative.   Gastrointestinal:  Negative for abdominal pain, nausea and vomiting.  Genitourinary:  Positive for dysuria, frequency and urgency. Negative for flank pain.  Musculoskeletal: Negative.  Negative for myalgias.  Skin:  Negative for rash.  Neurological:  Positive for tremors. Negative for tingling, loss of consciousness and weakness.  Endo/Heme/Allergies:  Negative for polydipsia.     Current Outpatient Medications:    ALBUTEROL IN, Inhale into the lungs as needed., Disp: , Rfl:    Apoaequorin (PREVAGEN PO), Take by mouth., Disp: , Rfl:    Ascorbic Acid (VITAMIN C) 1000 MG tablet, Take 1,000 mg by mouth daily., Disp: , Rfl:    aspirin EC 81 MG tablet, Take 81 mg by mouth daily., Disp: , Rfl:    Calcium-Vitamin D-Vitamin K 500-500-40 MG-UNT-MCG CHEW, Chew 2 tablets by mouth daily., Disp: , Rfl:    Cholecalciferol (VITAMIN D3) 50 MCG (2000 UT) TABS, Take 50 mcg by mouth 2 (two) times daily., Disp: , Rfl:    Coenzyme Q10 (COQ10 PO), Take 1 capsule by mouth daily., Disp: , Rfl:     EPINEPHrine 0.3 mg/0.3 mL IJ SOAJ injection, SMARTSIG:1 Pre-Filled Pen Syringe IM As Needed, Disp: , Rfl:    meloxicam (MOBIC) 15 MG tablet, Take 15 mg by mouth daily as needed for pain., Disp: , Rfl:    metFORMIN (GLUCOPHAGE-XR) 500 MG 24 hr tablet, Take 1 tablet (500 mg total) by mouth at bedtime., Disp: 90 tablet, Rfl: 1   mometasone (ASMANEX) 220 MCG/ACT inhaler, Inhale 2 puffs into the lungs daily., Disp: 1 each, Rfl: 5   mometasone (NASONEX) 50 MCG/ACT nasal spray, Place 2 sprays into the nose daily., Disp: 1 each, Rfl: 12   Multiple Vitamins-Minerals (OCUVITE PO), Take by mouth., Disp: , Rfl:    Multiple Vitamins-Minerals (PRESERVISION AREDS 2) CAPS, Take 2 capsules by mouth daily., Disp: , Rfl:    nitrofurantoin, macrocrystal-monohydrate, (MACROBID) 100 MG capsule, Take 1 capsule (100 mg total) by mouth 2 (two) times daily for 7 days., Disp: 14 capsule, Rfl: 0   omega-3 acid ethyl esters (LOVAZA) 1 g capsule, Take by mouth 2 (two) times daily., Disp: , Rfl:    pantoprazole (PROTONIX) 40 MG tablet, Take 1 tablet (40 mg total) by mouth at bedtime., Disp: 90 tablet, Rfl: 3   PARoxetine (PAXIL) 20 MG tablet, Take 1 tablet (20 mg total) by mouth daily., Disp: 90 tablet, Rfl: 3   tamoxifen (NOLVADEX) 10  MG tablet, Take 1 tablet (10 mg total) by mouth daily., Disp: 90 tablet, Rfl: 4   Turmeric 400 MG CAPS, Take by mouth., Disp: , Rfl:    zinc gluconate 50 MG tablet, Take 50 mg by mouth daily., Disp: , Rfl:    Objective:     BP 116/62 (BP Location: Right Arm, Patient Position: Sitting, Cuff Size: Normal)   Pulse 72   Temp 97.7 F (36.5 C) (Temporal)   Ht 4\' 10"  (1.473 m)   Wt 118 lb 12.8 oz (53.9 kg)   SpO2 98%   BMI 24.83 kg/m    Physical Exam Constitutional:      General: She is not in acute distress.    Appearance: Normal appearance. She is not ill-appearing, toxic-appearing or diaphoretic.  HENT:     Head: Normocephalic and atraumatic.     Right Ear: External ear normal.      Left Ear: External ear normal.  Eyes:     General: No scleral icterus.       Right eye: No discharge.        Left eye: No discharge.     Extraocular Movements: Extraocular movements intact.     Conjunctiva/sclera: Conjunctivae normal.  Pulmonary:     Effort: Pulmonary effort is normal. No respiratory distress.  Skin:    General: Skin is warm and dry.  Neurological:     Mental Status: She is alert and oriented to person, place, and time.     Motor: Tremor (Resting tremor involving both hands and head noted.) present.  Psychiatric:        Mood and Affect: Mood normal.        Behavior: Behavior normal.      Results for orders placed or performed in visit on 11/16/23  Urine Culture   Specimen: Urine  Result Value Ref Range   MICRO NUMBER: [id number]    SPECIMEN QUALITY: Adequate    Sample Source URINE, CLEAN CATCH    STATUS: FINAL    ISOLATE 1: Klebsiella pneumoniae (A)       Susceptibility   Klebsiella pneumoniae - URINE CULTURE, REFLEX    AMOX/CLAVULANIC <=2 Sensitive     AMPICILLIN 16 Resistant     AMPICILLIN/SULBACTAM <=2 Sensitive     CEFAZOLIN* <=4 Not Reportable      * For infections other than uncomplicated UTI caused by E. coli, K. pneumoniae or P. mirabilis: Cefazolin is resistant if MIC > or = 8 mcg/mL. (Distinguishing susceptible versus intermediate for isolates with MIC < or = 4 mcg/mL requires additional testing.) For uncomplicated UTI caused by E. coli, K. pneumoniae or P. mirabilis: Cefazolin is susceptible if MIC <32 mcg/mL and predicts susceptible to the oral agents cefaclor, cefdinir, cefpodoxime, cefprozil, cefuroxime, cephalexin and loracarbef.     CEFTAZIDIME <=1 Sensitive     CEFEPIME <=1 Sensitive     CEFTRIAXONE <=1 Sensitive     CIPROFLOXACIN <=0.25 Sensitive     LEVOFLOXACIN <=0.12 Sensitive     GENTAMICIN <=1 Sensitive     IMIPENEM <=0.25 Sensitive     NITROFURANTOIN 32 Sensitive     PIP/TAZO <=4 Sensitive     TOBRAMYCIN <=1 Sensitive      TRIMETH/SULFA* <=20 Sensitive      * For infections other than uncomplicated UTI caused by E. coli, K. pneumoniae or P. mirabilis: Cefazolin is resistant if MIC > or = 8 mcg/mL. (Distinguishing susceptible versus intermediate for isolates with MIC < or = 4 mcg/mL requires additional testing.) For  uncomplicated UTI caused by E. coli, K. pneumoniae or P. mirabilis: Cefazolin is susceptible if MIC <32 mcg/mL and predicts susceptible to the oral agents cefaclor, cefdinir, cefpodoxime, cefprozil, cefuroxime, cephalexin and loracarbef. Legend: S = Susceptible  I = Intermediate R = Resistant  NS = Not susceptible SDD = Susceptible Dose Dependent * = Not Tested  NR = Not Reported **NN = See Therapy Comments   Urinalysis, Routine w reflex microscopic  Result Value Ref Range   Color, Urine YELLOW Yellow;Lt. Yellow;Straw;Dark Yellow;Amber;Green;Red;Brown   APPearance CLEAR Clear;Turbid;Slightly Cloudy;Cloudy   Specific Gravity, Urine 1.020 1.000 - 1.030   pH 7.0 5.0 - 8.0   Total Protein, Urine 100 (A) Negative   Urine Glucose NEGATIVE Negative   Ketones, ur NEGATIVE Negative   Bilirubin Urine NEGATIVE Negative   Hgb urine dipstick SMALL (A) Negative   Urobilinogen, UA 0.2 0.0 - 1.0   Leukocytes,Ua MODERATE (A) Negative   Nitrite NEGATIVE Negative   WBC, UA 21-50/hpf (A) 0-2/hpf   RBC / HPF 3-6/hpf (A) 0-2/hpf   Squamous Epithelial / HPF Rare(0-4/hpf) Rare(0-4/hpf)      The 10-year ASCVD risk score (Arnett DK, et al., 2019) is: 16.7%    Assessment & Plan:   Tremor of face and hands  Dysuria -     Urinalysis, Routine w reflex microscopic -     Urine Culture -     Nitrofurantoin Monohyd Macro; Take 1 capsule (100 mg total) by mouth 2 (two) times daily for 7 days.  Dispense: 14 capsule; Refill: 0  Prediabetes    Return in about 3 months (around 02/15/2024), or if symptoms worsen or fail to improve.  Am hesitant to provide treatment for patient's tremor.  Information was  given.  Discussed the possibility of neurology consult.  Both patient and her husband would like to hold off for now.  Tonna Frederic, MD  4/14 addendum: Culture returned weakly positive with Klebsiella.  Patient has multiple drug allergies.  Will treat with a 7-day course of Macrobid.

## 2023-11-18 LAB — URINE CULTURE
MICRO NUMBER:: 16319478
SPECIMEN QUALITY:: ADEQUATE

## 2023-11-19 MED ORDER — NITROFURANTOIN MONOHYD MACRO 100 MG PO CAPS: 100.0000 mg | ORAL_CAPSULE | Freq: Two times a day (BID) | ORAL | 0 refills | Status: AC

## 2023-11-19 NOTE — Addendum Note (Signed)
 Addended by: Delene Feinstein on: 11/19/2023 09:26 AM   Modules accepted: Orders

## 2023-11-29 DIAGNOSIS — M9901 Segmental and somatic dysfunction of cervical region: Secondary | ICD-10-CM | POA: Diagnosis not present

## 2023-11-29 DIAGNOSIS — M5451 Vertebrogenic low back pain: Secondary | ICD-10-CM | POA: Diagnosis not present

## 2023-11-29 DIAGNOSIS — M9903 Segmental and somatic dysfunction of lumbar region: Secondary | ICD-10-CM | POA: Diagnosis not present

## 2023-11-29 DIAGNOSIS — M5415 Radiculopathy, thoracolumbar region: Secondary | ICD-10-CM | POA: Diagnosis not present

## 2023-11-30 ENCOUNTER — Other Ambulatory Visit: Payer: Self-pay | Admitting: Family Medicine

## 2023-11-30 DIAGNOSIS — F418 Other specified anxiety disorders: Secondary | ICD-10-CM

## 2023-12-10 ENCOUNTER — Encounter: Payer: Self-pay | Admitting: Nurse Practitioner

## 2023-12-10 ENCOUNTER — Ambulatory Visit: Payer: Self-pay

## 2023-12-10 ENCOUNTER — Ambulatory Visit: Admitting: Nurse Practitioner

## 2023-12-10 VITALS — BP 114/62 | HR 79 | Temp 97.2°F | Ht <= 58 in | Wt 118.4 lb

## 2023-12-10 DIAGNOSIS — R309 Painful micturition, unspecified: Secondary | ICD-10-CM | POA: Diagnosis not present

## 2023-12-10 DIAGNOSIS — N390 Urinary tract infection, site not specified: Secondary | ICD-10-CM | POA: Insufficient documentation

## 2023-12-10 DIAGNOSIS — R8290 Unspecified abnormal findings in urine: Secondary | ICD-10-CM | POA: Diagnosis not present

## 2023-12-10 LAB — POC URINALSYSI DIPSTICK (AUTOMATED)
Bilirubin, UA: NEGATIVE
Blood, UA: NEGATIVE
Glucose, UA: NEGATIVE
Ketones, UA: NEGATIVE
Nitrite, UA: NEGATIVE
Protein, UA: NEGATIVE
Spec Grav, UA: <=1.005 — AB (ref 1.010–1.025)
Urobilinogen, UA: 0.2 U/dL
pH, UA: 6 (ref 5.0–8.0)

## 2023-12-10 MED ORDER — NITROFURANTOIN MONOHYD MACRO 100 MG PO CAPS: 100.0000 mg | ORAL_CAPSULE | Freq: Two times a day (BID) | ORAL | 0 refills | Status: DC

## 2023-12-10 MED ORDER — CEFTRIAXONE SODIUM 1 G IJ SOLR
1.0000 g | Freq: Once | INTRAMUSCULAR | Status: AC
  Administered 2023-12-10: 1 g via INTRAMUSCULAR

## 2023-12-10 NOTE — Progress Notes (Signed)
 Acute Office Visit  Subjective:     Patient ID: Brenda Delgado, female    DOB: [birth year], 78 y.o.   MRN: [id number]  Chief Complaint  Patient presents with   Urinary Tract Infection    Ongoing since March 2025    HPI Discussed the use of AI scribe software for clinical note transcription with the patient, who gave verbal consent to proceed.  History of Present Illness   Brenda Delgado is a 78 year old female with recurrent urinary tract infections who presents with persistent symptoms despite multiple antibiotic treatments. She is accompanied by her husband.  She has experienced burning during urination and fatigue since March. Despite four courses of antibiotics, her symptoms persist. She had a fever of 100.62F last week for a couple of days. There is no back pain, hematuria, or unusual odor. She drinks plenty of water  and has tried cranberry juice. She has not taken any medications other than the prescribed antibiotics. There is no vaginal discharge, itching, or irritation.      ROS See pertinent positives and negatives per HPI.     Objective:    BP 114/62 (BP Location: Left Arm, Patient Position: Sitting, Cuff Size: Normal)   Pulse 79   Temp (!) 97.2 F (36.2 C)   Ht 4\' 10"  (1.473 m)   Wt 118 lb 6.4 oz (53.7 kg)   SpO2 97%   BMI 24.75 kg/m    Physical Exam Vitals and nursing note reviewed.  Constitutional:      General: She is not in acute distress.    Appearance: Normal appearance.  HENT:     Head: Normocephalic.  Eyes:     Conjunctiva/sclera: Conjunctivae normal.  Pulmonary:     Effort: Pulmonary effort is normal.  Abdominal:     General: There is no distension.     Tenderness: There is no abdominal tenderness. There is no right CVA tenderness, left CVA tenderness or guarding.  Musculoskeletal:     Cervical back: Normal range of motion.  Skin:    General: Skin is warm.  Neurological:     General: No focal deficit present.     Mental Status: She is  alert and oriented to person, place, and time.  Psychiatric:        Mood and Affect: Mood normal.        Behavior: Behavior normal.        Thought Content: Thought content normal.        Judgment: Judgment normal.     Results for orders placed or performed in visit on 12/10/23  POCT Urinalysis Dipstick (Automated)  Result Value Ref Range   Color, UA     Clarity, UA     Glucose, UA Negative Negative   Bilirubin, UA Negative    Ketones, UA Negative    Spec Grav, UA <=1.005 (A) 1.010 - 1.025   Blood, UA Negative    pH, UA 6.0 5.0 - 8.0   Protein, UA Negative Negative   Urobilinogen, UA 0.2 0.2 or 1.0 E.U./dL   Nitrite, UA Negative    Leukocytes, UA Large (3+) (A) Negative        Assessment & Plan:   Problem List Items Addressed This Visit       Genitourinary   Recurrent UTI - Primary   She has a recurrent UTI with dysuria and low-grade fever. The urine culture is sensitive to most antibiotics except ampicillin, and previous treatments have been ineffective. She also has  allergies to multiple antibiotics. Administer Rocephin  1g IM injection and prescribe Macrobid  BID for two weeks. Advise cranberry tablets to avoid high sugar intake and encourage increased fluid intake. Refer to urology if symptoms persist.      Relevant Medications   nitrofurantoin , macrocrystal-monohydrate, (MACROBID ) 100 MG capsule   Other Relevant Orders   Urine Culture   Other Visit Diagnoses       Pain with urination       U/A positive for 3+ leukocytes. Urine culture prior showed klebsiella pneumoniae.   Relevant Orders   POCT Urinalysis Dipstick (Automated) (Completed)   Urine Culture       Meds ordered this encounter  Medications   cefTRIAXone  (ROCEPHIN ) injection 1 g   nitrofurantoin , macrocrystal-monohydrate, (MACROBID ) 100 MG capsule    Sig: Take 1 capsule (100 mg total) by mouth 2 (two) times daily.    Dispense:  28 capsule    Refill:  0    Return if symptoms worsen or fail to  improve.  Brenda Delgado

## 2023-12-10 NOTE — Patient Instructions (Signed)
 It was great to see you!  Start drinking cranberry juice once a day or a tablet   We are giving you rocephin  shot today and then take macrobid  twice a day for 2 weeks   Keep drinking plenty of fluids  Reach out if you are still having symptoms and we will refer you to urology.   Let's follow-up if symptoms worsen or don't improve.   Take care,  Rheba Cedar, NP

## 2023-12-10 NOTE — Telephone Encounter (Signed)
 Patient already triaged for this issue.   Copied From CRM [id number]. Reason for Triage: ongoing UTI issues ,abdominal pain, has been seen 2 other times,antibiotics  not helping

## 2023-12-10 NOTE — Assessment & Plan Note (Signed)
 She has a recurrent UTI with dysuria and low-grade fever. The urine culture is sensitive to most antibiotics except ampicillin, and previous treatments have been ineffective. She also has allergies to multiple antibiotics. Administer Rocephin  1g IM injection and prescribe Macrobid  BID for two weeks. Advise cranberry tablets to avoid high sugar intake and encourage increased fluid intake. Refer to urology if symptoms persist.

## 2023-12-10 NOTE — Telephone Encounter (Signed)
 Chief Complaint: Dysuria Symptoms: burning Frequency: x 5 weeks Pertinent Negatives: Patient denies decreased urination, hematuria Disposition: [] ED /[] Urgent Care (no appt availability in office) / [x] Appointment(In office/virtual)/ []  [facility]/ [] Home Care/ [] Refused Recommended Disposition /[] [facility]/ []  Follow-up with PCP Additional Notes: Pt reports she continues with UTI symptoms that have been present for about 5 weeks. Pt notes they seemed to improve after most recent course of abx but have since returned and worsened. Pt notes constant vaginal burning in addition to dysuria. Denies hematuria. OV scheduled. This RN educated pt on home care, new-worsening symptoms, when to call back/seek emergent care. Pt verbalized understanding and agrees to plan.    Copied from CRM [id number]. Topic: Clinical - Red Word Triage >> Dec 10, 2023 10:20 AM Brenda Delgado wrote: Red Word that prompted transfer to Nurse Triage: Patient had a UTI for 5 weeks and she said she has tried a few antibiotics and nothing is helping. She is in pain and burning is unbearable. Reason for Disposition  [1] SEVERE pain (e.g., excruciating) AND [2] no improvement 2 hours after pain medications  Answer Assessment - Initial Assessment Questions 1. MAIN SYMPTOM: "What is the main symptom you are concerned about?" (e.g., painful urination, urine frequency)     Dysuria 2. BETTER-SAME-WORSE: "Are you getting better, staying the same, or getting worse compared to how you felt at your last visit to the doctor (most recent medical visit)?"     Started to improve, now worsening 3. PAIN: "How bad is the pain?"  (e.g., Scale 1-10; mild, moderate, or severe)   - MILD (1-3): complains slightly about urination hurting   - MODERATE (4-7): interferes with normal activities     - SEVERE (8-10): excruciating, unwilling or unable to urinate because of the pain      8/10 4. FEVER: "Do you have a fever?" If Yes, ask: "What  is it, how was it measured, and when did it start?"     Last week x 2 days fever at 100.2 5. OTHER SYMPTOMS: "Do you have any other symptoms?" (e.g., blood in the urine, flank pain, vaginal discharge)     None 6. DIAGNOSIS: "When was the UTI diagnosed?" "By whom?" "Was it a kidney infection, bladder infection or both?"     Gavin Kast, FNP  Protocols used: Urinary Tract Infection on Antibiotic Follow-up Call - [facility]

## 2023-12-12 ENCOUNTER — Encounter: Payer: Self-pay | Admitting: Nurse Practitioner

## 2023-12-12 DIAGNOSIS — K080 Exfoliation of teeth due to systemic causes: Secondary | ICD-10-CM | POA: Diagnosis not present

## 2023-12-12 LAB — URINE CULTURE
MICRO NUMBER:: 16413035
SPECIMEN QUALITY:: ADEQUATE

## 2023-12-17 ENCOUNTER — Telehealth: Payer: Self-pay

## 2023-12-17 NOTE — Telephone Encounter (Signed)
 Unsuccessful attempts to reach patient on preferred number listed in notes for scheduled AWV. Left message on voicemail okay to reschedule.

## 2023-12-26 DIAGNOSIS — S2232XA Fracture of one rib, left side, initial encounter for closed fracture: Secondary | ICD-10-CM | POA: Diagnosis not present

## 2023-12-27 DIAGNOSIS — M9901 Segmental and somatic dysfunction of cervical region: Secondary | ICD-10-CM | POA: Diagnosis not present

## 2023-12-27 DIAGNOSIS — M5415 Radiculopathy, thoracolumbar region: Secondary | ICD-10-CM | POA: Diagnosis not present

## 2023-12-27 DIAGNOSIS — M9903 Segmental and somatic dysfunction of lumbar region: Secondary | ICD-10-CM | POA: Diagnosis not present

## 2023-12-27 DIAGNOSIS — M5451 Vertebrogenic low back pain: Secondary | ICD-10-CM | POA: Diagnosis not present

## 2023-12-31 ENCOUNTER — Other Ambulatory Visit: Payer: Self-pay | Admitting: Family Medicine

## 2023-12-31 DIAGNOSIS — K219 Gastro-esophageal reflux disease without esophagitis: Secondary | ICD-10-CM

## 2024-01-08 ENCOUNTER — Inpatient Hospital Stay: Payer: BC Managed Care – PPO | Attending: Hematology and Oncology | Admitting: Hematology and Oncology

## 2024-01-08 VITALS — BP 120/60 | HR 95 | Temp 97.8°F | Resp 16 | Ht <= 58 in | Wt 118.0 lb

## 2024-01-08 DIAGNOSIS — D0512 Intraductal carcinoma in situ of left breast: Secondary | ICD-10-CM | POA: Insufficient documentation

## 2024-01-08 DIAGNOSIS — Z923 Personal history of irradiation: Secondary | ICD-10-CM | POA: Insufficient documentation

## 2024-01-08 DIAGNOSIS — Z79810 Long term (current) use of selective estrogen receptor modulators (SERMs): Secondary | ICD-10-CM | POA: Insufficient documentation

## 2024-01-08 MED ORDER — TAMOXIFEN CITRATE 10 MG PO TABS: 10.0000 mg | ORAL_TABLET | Freq: Every day | ORAL | 3 refills | Status: AC

## 2024-01-08 NOTE — Assessment & Plan Note (Signed)
 07/28/2019:Routine screening mammogram detected a 1.9cm group of calcifications in the left breast. Biopsy showed DCIS, high grade, ER+ 95%, PR+ 90%. Tis NX stage 0 08/12/2019: Left lumpectomy: High-grade DCIS with necrosis and calcifications 1.5 cm, margins negative, ER 95%, PR 90%   Treatment plan: 1.  Adjuvant radiation therapy 09/11/2019-10/06/2019 2.  Follow-up adjuvant antiestrogen therapy with tamoxifen  x5 years started 10/19/20 ------------------------------------------------------------------------------------------------------------------------ Tamoxifen  Toxicities: Tolerating it extremely well without any problems or concerns Denies any hot flashes or arthralgias or myalgias.   Multiple drainage procedures for seroma: Resolved   Breast Cancer Surveillance: 1. Breast Exam: 01/08/2024: Benign 2. Mammogram: 06/26/2023: [facility] Benign breast density category A     Return to clinic in 1 year for follow-up

## 2024-01-08 NOTE — Progress Notes (Signed)
 Patient Care Team: Tonna Frederic, MD as PCP - General (Family Medicine) Auther Bo, RN as Oncology Nurse Navigator Alane Hsu, RN as Oncology Nurse Navigator Juanita Norlander, MD as Consulting Physician (General Surgery) Cameron Cea, MD as Consulting Physician (Hematology and Oncology) Colie Dawes, MD as Attending Physician (Radiation Oncology)  DIAGNOSIS:  Encounter Diagnosis  Name Primary?   Ductal carcinoma in situ (DCIS) of left breast Yes    SUMMARY OF ONCOLOGIC HISTORY: Oncology History  Ductal carcinoma in situ (DCIS) of left breast  07/28/2019 Initial Diagnosis   Routine screening mammogram detected a 1.9cm group of calcifications in the left breast. Biopsy showed DCIS, high grade, ER+ 95%, PR+ 90%.   07/30/2019 Cancer Staging   Staging form: Breast, AJCC 8th Edition - Clinical: Stage 0 (cTis (DCIS), cN0, cM0, G3, ER+, PR+, HER2: Not Assessed)    08/12/2019 Genetic Testing   Negative genetic testing. No pathogenic variants identified, VUS in MSH2 Gain (Exons 11-16) identified on the [facility] Common Hereditary Cancers Panel + Breast Cancer STAT Panel. The STAT Breast cancer panel offered by [facility] includes sequencing and rearrangement analysis for the following 9 genes:  ATM, BRCA1, BRCA2, CDH1, CHEK2, PALB2, PTEN, STK11 and TP53.  The Common Hereditary Cancers Panel offered by [facility] includes sequencing and/or deletion duplication testing of the following 47 genes: APC, ATM, AXIN2, BARD1, BMPR1A, BRCA1, BRCA2, BRIP1, CDH1, CDKN2A (p14ARF), CDKN2A (p16INK4a), CKD4, CHEK2, CTNNA1, DICER1, EPCAM (Deletion/duplication testing only), GREM1 (promoter region deletion/duplication testing only), KIT, MEN1, MLH1, MSH2, MSH3, MSH6, MUTYH, NBN, NF1, NHTL1, PALB2, PDGFRA, PMS2, POLD1, POLE, PTEN, RAD50, RAD51C, RAD51D, SDHB, SDHC, SDHD, SMAD4, SMARCA4. STK11, TP53, TSC1, TSC2, and VHL.  The following genes were evaluated for sequence changes only: SDHA and HOXB13 c.251G>A  variant only. The report date is 08/12/2019.   08/12/2019 Cancer Staging   Staging form: Breast, AJCC 8th Edition - Pathologic stage from 08/12/2019: Stage 0 (pTis (DCIS), pN0, cM0, ER+, PR+)   08/12/2019 Surgery   Left Breast Lumpectomy [place]) [phone]): high grade DCIS with calcifications and necrosis, spanning 1.5cm. Additional left lateral, medial, and superior magrins showed fibrocystic changes. No regional lymph nodes were examined. ER+ PR+.   09/10/2019 - 10/07/2019 Radiation Therapy   The patient initially received a dose of 40.05 Gy in 15 fractions to the breast using whole-breast tangent fields. This was delivered using a 3-D conformal technique. The pt received a boost delivering an additional 10 Gy in 5 fractions using a electron boost with electrons. The total dose was 50.05 Gy.   10/2019 - 10/2024 Anti-estrogen oral therapy   Tamoxifen      CHIEF COMPLIANT: Follow-up on tamoxifen  therapy  HISTORY OF PRESENT ILLNESS:  History of Present Illness Brenda Delgado is a 78 year old female with a history of breast cancer who presents for a follow-up visit.  She has been on tamoxifen  since March 2022 without issues, with approximately a year and a half remaining on this treatment. Annual mammograms in November have been normal. She occasionally experiences aching at the incision site, but no other breast-related issues are present.  She sustained a rib fracture two to three weeks ago while doing yard work, causing significant pain, especially when wearing a bra. She chose not to have the rib wrapped due to potential discomfort.  Her blood pressure is 120/60, which she considers acceptable. No current urinary tract infection or other significant symptoms are present.     ALLERGIES:  is allergic to pravastatin , erythromycin, iohexol , amoxicillin-pot  clavulanate, codeine, moxifloxacin, and sulfamethoxazole.  MEDICATIONS:  Current Outpatient Medications  Medication Sig Dispense  Refill   ALBUTEROL IN Inhale into the lungs as needed.     Ascorbic Acid (VITAMIN C) 1000 MG tablet Take 1,000 mg by mouth daily.     aspirin EC 81 MG tablet Take 81 mg by mouth daily.     Calcium-Vitamin D-Vitamin K 500-500-40 MG-UNT-MCG CHEW Chew 2 tablets by mouth daily.     Cholecalciferol (VITAMIN D3) 50 MCG (2000 UT) TABS Take 50 mcg by mouth 2 (two) times daily.     Coenzyme Q10 (COQ10 PO) Take 1 capsule by mouth daily.     EPINEPHrine  0.3 mg/0.3 mL IJ SOAJ injection SMARTSIG:1 Pre-Filled Pen Syringe IM As Needed     meloxicam (MOBIC) 15 MG tablet Take 15 mg by mouth daily as needed for pain.     metFORMIN  (GLUCOPHAGE -XR) 500 MG 24 hr tablet Take 1 tablet (500 mg total) by mouth at bedtime. 90 tablet 1   Multiple Vitamins-Minerals (OCUVITE PO) Take by mouth.     omega-3 acid ethyl esters (LOVAZA) 1 g capsule Take by mouth 2 (two) times daily.     pantoprazole  (PROTONIX ) 40 MG tablet TAKE 1 TABLET BY MOUTH AT BEDTIME 90 tablet 0   PARoxetine  (PAXIL ) 20 MG tablet Take 1 tablet by mouth once daily 90 tablet 0   tamoxifen  (NOLVADEX ) 10 MG tablet Take 1 tablet (10 mg total) by mouth daily. 90 tablet 4   Turmeric 400 MG CAPS Take by mouth.     zinc gluconate 50 MG tablet Take 50 mg by mouth daily.     Apoaequorin (PREVAGEN PO) Take by mouth. (Patient not taking: Reported on 01/08/2024)     mometasone  (ASMANEX ) 220 MCG/ACT inhaler Inhale 2 puffs into the lungs daily. (Patient not taking: Reported on 01/08/2024) 1 each 5   mometasone  (NASONEX ) 50 MCG/ACT nasal spray Place 2 sprays into the nose daily. (Patient not taking: Reported on 01/08/2024) 1 each 12   No current facility-administered medications for this visit.    PHYSICAL EXAMINATION: ECOG PERFORMANCE STATUS: 1 - Symptomatic but completely ambulatory  Vitals:   01/08/24 1000  BP: 120/60  Pulse: 95  Resp: 16  Temp: 97.8 F (36.6 C)  SpO2: 98%   Filed Weights   01/08/24 1000  Weight: 118 lb (53.5 kg)    Physical Exam VITALS:  BP- 120/60  (exam performed in the presence of a chaperone)  LABORATORY DATA:  I have reviewed the data as listed    Latest Ref Rng & Units 08/17/2023   10:14 AM 04/18/2023    8:52 AM 12/12/2022    2:59 PM  CMP  Glucose 70 - 99 mg/dL 90  95  161   BUN 6 - 23 mg/dL 15  18  19    Creatinine 0.40 - 1.20 mg/dL 0.96  0.45  4.09   Sodium 135 - 145 mEq/L 142  141  142   Potassium 3.5 - 5.1 mEq/L 4.2  4.3  4.2   Chloride 96 - 112 mEq/L 105  103  103   CO2 19 - 32 mEq/L 30  31  30    Calcium 8.4 - 10.5 mg/dL 8.8  8.9  8.9   Total Protein 6.0 - 8.3 g/dL  6.0    Total Bilirubin 0.2 - 1.2 mg/dL  0.6    Alkaline Phos 39 - 117 U/L  50    AST 0 - 37 U/L  21  ALT 0 - 35 U/L  14      Lab Results  Component Value Date   WBC 3.2 (L) 04/18/2023   HGB 13.7 04/18/2023   HCT 42.8 04/18/2023   MCV 93.8 04/18/2023   PLT 274.0 04/18/2023   NEUTROABS 1.7 07/30/2019    ASSESSMENT & PLAN:  Ductal carcinoma in situ (DCIS) of left breast 07/28/2019:Routine screening mammogram detected a 1.9cm group of calcifications in the left breast. Biopsy showed DCIS, high grade, ER+ 95%, PR+ 90%. Tis NX stage 0 08/12/2019: Left lumpectomy: High-grade DCIS with necrosis and calcifications 1.5 cm, margins negative, ER 95%, PR 90%   Treatment plan: 1.  Adjuvant radiation therapy 09/11/2019-10/06/2019 2.  Follow-up adjuvant antiestrogen therapy with tamoxifen  x5 years started 10/19/20 ------------------------------------------------------------------------------------------------------------------------ Tamoxifen  Toxicities: Tolerating it extremely well without any problems or concerns Denies any hot flashes or arthralgias or myalgias.   Multiple drainage procedures for seroma: Resolved   Breast Cancer Surveillance: 1. Breast Exam: 01/08/2024: Benign 2. Mammogram: 06/26/2023: [facility] Benign breast density category A     Return to clinic in 1 year for follow-up ------------------------------------- Assessment and  Plan Assessment & Plan Ductal carcinoma in situ (DCIS) of left breast DCIS managed with tamoxifen . No fluid accumulation. Scar tissue causes occasional aching. Normal annual mammograms. - Renew tamoxifen  prescription at [facility] in [facility]. - Continue annual mammograms in November. - Follow up in one year.  Rib fracture Rib fracture from fall. Persistent pain, declined wrapping.  General Health Maintenance Discussed cancer screening. No increased cancer risk from DCIS. Colonoscopy every ten years if no polyps. Pap smears not needed. Continue annual mammograms. - Discuss colonoscopy schedule with primary care provider. - Continue annual mammograms.      No orders of the defined types were placed in this encounter.  The patient has a good understanding of the overall plan. she agrees with it. she will call with any problems that may develop before the next visit here. Total time spent: 30 mins including face to face time and time spent for planning, charting and co-ordination of care   Viinay K Tamella Tuccillo, MD 01/08/24

## 2024-01-18 DIAGNOSIS — H43813 Vitreous degeneration, bilateral: Secondary | ICD-10-CM | POA: Diagnosis not present

## 2024-01-18 DIAGNOSIS — H524 Presbyopia: Secondary | ICD-10-CM | POA: Diagnosis not present

## 2024-01-18 DIAGNOSIS — H04123 Dry eye syndrome of bilateral lacrimal glands: Secondary | ICD-10-CM | POA: Diagnosis not present

## 2024-01-18 DIAGNOSIS — H52223 Regular astigmatism, bilateral: Secondary | ICD-10-CM | POA: Diagnosis not present

## 2024-01-18 DIAGNOSIS — H353123 Nonexudative age-related macular degeneration, left eye, advanced atrophic without subfoveal involvement: Secondary | ICD-10-CM | POA: Diagnosis not present

## 2024-01-18 DIAGNOSIS — H353112 Nonexudative age-related macular degeneration, right eye, intermediate dry stage: Secondary | ICD-10-CM | POA: Diagnosis not present

## 2024-01-18 DIAGNOSIS — H5213 Myopia, bilateral: Secondary | ICD-10-CM | POA: Diagnosis not present

## 2024-02-07 DIAGNOSIS — M5451 Vertebrogenic low back pain: Secondary | ICD-10-CM | POA: Diagnosis not present

## 2024-02-07 DIAGNOSIS — M9903 Segmental and somatic dysfunction of lumbar region: Secondary | ICD-10-CM | POA: Diagnosis not present

## 2024-02-07 DIAGNOSIS — M5415 Radiculopathy, thoracolumbar region: Secondary | ICD-10-CM | POA: Diagnosis not present

## 2024-02-07 DIAGNOSIS — M9901 Segmental and somatic dysfunction of cervical region: Secondary | ICD-10-CM | POA: Diagnosis not present

## 2024-02-12 NOTE — Addendum Note (Signed)
 Addended by: BERNETA ELSIE LABOR on: 02/12/2024 02:39 PM   Modules accepted: Orders

## 2024-02-13 DIAGNOSIS — H353132 Nonexudative age-related macular degeneration, bilateral, intermediate dry stage: Secondary | ICD-10-CM | POA: Diagnosis not present

## 2024-02-19 ENCOUNTER — Other Ambulatory Visit: Payer: Self-pay | Admitting: Family Medicine

## 2024-02-19 DIAGNOSIS — R7303 Prediabetes: Secondary | ICD-10-CM

## 2024-02-29 NOTE — Telephone Encounter (Unsigned)
 Copied from CRM [id number]. Topic: Clinical - Medical Advice >> Feb 29, 2024 10:49 AM Chasity T wrote: Reason for CRM: Candis pharmacy tech from [employer] is calling in regarding a medical claim they received and trying to get her in for a bone density scan. patient recent fracture in May and has questions. Please return call at [phone] ext [phone]

## 2024-03-03 ENCOUNTER — Telehealth: Payer: Self-pay | Admitting: Family Medicine

## 2024-03-03 NOTE — Telephone Encounter (Signed)
 Copied from CRM [id number]. Topic: General - Call Back - No Documentation >> Mar 03, 2024  2:34 PM Rea C wrote: Reason for CRM: Candis, Pharmacy Tech- [facility] called back and is  looking for Dr. Berneta to write a referral for a bone density scan for patient. She also provided call back number.  [phone] ext [phone]

## 2024-03-04 ENCOUNTER — Other Ambulatory Visit: Payer: Self-pay | Admitting: Family Medicine

## 2024-03-04 DIAGNOSIS — F418 Other specified anxiety disorders: Secondary | ICD-10-CM

## 2024-03-13 DIAGNOSIS — M9901 Segmental and somatic dysfunction of cervical region: Secondary | ICD-10-CM | POA: Diagnosis not present

## 2024-03-13 DIAGNOSIS — M9903 Segmental and somatic dysfunction of lumbar region: Secondary | ICD-10-CM | POA: Diagnosis not present

## 2024-03-13 DIAGNOSIS — M5451 Vertebrogenic low back pain: Secondary | ICD-10-CM | POA: Diagnosis not present

## 2024-03-13 DIAGNOSIS — M5415 Radiculopathy, thoracolumbar region: Secondary | ICD-10-CM | POA: Diagnosis not present

## 2024-03-14 DIAGNOSIS — H353132 Nonexudative age-related macular degeneration, bilateral, intermediate dry stage: Secondary | ICD-10-CM | POA: Diagnosis not present

## 2024-03-17 DIAGNOSIS — M9901 Segmental and somatic dysfunction of cervical region: Secondary | ICD-10-CM | POA: Diagnosis not present

## 2024-03-17 DIAGNOSIS — M9903 Segmental and somatic dysfunction of lumbar region: Secondary | ICD-10-CM | POA: Diagnosis not present

## 2024-03-17 DIAGNOSIS — M5451 Vertebrogenic low back pain: Secondary | ICD-10-CM | POA: Diagnosis not present

## 2024-03-17 DIAGNOSIS — M5415 Radiculopathy, thoracolumbar region: Secondary | ICD-10-CM | POA: Diagnosis not present

## 2024-03-28 ENCOUNTER — Other Ambulatory Visit: Payer: Self-pay | Admitting: Family Medicine

## 2024-03-28 DIAGNOSIS — K219 Gastro-esophageal reflux disease without esophagitis: Secondary | ICD-10-CM

## 2024-04-10 DIAGNOSIS — M5451 Vertebrogenic low back pain: Secondary | ICD-10-CM | POA: Diagnosis not present

## 2024-04-10 DIAGNOSIS — M5415 Radiculopathy, thoracolumbar region: Secondary | ICD-10-CM | POA: Diagnosis not present

## 2024-04-10 DIAGNOSIS — M9903 Segmental and somatic dysfunction of lumbar region: Secondary | ICD-10-CM | POA: Diagnosis not present

## 2024-04-10 DIAGNOSIS — M9901 Segmental and somatic dysfunction of cervical region: Secondary | ICD-10-CM | POA: Diagnosis not present

## 2024-04-13 DIAGNOSIS — H353132 Nonexudative age-related macular degeneration, bilateral, intermediate dry stage: Secondary | ICD-10-CM | POA: Diagnosis not present

## 2024-05-01 DIAGNOSIS — M9901 Segmental and somatic dysfunction of cervical region: Secondary | ICD-10-CM | POA: Diagnosis not present

## 2024-05-01 DIAGNOSIS — M5415 Radiculopathy, thoracolumbar region: Secondary | ICD-10-CM | POA: Diagnosis not present

## 2024-05-01 DIAGNOSIS — M5451 Vertebrogenic low back pain: Secondary | ICD-10-CM | POA: Diagnosis not present

## 2024-05-01 DIAGNOSIS — M9903 Segmental and somatic dysfunction of lumbar region: Secondary | ICD-10-CM | POA: Diagnosis not present

## 2024-05-08 DIAGNOSIS — M9903 Segmental and somatic dysfunction of lumbar region: Secondary | ICD-10-CM | POA: Diagnosis not present

## 2024-05-08 DIAGNOSIS — M5415 Radiculopathy, thoracolumbar region: Secondary | ICD-10-CM | POA: Diagnosis not present

## 2024-05-08 DIAGNOSIS — M9901 Segmental and somatic dysfunction of cervical region: Secondary | ICD-10-CM | POA: Diagnosis not present

## 2024-05-08 DIAGNOSIS — M5451 Vertebrogenic low back pain: Secondary | ICD-10-CM | POA: Diagnosis not present

## 2024-05-13 DIAGNOSIS — H353132 Nonexudative age-related macular degeneration, bilateral, intermediate dry stage: Secondary | ICD-10-CM | POA: Diagnosis not present

## 2024-05-21 DIAGNOSIS — M542 Cervicalgia: Secondary | ICD-10-CM | POA: Diagnosis not present

## 2024-05-28 ENCOUNTER — Telehealth: Payer: Self-pay | Admitting: Family Medicine

## 2024-05-28 DIAGNOSIS — S0993XA Unspecified injury of face, initial encounter: Secondary | ICD-10-CM | POA: Diagnosis not present

## 2024-05-28 DIAGNOSIS — S0033XA Contusion of nose, initial encounter: Secondary | ICD-10-CM | POA: Diagnosis not present

## 2024-05-28 DIAGNOSIS — W010XXA Fall on same level from slipping, tripping and stumbling without subsequent striking against object, initial encounter: Secondary | ICD-10-CM | POA: Diagnosis not present

## 2024-05-28 DIAGNOSIS — W01198A Fall on same level from slipping, tripping and stumbling with subsequent striking against other object, initial encounter: Secondary | ICD-10-CM | POA: Diagnosis not present

## 2024-05-28 DIAGNOSIS — S0081XA Abrasion of other part of head, initial encounter: Secondary | ICD-10-CM | POA: Diagnosis not present

## 2024-05-28 DIAGNOSIS — S0003XA Contusion of scalp, initial encounter: Secondary | ICD-10-CM | POA: Diagnosis not present

## 2024-05-28 DIAGNOSIS — R229 Localized swelling, mass and lump, unspecified: Secondary | ICD-10-CM | POA: Diagnosis not present

## 2024-05-28 NOTE — Telephone Encounter (Signed)
Lvmtcb and sent mychart message

## 2024-05-28 NOTE — Telephone Encounter (Signed)
 Copied from CRM [id number]. Topic: General - Other >> May 28, 2024  9:02 AM Aleatha BROCKS wrote: Reason for CRM: Corean from [facility] Called to see if patient bone density has been done in [facility]. Pharmacist been looking to get this complete by 11/17 and has not been able to get in contact with patient, the order was put  in at [facility] but again hasn't been able to reach patient she is asking if the Dr can reach out to patient to have her get this done. Corean call back is [phone] ext [phone]

## 2024-05-28 NOTE — Telephone Encounter (Signed)
 Has the order been placed from us  for her to be able to get this done?

## 2024-05-29 NOTE — Telephone Encounter (Signed)
 Lvmtcb, sent mychart & text with below info.

## 2024-05-30 DIAGNOSIS — M5451 Vertebrogenic low back pain: Secondary | ICD-10-CM | POA: Diagnosis not present

## 2024-05-30 DIAGNOSIS — M9901 Segmental and somatic dysfunction of cervical region: Secondary | ICD-10-CM | POA: Diagnosis not present

## 2024-05-30 DIAGNOSIS — M5415 Radiculopathy, thoracolumbar region: Secondary | ICD-10-CM | POA: Diagnosis not present

## 2024-05-30 DIAGNOSIS — M9903 Segmental and somatic dysfunction of lumbar region: Secondary | ICD-10-CM | POA: Diagnosis not present

## 2024-06-05 DIAGNOSIS — M9901 Segmental and somatic dysfunction of cervical region: Secondary | ICD-10-CM | POA: Diagnosis not present

## 2024-06-05 DIAGNOSIS — M5451 Vertebrogenic low back pain: Secondary | ICD-10-CM | POA: Diagnosis not present

## 2024-06-05 DIAGNOSIS — M5415 Radiculopathy, thoracolumbar region: Secondary | ICD-10-CM | POA: Diagnosis not present

## 2024-06-05 DIAGNOSIS — M9903 Segmental and somatic dysfunction of lumbar region: Secondary | ICD-10-CM | POA: Diagnosis not present

## 2024-06-09 ENCOUNTER — Ambulatory Visit

## 2024-06-09 VITALS — BP 118/60 | HR 90 | Temp 98.3°F | Ht <= 58 in | Wt 121.4 lb

## 2024-06-09 DIAGNOSIS — Z0000 Encounter for general adult medical examination without abnormal findings: Secondary | ICD-10-CM | POA: Diagnosis not present

## 2024-06-09 NOTE — Progress Notes (Cosign Needed Addendum)
 Subjective:   Brenda Delgado is a 78 y.o. female who presents for a Medicare Annual Wellness Visit.  Allergies (verified) Pravastatin , Erythromycin, Iohexol , Amoxicillin-pot clavulanate, Codeine, Moxifloxacin, and Sulfamethoxazole   History: Past Medical History:  Diagnosis Date   Allergy    Anxiety    Arthritis    right knee   Cancer (HCC) 07/2019   left breast DCIS   Complication of anesthesia    per pt with surgery 05-14-2019 right facial swelling and lip/ mouth blister's   Constipation    Depression    Environmental and seasonal allergies    Family history of colon cancer    Family history of ovarian cancer    GERD (gastroesophageal reflux disease)    Hiatal hernia    History of kidney stones    Left ureteral stone    Mild asthma    Nephrolithiasis    Nocturia    PONV (postoperative nausea and vomiting)    Pre-diabetes    Renal calculus, left    Wears glasses    Wears hearing aid in both ears    Past Surgical History:  Procedure Laterality Date   ABDOMINAL HYSTERECTOMY     ANTERIOR CERVICAL DECOMP/DISCECTOMY FUSION  01-06-2010   @[facility]    C4--5   BREAST LUMPECTOMY WITH RADIOACTIVE SEED LOCALIZATION Left 08/12/2019   Procedure: LEFT BREAST LUMPECTOMY WITH RADIOACTIVE SEED LOCALIZATION;  Surgeon: Ethyl Lenis, MD;  Location: [facility];  Service: General;  Laterality: Left;   CARPAL TUNNEL RELEASE Right 2000   cataracts  2018   bilateral   CYSTOCELE REPAIR  09/11/2011   Procedure: ANTERIOR REPAIR (CYSTOCELE);  Surgeon: Oneil FORBES Piety, MD;  Location: [facility];  Service: Gynecology;  Laterality: N/A;   CYSTOSCOPY  09/11/2011   Procedure: CYSTOSCOPY;  Surgeon: Oneil FORBES Piety, MD;  Location: [facility];  Service: Gynecology;  Laterality: N/A;   CYSTOSCOPY WITH RETROGRADE PYELOGRAM, URETEROSCOPY AND STENT PLACEMENT Left 05/28/2019   Procedure: CYSTOSCOPY WITH RETROGRADE PYELOGRAM, URETEROSCOPY AND STENT REPLACEMENT;  Surgeon: Alvaro Hummer, MD;  Location: [facility];  Service: Urology;  Laterality: Left;   CYSTOSCOPY WITH STENT PLACEMENT Left 05/14/2019   Procedure: CYSTOSCOPY WITH STENT PLACEMENT, RETROGRADE;  Surgeon: Gaston Hamilton, MD;  Location: [facility];  Service: Urology;  Laterality: Left;   ELBOW SURGERY Right    EXCISION VULVA CYST Bilateral 01-16-2005  @[facility]    HOLMIUM LASER APPLICATION Left 05/28/2019   Procedure: HOLMIUM LASER APPLICATION;  Surgeon: Alvaro Hummer, MD;  Location: [facility];  Service: Urology;  Laterality: Left;   KNEE ARTHROSCOPY Right 2005;    02-11-2013   KNEE SURGERY Right 08-20-2012   @[facility]    peroneal nerve neurolysis proximal tibia/ fibular articular branch    LAPAROSCOPIC CHOLECYSTECTOMY  1990s   SACROSPINOUS LIGAMENT FIXATION  04-05-2017   @[facility]    UPHOLD vaginal mesh and  Anterior Repair   SALPINGOOPHORECTOMY  09/11/2011   Procedure: SALPINGO OOPHERECTOMY;  Surgeon: Oneil FORBES Piety, MD;  Location: [facility];  Service: Gynecology;  Laterality: Bilateral;   SHOULDER SURGERY Right    TONSILLECTOMY     VAGINAL HYSTERECTOMY  09/11/2011   Procedure: HYSTERECTOMY VAGINAL;  Surgeon: Oneil FORBES Piety, MD;  Location: [facility];  Service: Gynecology;  Laterality: N/A;   VULVA MARYBETH BIOPSY  09/11/2011   Procedure: VULVAR BIOPSY;  Surgeon: Oneil FORBES Piety, MD;  Location: [facility];  Service: Gynecology;  Laterality: Left;  sebaceous cyst excision left vula   Family History  Problem Relation Age  of Onset   Skin cancer Mother    Colon cancer Paternal Uncle        dx late 78s-60s   Ovarian cancer Paternal Aunt 58   Cancer Cousin        unk type   Social History   Occupational History   Not on file  Tobacco Use   Smoking status: Never   Smokeless tobacco: Never  Vaping Use   Vaping status: Never Used  Substance and Sexual Activity   Alcohol use: Not Currently    Comment: occasional   Drug use: Never   Sexual activity: Not on file   Tobacco Counseling Counseling given: Not  Answered  SDOH Screenings   Food Insecurity: No Food Insecurity (06/09/2024)  Housing: Unknown (06/09/2024)  Transportation Needs: No Transportation Needs (06/09/2024)  Utilities: Not At Risk (06/09/2024)  Alcohol Screen: Low Risk  (06/09/2024)  Depression (PHQ2-9): Low Risk  (06/09/2024)  Financial Resource Strain: Low Risk  (06/09/2024)  Physical Activity: Insufficiently Active (06/09/2024)  Social Connections: Moderately Integrated (06/09/2024)  Stress: No Stress Concern Present (06/09/2024)  Tobacco Use: Low Risk  (06/09/2024)  Health Literacy: Adequate Health Literacy (06/09/2024)   Depression Screen    06/09/2024   11:11 AM 04/18/2023    8:37 AM 12/12/2022    2:08 PM 12/11/2022    1:42 PM 11/23/2021    1:22 PM 11/23/2021    1:17 PM 11/10/2020    9:24 AM  PHQ 2/9 Scores  PHQ - 2 Score 0 0 0 0 0 0 0  PHQ- 9 Score 1 2     2      Goals Addressed             This Visit's Progress    Patient Stated       06/09/2024, wants to lose weight       Visit info / Clinical Intake: Medicare Wellness Visit Type:: Subsequent Annual Wellness Visit Medicare Wellness Visit Mode:: In-person (required for [facility]) Interpreter Needed?: No Pre-visit prep was completed: yes AWV questionnaire completed by patient prior to visit?: no Living arrangements:: lives with spouse/significant other Patient's Overall Health Status Rating: good Typical amount of pain: some Does pain affect daily life?: no Are you currently prescribed opioids?: no  Dietary Habits and Nutritional Risks How many meals a day?: 4 Eats fruit and vegetables daily?: yes Most meals are obtained by: preparing own meals Diabetic:: no  Functional Status Activities of Daily Living (to include ambulation/medication): Independent Ambulation: Independent Medication Administration: Independent Home Management: Independent Manage your own finances?: yes Primary transportation is: family/friends Concerns about vision?: (!) yes (getting new  glasses) Concerns about hearing?: (!) yes (says hearing aids were worse) Uses hearing aids?: no Hear whispered voice?: yes  Fall Screening Falls in the past year?: 1 (tripped over toy) Number of falls in past year: 0 Was there an injury with Fall?: 1 (two black eye) Fall Risk Category Calculator: 2 Patient Fall Risk Level: Moderate Fall Risk  Fall Risk Patient at Risk for Falls Due to: Impaired vision; Medication side effect Fall risk Follow up: Falls prevention discussed; Falls evaluation completed  Home and Transportation Safety: All rugs have non-skid backing?: yes All stairs or steps have railings?: yes Grab bars in the bathtub or shower?: (!) no Have non-skid surface in bathtub or shower?: (!) no (has strips to put in) Good home lighting?: yes Regular seat belt use?: yes Hospital stays in the last year:: no  Cognitive Assessment Difficulty concentrating, remembering, or making decisions? :  yes (some) Will 6CIT or Mini Cog be Completed: yes What year is it?: 4 points What month is it?: 0 points Give patient an address phrase to remember (5 components): [address] [facility] About what time is it?: 0 points Count backwards from 20 to 1: 0 points Say the months of the year in reverse: 0 points Repeat the address phrase from earlier: 6 points 6 CIT Score: 10 points  Advance Directives (For Healthcare) Does Patient Have a Medical Advance Directive?: Yes Does patient want to make changes to medical advance directive?: No - Patient declined Type of Advance Directive: Healthcare Power of [place]; Living will Copy of Healthcare Power of Attorney in Chart?: No - copy requested Copy of Living Will in Chart?: No - copy requested  Reviewed/Updated  Reviewed/Updated: All        Objective:    Today's Vitals   06/09/24 1047  BP: 118/60  Pulse: 90  Temp: 98.3 F (36.8 C)  TempSrc: Oral  SpO2: 96%  Weight: 121 lb 6.4 oz (55.1 kg)  Height: 4' 10 (1.473 m)   Body  mass index is 25.37 kg/m.  Current Medications (verified) Outpatient Encounter Medications as of 06/09/2024  Medication Sig   ALBUTEROL IN Inhale into the lungs as needed.   Apoaequorin (PREVAGEN PO) Take by mouth.   Ascorbic Acid (VITAMIN C) 1000 MG tablet Take 1,000 mg by mouth daily.   aspirin EC 81 MG tablet Take 81 mg by mouth daily.   Calcium-Vitamin D-Vitamin K 500-500-40 MG-UNT-MCG CHEW Chew 2 tablets by mouth daily.   Cholecalciferol (VITAMIN D3) 50 MCG (2000 UT) TABS Take 50 mcg by mouth 2 (two) times daily.   Coenzyme Q10 (COQ10 PO) Take 1 capsule by mouth daily.   EPINEPHrine  0.3 mg/0.3 mL IJ SOAJ injection SMARTSIG:1 Pre-Filled Pen Syringe IM As Needed   meloxicam (MOBIC) 15 MG tablet Take 15 mg by mouth daily as needed for pain.   metFORMIN  (GLUCOPHAGE -XR) 500 MG 24 hr tablet TAKE 1 TABLET BY MOUTH AT BEDTIME   mometasone  (NASONEX ) 50 MCG/ACT nasal spray Place 2 sprays into the nose daily. (Patient taking differently: Place 2 sprays into the nose as needed.)   Multiple Vitamins-Minerals (OCUVITE PO) Take by mouth.   omega-3 acid ethyl esters (LOVAZA) 1 g capsule Take by mouth 2 (two) times daily.   pantoprazole  (PROTONIX ) 40 MG tablet TAKE 1 TABLET BY MOUTH EVERY DAY AT BEDTIME   PARoxetine  (PAXIL ) 20 MG tablet Take 1 tablet by mouth once daily   tamoxifen  (NOLVADEX ) 10 MG tablet Take 1 tablet (10 mg total) by mouth daily.   Turmeric 400 MG CAPS Take by mouth.   zinc gluconate 50 MG tablet Take 50 mg by mouth daily.   mometasone  (ASMANEX ) 220 MCG/ACT inhaler Inhale 2 puffs into the lungs daily. (Patient not taking: Reported on 06/09/2024)   No facility-administered encounter medications on file as of 06/09/2024.   Hearing/Vision screen Hearing Screening - Comments:: States hearing aids made worse Vision Screening - Comments:: Regular eye exams, [employer] Immunizations and Health Maintenance Health Maintenance  Topic Date Due   COVID-19 Vaccine (6 - 2025-26  season) 04/07/2024   Medicare Annual Wellness (AWV)  06/09/2025   Pneumococcal Vaccine: 50+ Years  Completed   Influenza Vaccine  Completed   DEXA SCAN  Completed   Zoster Vaccines- Shingrix  Completed   Meningococcal B Vaccine  Aged Out   DTaP/Tdap/Td  Discontinued   Mammogram  Discontinued   Colonoscopy  Discontinued   Hepatitis C Screening  Discontinued        Assessment/Plan:  This is a routine wellness examination for Michell.  Patient Care Team: Berneta Elsie Sayre, MD as PCP - General (Family Medicine) Tyree Nanetta SAILOR, RN as Oncology Nurse Navigator Ethyl Lenis, MD as Consulting Physician (General Surgery) Odean Potts, MD as Consulting Physician (Hematology and Oncology) Izell Domino, MD as Attending Physician (Radiation Oncology)  I have personally reviewed and noted the following in the patient's chart:   Medical and social history Use of alcohol, tobacco or illicit drugs  Current medications and supplements including opioid prescriptions. Functional ability and status Nutritional status Physical activity Advanced directives List of other physicians Hospitalizations, surgeries, and ER visits in previous 12 months Vitals Screenings to include cognitive, depression, and falls Referrals and appointments  No orders of the defined types were placed in this encounter.  In addition, I have reviewed and discussed with patient certain preventive protocols, quality metrics, and best practice recommendations. A written personalized care plan for preventive services as well as general preventive health recommendations were provided to patient.   Ardella FORBES Dawn, LPN   [id number]   Return in 1 year (on 06/09/2025).  After Visit Summary: (In Person-Printed) AVS printed and given to the patient  Nurse Notes: none

## 2024-06-09 NOTE — Patient Instructions (Addendum)
 Brenda Delgado,  Thank you for taking the time for your Medicare Wellness Visit. I appreciate your continued commitment to your health goals. Please review the care plan we discussed, and feel free to reach out if I can assist you further.  Please note that Annual Wellness Visits do not include a physical exam. Some assessments may be limited, especially if the visit was conducted virtually. If needed, we may recommend an in-person follow-up with your provider.  Ongoing Care Seeing your primary care provider every 3 to 6 months helps us  monitor your health and provide consistent, personalized care.   Referrals If a referral was made during today's visit and you haven't received any updates within two weeks, please contact the referred provider directly to check on the status.  Recommended Screenings:  Health Maintenance  Topic Date Due   COVID-19 Vaccine (6 - 2025-26 season) 04/07/2024   Medicare Annual Wellness Visit  06/09/2025   Pneumococcal Vaccine for age over 64  Completed   Flu Shot  Completed   DEXA scan (bone density measurement)  Completed   Zoster (Shingles) Vaccine  Completed   Meningitis B Vaccine  Aged Out   DTaP/Tdap/Td vaccine  Discontinued   Breast Cancer Screening  Discontinued   Colon Cancer Screening  Discontinued   Hepatitis C Screening  Discontinued       06/09/2024   10:56 AM  Advanced Directives  Does Patient Have a Medical Advance Directive? Yes  Type of Estate Agent of [place];Living will  Copy of Healthcare Power of Attorney in Chart? No - copy requested    Vision: Annual vision screenings are recommended for early detection of glaucoma, cataracts, and diabetic retinopathy. These exams can also reveal signs of chronic conditions such as diabetes and high blood pressure.  Dental: Annual dental screenings help detect early signs of oral cancer, gum disease, and other conditions linked to overall health, including heart disease and  diabetes.  Please see the attached documents for additional preventive care recommendations.

## 2024-06-10 ENCOUNTER — Other Ambulatory Visit: Payer: Self-pay | Admitting: Family Medicine

## 2024-06-10 DIAGNOSIS — F418 Other specified anxiety disorders: Secondary | ICD-10-CM

## 2024-06-12 DIAGNOSIS — H353132 Nonexudative age-related macular degeneration, bilateral, intermediate dry stage: Secondary | ICD-10-CM | POA: Diagnosis not present

## 2024-06-18 DIAGNOSIS — M542 Cervicalgia: Secondary | ICD-10-CM | POA: Diagnosis not present

## 2024-06-18 DIAGNOSIS — M25512 Pain in left shoulder: Secondary | ICD-10-CM | POA: Diagnosis not present

## 2024-06-23 ENCOUNTER — Ambulatory Visit (HOSPITAL_BASED_OUTPATIENT_CLINIC_OR_DEPARTMENT_OTHER)
Admission: RE | Admit: 2024-06-23 | Discharge: 2024-06-23 | Disposition: A | Discharge status: Home / Self Care | Source: Ambulatory Visit | Attending: Family Medicine | Admitting: Family Medicine

## 2024-06-23 DIAGNOSIS — Z780 Asymptomatic menopausal state: Secondary | ICD-10-CM | POA: Insufficient documentation

## 2024-06-25 ENCOUNTER — Other Ambulatory Visit: Payer: Self-pay | Admitting: Family Medicine

## 2024-06-25 DIAGNOSIS — K219 Gastro-esophageal reflux disease without esophagitis: Secondary | ICD-10-CM

## 2024-06-29 ENCOUNTER — Ambulatory Visit: Payer: Self-pay | Admitting: Family Medicine

## 2024-07-01 DIAGNOSIS — Z1231 Encounter for screening mammogram for malignant neoplasm of breast: Secondary | ICD-10-CM | POA: Diagnosis not present

## 2024-07-01 LAB — HM MAMMOGRAPHY

## 2024-07-02 ENCOUNTER — Other Ambulatory Visit: Payer: Self-pay

## 2024-07-02 DIAGNOSIS — D0512 Intraductal carcinoma in situ of left breast: Secondary | ICD-10-CM

## 2024-07-02 NOTE — Progress Notes (Signed)
 Received screening mammogram from [facility] showing grouped calcifications in left breast indeterminate, needing additional views. Placed orders for diagnostic L breast mammogram and L breast ultrasound. Successfully faxed to [facility]. RN attempt x1 to contact pt, no answer, left VM for pt to return call.

## 2024-07-10 DIAGNOSIS — M9903 Segmental and somatic dysfunction of lumbar region: Secondary | ICD-10-CM | POA: Diagnosis not present

## 2024-07-10 DIAGNOSIS — M5415 Radiculopathy, thoracolumbar region: Secondary | ICD-10-CM | POA: Diagnosis not present

## 2024-07-10 DIAGNOSIS — M5451 Vertebrogenic low back pain: Secondary | ICD-10-CM | POA: Diagnosis not present

## 2024-07-10 DIAGNOSIS — M9901 Segmental and somatic dysfunction of cervical region: Secondary | ICD-10-CM | POA: Diagnosis not present

## 2024-07-12 DIAGNOSIS — H353132 Nonexudative age-related macular degeneration, bilateral, intermediate dry stage: Secondary | ICD-10-CM | POA: Diagnosis not present

## 2024-07-29 ENCOUNTER — Other Ambulatory Visit: Payer: Self-pay | Admitting: Radiology

## 2024-07-30 LAB — SURGICAL PATHOLOGY

## 2024-08-13 ENCOUNTER — Encounter: Payer: Self-pay | Admitting: Family Medicine

## 2024-08-14 ENCOUNTER — Telehealth: Payer: Self-pay

## 2024-08-14 NOTE — Telephone Encounter (Signed)
 Copied from CRM [id number]. Topic: Clinical - Request for Lab/Test Order >> Aug 13, 2024  1:44 PM Rea C wrote: Reason for CRM: Patient's husband called in and would like to know if Patient needs to fast for January 9th @ 8:20 appointment. No labs are in chart.    [id number] (M)

## 2024-08-14 NOTE — Telephone Encounter (Signed)
 Left VM to come in fasting for labs. Dm/cma

## 2024-08-15 ENCOUNTER — Ambulatory Visit: Payer: Self-pay | Admitting: Family Medicine

## 2024-08-15 ENCOUNTER — Telehealth: Payer: Self-pay

## 2024-08-15 ENCOUNTER — Ambulatory Visit: Admitting: Family Medicine

## 2024-08-15 ENCOUNTER — Encounter: Payer: Self-pay | Admitting: Family Medicine

## 2024-08-15 VITALS — BP 124/70 | HR 90 | Temp 97.9°F | Ht <= 58 in | Wt 120.0 lb

## 2024-08-15 DIAGNOSIS — F418 Other specified anxiety disorders: Secondary | ICD-10-CM

## 2024-08-15 DIAGNOSIS — E559 Vitamin D deficiency, unspecified: Secondary | ICD-10-CM

## 2024-08-15 DIAGNOSIS — E7800 Pure hypercholesterolemia, unspecified: Secondary | ICD-10-CM

## 2024-08-15 DIAGNOSIS — R7303 Prediabetes: Secondary | ICD-10-CM | POA: Diagnosis not present

## 2024-08-15 LAB — CBC WITH DIFFERENTIAL/PLATELET
Basophils Absolute: 0 K/uL (ref 0.0–0.1)
Basophils Relative: 1.2 % (ref 0.0–3.0)
Eosinophils Absolute: 0.1 K/uL (ref 0.0–0.7)
Eosinophils Relative: 2 % (ref 0.0–5.0)
HCT: 43.7 % (ref 36.0–46.0)
Hemoglobin: 14.5 g/dL (ref 12.0–15.0)
Lymphocytes Relative: 33.6 % (ref 12.0–46.0)
Lymphs Abs: 1.2 K/uL (ref 0.7–4.0)
MCHC: 33.3 g/dL (ref 30.0–36.0)
MCV: 94.1 fl (ref 78.0–100.0)
Monocytes Absolute: 0.4 K/uL (ref 0.1–1.0)
Monocytes Relative: 10.9 % (ref 3.0–12.0)
Neutro Abs: 1.9 K/uL (ref 1.4–7.7)
Neutrophils Relative %: 52.3 % (ref 43.0–77.0)
Platelets: 265 K/uL (ref 150.0–400.0)
RBC: 4.64 Mil/uL (ref 3.87–5.11)
RDW: 13.8 % (ref 11.5–15.5)
WBC: 3.7 K/uL — ABNORMAL LOW (ref 4.0–10.5)

## 2024-08-15 LAB — URINALYSIS, ROUTINE W REFLEX MICROSCOPIC
Bilirubin Urine: NEGATIVE
Hgb urine dipstick: NEGATIVE
Ketones, ur: NEGATIVE
Leukocytes,Ua: NEGATIVE
Nitrite: NEGATIVE
Specific Gravity, Urine: 1.015 (ref 1.000–1.030)
Total Protein, Urine: NEGATIVE
Urine Glucose: NEGATIVE
Urobilinogen, UA: 0.2 (ref 0.0–1.0)
pH: 7 (ref 5.0–8.0)

## 2024-08-15 LAB — LIPID PANEL
Cholesterol: 173 mg/dL (ref 28–200)
HDL: 67.6 mg/dL
LDL Cholesterol: 90 mg/dL (ref 10–99)
NonHDL: 105.51
Total CHOL/HDL Ratio: 3
Triglycerides: 76 mg/dL (ref 10.0–149.0)
VLDL: 15.2 mg/dL (ref 0.0–40.0)

## 2024-08-15 LAB — COMPREHENSIVE METABOLIC PANEL WITH GFR
ALT: 15 U/L (ref 3–35)
AST: 21 U/L (ref 5–37)
Albumin: 3.9 g/dL (ref 3.5–5.2)
Alkaline Phosphatase: 45 U/L (ref 39–117)
BUN: 18 mg/dL (ref 6–23)
CO2: 32 meq/L (ref 19–32)
Calcium: 9 mg/dL (ref 8.4–10.5)
Chloride: 105 meq/L (ref 96–112)
Creatinine, Ser: 0.6 mg/dL (ref 0.40–1.20)
GFR: 86.25 mL/min
Glucose, Bld: 92 mg/dL (ref 70–99)
Potassium: 4.6 meq/L (ref 3.5–5.1)
Sodium: 141 meq/L (ref 135–145)
Total Bilirubin: 0.5 mg/dL (ref 0.2–1.2)
Total Protein: 6.1 g/dL (ref 6.0–8.3)

## 2024-08-15 LAB — TSH: TSH: 2.06 u[IU]/mL (ref 0.35–5.50)

## 2024-08-15 LAB — HEMOGLOBIN A1C: Hgb A1c MFr Bld: 5.7 % (ref 4.6–6.5)

## 2024-08-15 LAB — VITAMIN D 25 HYDROXY (VIT D DEFICIENCY, FRACTURES): VITD: 106.97 ng/mL (ref 30.00–100.00)

## 2024-08-15 MED ORDER — PAROXETINE HCL 20 MG PO TABS: 20.0000 mg | ORAL_TABLET | Freq: Every day | ORAL | 0 refills | Status: AC

## 2024-08-15 NOTE — Progress Notes (Signed)
 "  Established Patient Office Visit   Subjective:  Patient ID: Brenda Delgado, female    DOB: Jan 29, 1946  Age: 79 y.o. MRN: [id number]  Chief Complaint  Patient presents with   Medical Management of Chronic Issues    HPI Encounter Diagnoses  Name Primary?   Prediabetes Yes   Elevated LDL cholesterol level    Depression with anxiety    Vitamin D  deficiency    For follow-up of above issues accompanied by her husband Muise.  Doing well.  Recent breast biopsy was negative.  Continues with metformin  for prediabetes.  Paxil  continues to work well for depression and anxiety.  Continues to work puzzles and read to fend off cognitive decline.  Not interested in medications.   Review of Systems  Constitutional: Negative.   HENT: Negative.    Eyes:  Negative for blurred vision, discharge and redness.  Respiratory: Negative.    Cardiovascular: Negative.   Gastrointestinal:  Negative for abdominal pain.  Genitourinary: Negative.   Musculoskeletal: Negative.  Negative for myalgias.  Skin:  Negative for rash.  Neurological:  Positive for tremors. Negative for tingling, loss of consciousness and weakness.  Endo/Heme/Allergies:  Negative for polydipsia.  Psychiatric/Behavioral:  Negative for depression. The patient is not nervous/anxious.     Current Medications[1]   Objective:     BP 124/70   Pulse 90   Temp 97.9 F (36.6 C)   Ht 4' 10 (1.473 m)   Wt 120 lb (54.4 kg)   SpO2 96%   BMI 25.08 kg/m    Physical Exam Constitutional:      General: She is not in acute distress.    Appearance: Normal appearance. She is not ill-appearing, toxic-appearing or diaphoretic.  HENT:     Head: Normocephalic and atraumatic.     Right Ear: Tympanic membrane, ear canal and external ear normal.     Left Ear: Tympanic membrane, ear canal and external ear normal.     Mouth/Throat:     Mouth: Mucous membranes are moist.     Pharynx: Oropharynx is clear. No oropharyngeal exudate or posterior  oropharyngeal erythema.  Eyes:     General: No scleral icterus.       Right eye: No discharge.        Left eye: No discharge.     Extraocular Movements: Extraocular movements intact.     Conjunctiva/sclera: Conjunctivae normal.     Pupils: Pupils are equal, round, and reactive to light.  Cardiovascular:     Rate and Rhythm: Normal rate and regular rhythm.  Pulmonary:     Effort: Pulmonary effort is normal. No respiratory distress.     Breath sounds: Normal breath sounds. No wheezing or rales.  Abdominal:     General: Bowel sounds are normal.  Musculoskeletal:     Cervical back: No rigidity or tenderness.  Skin:    General: Skin is warm and dry.  Neurological:     Mental Status: She is alert and oriented to person, place, and time.  Psychiatric:        Mood and Affect: Mood normal.        Behavior: Behavior normal.      No results found for any visits on 08/15/24.    The 10-year ASCVD risk score (Arnett DK, et al., 2019) is: 18.8%    Assessment & Plan:   Prediabetes -     Comprehensive metabolic panel with GFR -     Urinalysis, Routine w reflex microscopic -  Hemoglobin A1c  Elevated LDL cholesterol level -     Comprehensive metabolic panel with GFR -     Lipid panel  Depression with anxiety -     PARoxetine  HCl; Take 1 tablet (20 mg total) by mouth daily.  Dispense: 90 tablet; Refill: 0 -     CBC with Differential/Platelet -     TSH  Vitamin D  deficiency -     VITAMIN D  25 Hydroxy (Vit-D Deficiency, Fractures)    Return in about 6 months (around 02/12/2025).  Continue current medications.  Could consider statin.  Adjustments made pending results of labs.  Discussed starting donepezil but they would like to hold off for now.  Elsie Sim Lent, MD    [1]  Current Outpatient Medications:    ALBUTEROL IN, Inhale into the lungs as needed., Disp: , Rfl:    Apoaequorin (PREVAGEN PO), Take by mouth., Disp: , Rfl:    Ascorbic Acid (VITAMIN C) 1000 MG  tablet, Take 1,000 mg by mouth daily., Disp: , Rfl:    aspirin EC 81 MG tablet, Take 81 mg by mouth daily., Disp: , Rfl:    Calcium-Vitamin D -Vitamin K 500-500-40 MG-UNT-MCG CHEW, Chew 2 tablets by mouth daily., Disp: , Rfl:    Cholecalciferol (VITAMIN D3) 50 MCG (2000 UT) TABS, Take 50 mcg by mouth 2 (two) times daily., Disp: , Rfl:    Coenzyme Q10 (COQ10 PO), Take 1 capsule by mouth daily., Disp: , Rfl:    EPINEPHrine  0.3 mg/0.3 mL IJ SOAJ injection, SMARTSIG:1 Pre-Filled Pen Syringe IM As Needed, Disp: , Rfl:    meloxicam (MOBIC) 15 MG tablet, Take 15 mg by mouth daily as needed for pain., Disp: , Rfl:    metFORMIN  (GLUCOPHAGE -XR) 500 MG 24 hr tablet, TAKE 1 TABLET BY MOUTH AT BEDTIME, Disp: 90 tablet, Rfl: 0   mometasone  (NASONEX ) 50 MCG/ACT nasal spray, Place 2 sprays into the nose daily. (Patient taking differently: Place 2 sprays into the nose as needed.), Disp: 1 each, Rfl: 12   Multiple Vitamins-Minerals (OCUVITE PO), Take by mouth., Disp: , Rfl:    omega-3 acid ethyl esters (LOVAZA) 1 g capsule, Take by mouth 2 (two) times daily., Disp: , Rfl:    pantoprazole  (PROTONIX ) 40 MG tablet, TAKE 1 TABLET BY MOUTH ONCE DAILY AT BEDTIME, Disp: 90 tablet, Rfl: 0   tamoxifen  (NOLVADEX ) 10 MG tablet, Take 1 tablet (10 mg total) by mouth daily., Disp: 90 tablet, Rfl: 3   Turmeric 400 MG CAPS, Take by mouth. (Patient taking differently: Take by mouth. Patient is taking 1000 mg daily), Disp: , Rfl:    zinc gluconate 50 MG tablet, Take 50 mg by mouth daily., Disp: , Rfl:    mometasone  (ASMANEX ) 220 MCG/ACT inhaler, Inhale 2 puffs into the lungs daily. (Patient not taking: Reported on 08/15/2024), Disp: 1 each, Rfl: 5   PARoxetine  (PAXIL ) 20 MG tablet, Take 1 tablet (20 mg total) by mouth daily., Disp: 90 tablet, Rfl: 0  "

## 2024-08-15 NOTE — Progress Notes (Signed)
 Cardiology Outpatient Office Note        Name: Destiny Osborne  MRN:  [id number]      History Of Present Illness  Destiny Osborne is a 79 y.o. female presenting for a routine F/U. Her hx is significant for CAD, AFIB, Palps, HTN and Hyperlipidemia. Ms. Sample reports she has been well since her last visit. She denies any cardiac complaints and her BP is stable. No recent ER visits or surgeries.      Past Medical History:  She has a past medical history of Arrhythmia, Arthritis, Atrial fibrillation (CMS/HCC) (HCC), Bigeminal rhythm, Bilateral carotid bruits, Carotid artery occlusion, Chest pain, Coronary artery disease, Disease of thyroid gland, Esophageal reflux, Heart murmur, Hyperlipidemia, Hypertension, Hypothyroidism, IBS (irritable bowel syndrome), Meniere's disease, Non-rheumatic mitral regurgitation, Palpitations, Paroxysmal atrial fibrillation (CMS/HCC) (HCC), Paroxysmal atrial tachycardia (HCC), Premature ventricular contractions, and Sinus bradycardia.    She has no past medical history of Deep vein thrombosis (CMS/HCC) (HCC).    Surgical History  She has a past surgical history that includes CT angiogram heart coronary (10/24/2021); Total knee arthroplasty (Right, 08/2018); Total abdominal hysterectomy w/ bilateral salpingoophorectomy (Bilateral); Urethral sling; Cystocele repair; Coronary artery bypass graft (father); and Carotid endarterectomy (father).      Family History   Her family history includes Angina in her father; Arrhythmia in her sister, sister, sister, sister, sister, sister, sister, and sister; Atrial fibrillation in her father, mother's sister, mother's sister, mother's sister, mother's sister, sister, sister, sister, sister, and sister; CABG in her father; Cancer in her mother's sister, mother's sister, mother's sister, and mother's sister; Coronary artery disease in her father; DEFIBRILLATOR in her father; HLD in her father; HTN in her father; Heart attack in her father; Heart disease in her  father; Heart failure in her father; Stroke in her father.     Social History  She reports that she has never smoked. She has never been exposed to tobacco smoke. She has never used smokeless tobacco. She reports that she does not currently use alcohol. She reports that she does not use drugs.    Allergies  Cefdinir, Cephalosporins, Alendronate, Oseltamivir, Tramadol, Vancomycin, and Tizanidine    Medications  Current Outpatient Medications   Medication Instructions    amiodarone (PACERONE) 200 mg, Oral, Daily    b complex vitamins capsule 1 capsule, Daily    Dyazide 37.5-25 MG capsule 1 capsule, Every morning    Eliquis 5 mg, Oral, 2 times daily    Eliquis 5 mg, Oral, 2 times daily    famotidine (PEPCID) 20 mg, Daily    isosorbide mononitrate ER (IMDUR) 30 mg, Daily    levothyroxine (TIROSINT) 88 mcg, Daily before breakfast    Lifitegrast (Xiidra) 5 % solution Every 12 hours    LORazepam (Ativan) 1 MG tablet Every 12 hours    methenamine hippurate (Hiprex) 1 g tablet TAKE ONE TABLET (1 G TOTAL) BY MOUTH 2 (TWO) TIMES A DAY Oral for 90 Days    metoprolol succinate XL (TOPROL-XL) 25 mg, Oral, Every 24 hours    Multiple Vitamins-Minerals (PRESERVISION AREDS 2 PO) Take by mouth.    pantoprazole  (PROTONIX ) 40 mg, Daily before breakfast    VITAMIN D PO Take by mouth.       Review of Systems  As noted in HPI     Objective  Vitals-Last 24 Hours  Heart Rate:  [61] 61  BP: (148-160)/(80) 148/80      Physical Exam:  Constitutional: General appearance: Normal.   Neck: Examination of  the neck: Normal.   Pulmonary: Assessment of respiratory effort: Normal. Auscultation of lungs: Normal.   Cardiovascular: Auscultation of heart: Normal. The heart rate was normal. The rhythm was regular. Heart sounds: normal S1,S2. No murmurs were heard. Examination of extremities for edema. Normal. Carotid pulses: Normal.   Musculoskeletal: Examination of gait and station: Normal. Assessment of stability: Normal.   Skin: Inspection  of skin and subcutaneous tissue: Normal.   Psychiatric: Description of patient's judgement and insight: Normal. Orientation to person, place and time: Normal. Mood and affect: Normal.           Assessment & Plan    CAD  - clinically stable, no cardiac complaints.   - 3/23: CTA coronaries: L-main 0, 1st DIAG 50%, L-CX 50-69%, RCA 50%.  - 7/24: Negative NST, nml EF  - continue ASA, BB and statin    Atrial fibrillation  - clinically stable, rate and rhythm controlled  - 7/24: Echo, EF: 65-70%, nml WM  - continue Amio, BB and Eliquis  - monitored by Dr. Robina    Hypertension  - b/p stable, home BP readings at goal, continue meds  - continue to monitor b/p and keep b/p log.    Hyperlipidemia  - on Atorvastatin 20 mg, continue diet/ex  - 7/24: TC 145, Ldl 52, HDL 44, Trigs 112      Follow-up in 6 months with Dr. Neysa and call with any cardiac-related questions or concerns.       Myrick Savannah, NP  [facility]

## 2024-08-15 NOTE — Telephone Encounter (Signed)
 CRITICAL VALUE STICKER  CRITICAL VALUE: Vit d 106.97  RECEIVER (on-site recipient of call): Brenda Delgado   DATE & TIME NOTIFIED: 08/15/24 1118  MESSENGER (representative from lab): Hope  MD NOTIFIED: Berneta  TIME OF NOTIFICATION: 1118  RESPONSE:  Provider aware

## 2024-08-29 NOTE — Progress Notes (Signed)
 Clinical Cardiac Electrophysiology - Progress Note    Name:  Destiny Osborne  MRN:  [id number]  DOB:  Apr 24, 1946    EP History:  Destiny Osborne has a relevant history of PAF, CAD, HTN, here for f/u.  Recent MCOT monitor in October, sinus rhythm 44-107, avg 58 bpm, symptoms with sinus rhythm, sinus bradycardia, rare afib.  Today, feels well.  BP elevated on arrival, reports checks regularly, usually in 120s, but just got off phone with [employer] and was upset.     Medical History:  Patient Active Problem List   Diagnosis    Abnormal cardiovascular function study    Premature ventricular contractions    Bilateral carotid bruits    Hyperlipidemia    Hypertension    Paroxysmal atrial fibrillation (CMS/HCC) (HCC)    Palpitations    Non-rheumatic mitral regurgitation    Never smoked tobacco    Murmur    Esophageal reflux    Bilateral swelling of feet    CAD (coronary artery disease)    Abnormal electrocardiogram    Thyroid disease    Osteoarthritis    Abscess    Acquired deformity of nose    Acquired hypothyroidism    Anxiety    Dysphagia    Irritable bowel syndrome, unspecified    Drug-induced constipation    Cystocele, midline    Benzodiazepine dependence (HCC)    Basal cell carcinoma of ala nasi    Hyperglycemia    History of malignant neoplasm of skin    Heartburn    Generalized osteoarthritis    Encounter for screening for malignant neoplasm of colon    Other specified malignant neoplasm of overlapping sites of skin    Nausea    Memory loss    Lower abdominal pain, unspecified    Left lower quadrant pain    Abdominal pain    Recurrent cystitis    Pure hypercholesterolemia    Prediabetes    Postmenopausal osteoporosis    Postmenopausal atrophic vaginitis    Pancolitis    Temporomandibular joint disorder    Right upper quadrant pain    Residual hemorrhoidal skin tags    Chronic GERD    Hiatal hernia    Other specified disease of esophagus    Essential hypertension    Mixed  hyperlipidemia    Osteoporosis    Irritable bowel syndrome    High blood pressure    GERD (gastroesophageal reflux disease)    Atrial fibrillation and flutter    Other specified diseases of intestine       Current Medications & Allergies:  Current Outpatient Medications   Medication Instructions    amiodarone (PACERONE) 200 mg, Oral, Daily    b complex vitamins capsule 1 capsule, Daily    Dyazide 37.5-25 MG capsule 1 capsule, Every morning    Eliquis 5 mg, Oral, 2 times daily    Eliquis 5 mg, Oral, 2 times daily    famotidine (PEPCID) 20 mg, Daily    isosorbide mononitrate ER (IMDUR) 30 mg, Daily    levothyroxine (TIROSINT) 88 mcg, Daily before breakfast    Lifitegrast (Xiidra) 5 % solution Every 12 hours    LORazepam (Ativan) 1 MG tablet Every 12 hours    methenamine hippurate (Hiprex) 1 g tablet TAKE ONE TABLET (1 G TOTAL) BY MOUTH 2 (TWO) TIMES A DAY Oral for 90 Days    metoprolol succinate XL (TOPROL-XL) 25 mg, Oral, Every 24 hours    Multiple Vitamins-Minerals (PRESERVISION AREDS 2 PO) Take by  mouth.    pantoprazole  (PROTONIX ) 40 mg, Daily before breakfast    VITAMIN D PO Take by mouth.     Allergies   Allergen Reactions    Cefdinir Shortness of breath    Cephalosporins Hives and Shortness of breath     Other Reaction(s): SOB, rash    Alendronate GI intolerance    Oseltamivir Unknown    Tramadol Unknown    Vancomycin     Tizanidine Rash       Physical Exam:  BP 161/72 (BP Location: Left upper arm, Patient Position: Sitting, BP Cuff Size: Adult)   Pulse 67   Ht 1.651 m (5' 5)   Wt 68.9 kg (152 lb)   SpO2 98%   BMI 25.29 kg/m       Alert and oriented; NAO   Rhythm regular, rate regular    Respirations non-labored CTA  GI non-distended   Extremities warm   Neuro: non-focal    Ancillary Data (Independently reviewed & personally interpreted for this encounter)  ECG: sinus rhythm 61 bpm, normal axis Qtc 436 ms       Review of previously ordered lab results or tests:  Lab Results    Component Value Date    GLUCOSE 79 04/03/2023    CALCIUM 9.3 04/03/2023    CO2 23 02/27/2023    CL 105 04/03/2023    BUN 24 (H) 02/27/2023    CREATININE 0.96 04/03/2023       Lab Results   Component Value Date    WBC 7.2 04/03/2023    HGB 12.2 04/03/2023    HCT 38.2 04/03/2023    MCV 95.5 04/03/2023    PLT 214 04/03/2023           Assessment & Plan   PAF: continue amiodarone, metoprolol and Eliquis.  Rhythm stable.  Labs 07/21/24 with Dr. Greg, LFTS, TSH satisfactory. Reviewed holter monitor, no need for PPM at this time.          RTC 6 m  or sooner as needed.      Avelina Lerner, FNP-C  Clinical Cardiac Electrophysiology      Copy: Candis Rosaline Greg, MD

## 2024-09-04 ENCOUNTER — Emergency Department (HOSPITAL_COMMUNITY)

## 2024-09-04 ENCOUNTER — Emergency Department
Admission: EM | Admit: 2024-09-04 | Discharge: 2024-09-04 | Disposition: A | Discharge status: Home / Self Care | Attending: Student in an Organized Health Care Education/Training Program | Admitting: Student in an Organized Health Care Education/Training Program

## 2024-09-04 DIAGNOSIS — R0789 Other chest pain: Secondary | ICD-10-CM

## 2024-09-04 DIAGNOSIS — R079 Chest pain, unspecified: Secondary | ICD-10-CM

## 2024-09-04 DIAGNOSIS — R42 Dizziness and giddiness: Secondary | ICD-10-CM

## 2024-09-04 DIAGNOSIS — R001 Bradycardia, unspecified: Secondary | ICD-10-CM

## 2024-09-04 DIAGNOSIS — R110 Nausea: Secondary | ICD-10-CM

## 2024-09-04 LAB — CBC, DIFF
% Basophils: 1 %
% Eosinophils: 2 %
% Immature Granulocytes: 0 %
% Lymphocytes: 27 %
% Monocytes: 10 %
% Neutrophils: 60 %
% Nucleated RBC: 0 %
Absolute Eosinophil Count: 0.13 10*3/uL (ref 0.00–0.50)
Absolute Lymphocyte Count: 2.03 10*3/uL (ref 1.00–4.80)
Basophils: 0.04 10*3/uL (ref 0.00–0.20)
Hematocrit: 38 % (ref 36.0–45.0)
Hemoglobin: 12.4 g/dL (ref 11.5–15.5)
Immature Granulocytes: 0.02 10*3/uL (ref 0.00–0.05)
MCH: 31.2 pg (ref 27.3–33.6)
MCHC: 32.6 g/dL (ref 32.2–36.5)
MCV: 96 fL (ref 81–98)
Monocytes: 0.72 10*3/uL (ref 0.00–0.80)
Neutrophils: 4.65 10*3/uL (ref 1.80–7.00)
Nucleated RBC: 0 10*3/uL
Platelet Count: 215 10*3/uL (ref 150–400)
RBC: 3.98 10*6/uL (ref 3.80–5.00)
RDW-CV: 13.4 % (ref 11.0–14.5)
WBC: 7.59 10*3/uL (ref 4.3–10.0)

## 2024-09-04 LAB — EKG 12 LEAD
Atrial Rate: 56 {beats}/min
Atrial Rate: 52 {beats}/min
P Axis: 64 degrees
P Axis: 51 degrees
P-R Interval: 204 ms
P-R Interval: 178 ms
Q-T Interval: 448 ms
Q-T Interval: 468 ms
QRS Duration: 78 ms
QRS Duration: 84 ms
QTC Calculation: 432 ms
QTC Calculation: 435 ms
R Axis: 22 degrees
R Axis: 0 degrees
T Axis: 55 degrees
T Axis: 17 degrees
Ventricular Rate: 56 {beats}/min
Ventricular Rate: 52 {beats}/min

## 2024-09-04 LAB — TROPONIN_I
Troponin_I Interpretation: NORMAL
Troponin_I Interpretation: NORMAL
Troponin_I: <0.03 ng/mL (ref <0.04)
Troponin_I: <0.03 ng/mL (ref <0.04)

## 2024-09-04 LAB — COMPREHENSIVE METABOLIC PANEL
ALT (Beckman): 21 U/L (ref 7–33)
AST (Beckman): 24 U/L (ref 9–38)
Albumin: 4.3 g/dL (ref 3.5–5.2)
Alkaline Phosphatase (Total): 75 U/L (ref 49–199)
Anion Gap: 6 (ref 4–12)
Calcium: 9.7 mg/dL (ref 8.9–10.2)
Carbon Dioxide, Total: 29 meq/L (ref 22–32)
Chloride: 105 meq/L (ref 98–108)
Creatinine: 0.94 mg/dL (ref 0.38–1.02)
Glucose: 87 mg/dL (ref 62–125)
Potassium: 4.3 meq/L (ref 3.6–5.2)
Sodium: 140 meq/L (ref 135–145)
Total Bilirubin (Beckman): 0.4 mg/dL (ref 0.2–1.3)
Total Protein (Beckman): 7.1 g/dL (ref 6.0–8.2)
Urea Nitrogen: 25 mg/dL — ABNORMAL HIGH (ref 8–21)
eGFR by CKD-EPI 2021: >60 mL/min/{1.73_m2} (ref >59)

## 2024-09-04 LAB — B_TYPE NATRIURETIC PEPTIDE: B_Type Natriuretic Peptide: 104 pg/mL — ABNORMAL HIGH (ref <101)

## 2024-09-04 LAB — MAGNESIUM: Magnesium: 1.9 mg/dL (ref 1.8–2.4)

## 2024-09-04 LAB — LIPASE: Lipase: 67 U/L (ref <70)

## 2024-09-04 LAB — 1ST EXTRA GOLD TOP

## 2024-09-04 LAB — 1ST EXTRA BLUE TOP

## 2024-09-04 MED ORDER — ALUM & MAG HYDROXIDE-SIMETH 200-200-20 MG/5ML OR SUSP
15.0000 mL | Freq: Once | ORAL | Status: AC
  Administered 2024-09-04: 15 mL via ORAL
  Filled 2024-09-04: qty 15

## 2024-09-04 MED ORDER — LIDOCAINE VISCOUS HCL 2 % MT SOLN
15.0000 mL | Freq: Once | OROMUCOSAL | Status: AC
  Administered 2024-09-04: 15 mL via ORAL
  Filled 2024-09-04: qty 15

## 2024-09-04 MED ORDER — PANTOPRAZOLE SODIUM 40 MG IV SOLR
40.0000 mg | Freq: Once | INTRAVENOUS | Status: AC
  Administered 2024-09-04: 40 mg via INTRAVENOUS
  Filled 2024-09-04: qty 10

## 2024-09-04 MED ORDER — LACTATED RINGERS BOLUS
1000.0000 mL | Freq: Once | INTRAVENOUS | Status: AC
  Administered 2024-09-04: 1000 mL via INTRAVENOUS

## 2024-09-04 MED ORDER — ONDANSETRON HCL 4 MG/2ML IJ SOLN
8.0000 mg | Freq: Once | INTRAMUSCULAR | Status: AC
  Administered 2024-09-04: 8 mg via INTRAVENOUS
  Filled 2024-09-04: qty 4

## 2024-09-04 NOTE — ED Provider Notes (Signed)
 CHIEF COMPLAINT   Chief Complaint   Patient presents with    Chest Pain            HISTORY OF PRESENT ILLNESS AND REVIEW OF SYSTEMS        79 year old female with past medical history significant for atrial fibrillation on amiodarone and DOAC, coronary disease, hypertension, GERD, hiatal hernia, hyperlipidemia, among others presenting the emergency department following earlier episode of nausea, lightheadedness and upper abdominal and chest pressure that radiated into her jaw.  Patient states that she has been in normal state of health over recent days including this morning when she woke up.  At around 10 AM patient had sudden onset of lightheadedness as if she was going to pass out along with associated nausea with subsequent onset upper abdominal and chest tightness that radiated to jaw.  Symptoms lasted for several hours before largely resolving at time of my initial evaluation.  Patient denies any ongoing symptoms other than mild nausea and upset stomach at time of my initial evaluation in room.  Denies any ongoing chest pain and denies any associated shortness of breath, palpitations with earlier episodes.  Patient reports compliance with her blood thinner.  No leg swelling, cough, fever, sore throat, nasal congestion.  Patient had consumed breakfast prior to onset of symptoms this morning              PAST MEDICAL AND SURGICAL HISTORY   Medical History[1]    Surgical History[2]       MEDICATIONS AND ALLERGIES     OUTPATIENT MEDICATIONS:   Current Outpatient Medications   Medication Instructions    dilTIAZem ER 180 mg, Oral    isosorbide mononitrate ER (IMDUR) 30 mg, Oral    lansoprazole 30 mg, Oral    levothyroxine (SYNTHROID; LEVOXYL) 75 mcg, Oral    metoprolol succinate ER (TOPROL XL) 50 mg, Oral    ondansetron  (ZOFRAN  ODT) 4 mg, Oral, Every 12 hours PRN    rosuvastatin (CRESTOR) 5 mg, Oral       ALLERGIES:   Cefdinir, Cephalosporins, Vancomycin, and Tramadol              SOCIAL HISTORY AND FAMILY  HISTORY        Family History       No data available                  PHYSICAL EXAM   ED VITALS:  Vitals (Arrival)      T: 36.2 C (09/04/24 1249)  BP: (!) 170/71 (09/04/24 1249)  HR: (!) 57 (09/04/24 1249)  RR: 17 (09/04/24 1249)  SpO2: 99 % (09/04/24 1249) Room air   Vitals (Most recent in last 24 hrs)   T: 36.2 C (09/04/24 1249)  BP: (!) 144/54 (09/04/24 1700)  HR: (!) 52 (09/04/24 1700)  RR: 16 (09/04/24 1700)  SpO2: 97 % (09/04/24 1700) Room air  T range: Temp  Min: 36.2 C  Max: 36.2 C  (no weight taken for this visit)     (no height taken for this visit)     There is no height or weight on file to calculate BMI.       Physical Exam    General- Lying in bed, appearing in acute distress  HEENT- PERRLA, white sclera   Cardiovascular - RRR, warm and well perfused  Lungs - Breathing comfortably on room air, no audible stridor, no increased WOB  Abdomen - abdomen soft and nontender, entirely benign anterior-abdominal exam  Back-No  midline tenderness  Extremities - No edema, DP and radial pulses 2+  Skin-Warm and dry  Musculoskeletal - No deformity, no swollen or erythematous joints.  Neurological- Alert and oriented x 3, 5/5 strength in all extremities, intact sensation to light touch in all extremities.  No focal deficit      LABORATORY:   Labs Reviewed   COMPREHENSIVE METABOLIC PANEL - Abnormal       Result Value    Sodium 140      Potassium 4.3      Chloride 105      Carbon Dioxide, Total 29      Anion Gap 6      Glucose 87      Urea Nitrogen 25 (*)     Creatinine 0.94      Total Protein (Beckman) 7.1      Albumin 4.3      Total Bilirubin (Beckman) 0.4      Calcium 9.7      AST (Beckman) 24      Alkaline Phosphatase (Total) 75      ALT (Beckman) 21      eGFR by CKD-EPI 2021 >60     B_TYPE NATRIURETIC PEPTIDE - Abnormal    B_Type Natriuretic Peptide 104 (*)    CBC, DIFF    WBC 7.59      RBC 3.98      Hemoglobin 12.4      Hematocrit 38      MCV 96      MCH 31.2      MCHC 32.6      Platelet Count 215      RDW-CV  13.4      % Neutrophils 60      % Lymphocytes 27      % Monocytes 10      % Eosinophils 2      % Basophils 1      % Immature Granulocytes 0      Neutrophils 4.65      Absolute Lymphocyte Count 2.03      Monocytes 0.72      Absolute Eosinophil Count 0.13      Basophils 0.04      Immature Granulocytes 0.02      Nucleated RBC 0.00      % Nucleated RBC 0     LIPASE    Lipase 67     TROPONIN_I    Troponin_I <0.03      Troponin_I Interpretation Normal     MAGNESIUM    Magnesium 1.9     TROPONIN_I    Troponin_I <0.03      Troponin_I Interpretation Normal           IMAGING:     ED Wet Read -   XR Chest 2 Vw   Final Result   Lungs: Hyperlucency of the lung apices suggests underlying emphysema. Mild linear atelectasis or scarring is seen at the lingula. Otherwise, lungs are clear.      Pleura: No effusion. No pneumothorax.      Heart and mediastinum: Unremarkable.      Bones: No acute or suspicious abnormality.                   Radiology Final Result -   No image results found.              EKG DOCUMENTATION                 SUICIDE RISK EVALUATION  SEPSIS               ED COURSE/MEDICAL DECISION MAKING        79 year old female with past medical history significant for atrial fibrillation on amiodarone and DOAC, coronary disease, hypertension, GERD, hiatal hernia, hyperlipidemia, among others presenting the emergency department following earlier episode of nausea, lightheadedness and upper abdominal and chest pressure that radiated into her jaw.  Moderate hypertension along with mild bradycardia with otherwise normal vitals throughout emergency department course.  Patient overall well-appearing, normal cardiopulmonary exam, no evidence of volume overload or VTE with benign abdominal exam.  Patient asymptomatic on initial evaluation however later did have some nausea and upset stomach which fully resolved following GI cocktail and Protonix  following which she remained asymptomatic.  ECG shows sinus bradycardia  without evidence of heart block or ischemia, unchanged on repeat and with negative troponin x 2 very low suspicion for ACS with higher suspicion for primary underlying GI etiology for her earlier symptoms.  Remainder of labs also ordered and independently interpreted myself without any significant derangements including normal CBC, CMP, lipase, magnesium.  Chest x-ray without evidence of cardiomegaly, pneumonia, pneumothorax or other abnormalities.  Benign repeat assessments with patient remaining asymptomatic and feeling comfortable discharging, expresses understanding of strict cardiac return precautions.  ED Course as of 09/04/24 2142   Thu Sep 04, 2024   1358 EKG 12-Lead  56 bpm, normal sinus rhythm, normal intervals, no clear ST segment changes or T wave inversions  My impression: Mild sinus bradycardia without evidence of associated heart block, arrhythmia, ischemia/STEMI [WC]   1359 Pulse(!): 57  While patient's heart rates are slightly bradycardic on review of her chart she has had prior heart rates for many years around 60, this appears to be her baseline [WC]   1602 EKG 12-Lead  52 bpm, normal sinus rhythm, normal intervals, no clear ST segment changes or T wave inversions  My impression: Sinus pericardia without evidence of heart block, ischemia, STEMI, unchanged from initial [WC]   1646 Patient asymptomatic without any ongoing chest pain reporting that all of her symptoms are resolved following pantoprazole  and GI cocktail.  High suspicion for underlying GI etiology to symptoms.  Will discharge with very strict cardiac return precautions [WC]      ED Course User Index  [WC] Destiny Elsie SAUNDERS, MD                                         Medications Given in the ED:   Medications   ondansetron  (Zofran ) injection 8 mg (8 mg Intravenous Given 09/04/24 1412)   lactated ringers  IV Bolus 1,000 mL (0 mL Intravenous Stopped 09/04/24 1440)   pantoprazole  (Protonix ) injection 40 mg (40 mg Intravenous Given 09/04/24  1435)   lidocaine  viscous (Xylocaine ) 2 % oral topical solution 15 mL (15 mL Oral Given 09/04/24 1435)     And   aluminum & magnesium hydroxide-simethicone  (Maalox Plus) 200-200-20 MG/5ML suspension 15 mL (15 mL Oral Given 09/04/24 1435)              CLINICAL IMPRESSION AND DISPOSITION (Link)     Clinical Impressions:   [R07.9] Chest pain, unspecified type   [R11.0] Nausea        No follow-up provider specified.    Patient was given scripts for the following medications.  Discharge Medication List as of 09/04/2024  5:08 PM  Disposition: Discharge        CRITICAL CARE/ADDITIONAL INFORMATION REVIEWED ATTENDING ONLY   Critical Care - No Critical Care                  [1] No past medical history on file.  [2] No past surgical history on file.       Destiny Elsie SAUNDERS, MD  09/04/24 2145

## 2024-09-04 NOTE — Discharge Instructions (Signed)
 Your evaluated the emergency department following earlier episode of lightheadedness, chest pain, nausea and other symptoms.  Your symptoms significantly improved following administration of medications to soothe your esophagus and stomach suggesting possible underlying gastrointestinal etiology for your symptoms.  Your cardiac workup today was overall very reassuring without evidence of acute injury to your heart however if you develop any exertional chest pain please have very low threshold to return for reevaluation.

## 2024-09-04 NOTE — ED Triage Notes (Signed)
 Pt ambulatory to triage. Pt reports dizziness, nausea, and chest tightness that radiated to her jaw starting this morning. Pt reports currently lightheaded and nausea, no CP/SOB.  PMH: PAF/CAD

## 2025-01-13 ENCOUNTER — Ambulatory Visit: Admitting: Hematology and Oncology

## 2025-02-13 ENCOUNTER — Ambulatory Visit: Admitting: Family Medicine

## 2025-06-15 ENCOUNTER — Ambulatory Visit

## 7363-11-06 DEATH — deceased
